# Patient Record
Sex: Male | Born: 1945 | Race: Black or African American | Hispanic: No | Marital: Married | Smoking: Former smoker
Health system: Southern US, Community
[De-identification: ages and names within clinical notes are randomized; demographics above are authoritative.]

## PROBLEM LIST (undated history)

## (undated) DIAGNOSIS — IMO0002 Reserved for concepts with insufficient information to code with codable children: Secondary | ICD-10-CM

## (undated) DIAGNOSIS — F0391 Unspecified dementia with behavioral disturbance: Secondary | ICD-10-CM

## (undated) DIAGNOSIS — I10 Essential (primary) hypertension: Secondary | ICD-10-CM

## (undated) DIAGNOSIS — F03918 Unspecified dementia, unspecified severity, with other behavioral disturbance: Secondary | ICD-10-CM

## (undated) DIAGNOSIS — R1310 Dysphagia, unspecified: Secondary | ICD-10-CM

## (undated) DIAGNOSIS — G609 Hereditary and idiopathic neuropathy, unspecified: Secondary | ICD-10-CM

## (undated) DIAGNOSIS — R270 Ataxia, unspecified: Secondary | ICD-10-CM

## (undated) DIAGNOSIS — R569 Unspecified convulsions: Secondary | ICD-10-CM

## (undated) DIAGNOSIS — Z9989 Dependence on other enabling machines and devices: Secondary | ICD-10-CM

## (undated) DIAGNOSIS — I25810 Atherosclerosis of coronary artery bypass graft(s) without angina pectoris: Secondary | ICD-10-CM

## (undated) DIAGNOSIS — G4733 Obstructive sleep apnea (adult) (pediatric): Secondary | ICD-10-CM

## (undated) DIAGNOSIS — R531 Weakness: Secondary | ICD-10-CM

## (undated) DIAGNOSIS — I639 Cerebral infarction, unspecified: Secondary | ICD-10-CM

## (undated) DIAGNOSIS — I219 Acute myocardial infarction, unspecified: Secondary | ICD-10-CM

## (undated) DIAGNOSIS — R413 Other amnesia: Secondary | ICD-10-CM

## (undated) DIAGNOSIS — E538 Deficiency of other specified B group vitamins: Secondary | ICD-10-CM

## (undated) DIAGNOSIS — G40309 Generalized idiopathic epilepsy and epileptic syndromes, not intractable, without status epilepticus: Secondary | ICD-10-CM

## (undated) DIAGNOSIS — I4891 Unspecified atrial fibrillation: Secondary | ICD-10-CM

## (undated) HISTORY — DX: Other amnesia: R41.3

## (undated) HISTORY — DX: Ataxia, unspecified: R27.0

## (undated) HISTORY — DX: Atherosclerosis of coronary artery bypass graft(s) without angina pectoris: I25.810

## (undated) HISTORY — DX: Reserved for concepts with insufficient information to code with codable children: IMO0002

## (undated) HISTORY — DX: Hereditary and idiopathic neuropathy, unspecified: G60.9

## (undated) HISTORY — DX: Deficiency of other specified B group vitamins: E53.8

## (undated) HISTORY — PX: CORONARY ARTERY BYPASS GRAFT: SHX141

## (undated) HISTORY — DX: Essential (primary) hypertension: I10

## (undated) HISTORY — DX: Generalized idiopathic epilepsy and epileptic syndromes, not intractable, without status epilepticus: G40.309

## (undated) HISTORY — PX: OTHER SURGICAL HISTORY: SHX169

---

## 1998-02-14 ENCOUNTER — Encounter: Admission: RE | Admit: 1998-02-14 | Discharge: 1998-05-15 | Payer: Self-pay | Admitting: Psychiatry

## 1998-05-08 ENCOUNTER — Emergency Department (HOSPITAL_COMMUNITY): Admission: EM | Admit: 1998-05-08 | Discharge: 1998-05-08 | Payer: Self-pay | Admitting: Emergency Medicine

## 1998-09-02 ENCOUNTER — Emergency Department (HOSPITAL_COMMUNITY): Admission: EM | Admit: 1998-09-02 | Discharge: 1998-09-02 | Payer: Self-pay | Admitting: Emergency Medicine

## 2000-05-17 ENCOUNTER — Emergency Department (HOSPITAL_COMMUNITY): Admission: EM | Admit: 2000-05-17 | Discharge: 2000-05-17 | Payer: Self-pay | Admitting: Emergency Medicine

## 2000-06-08 ENCOUNTER — Encounter: Payer: Self-pay | Admitting: Neurosurgery

## 2000-06-10 ENCOUNTER — Inpatient Hospital Stay (HOSPITAL_COMMUNITY): Admission: RE | Admit: 2000-06-10 | Discharge: 2000-06-11 | Payer: Self-pay | Admitting: Neurosurgery

## 2000-06-10 ENCOUNTER — Encounter: Payer: Self-pay | Admitting: Neurosurgery

## 2000-09-15 ENCOUNTER — Encounter: Payer: Self-pay | Admitting: Cardiology

## 2000-09-15 ENCOUNTER — Encounter: Admission: RE | Admit: 2000-09-15 | Discharge: 2000-09-15 | Payer: Self-pay | Admitting: Cardiology

## 2000-09-19 ENCOUNTER — Ambulatory Visit (HOSPITAL_COMMUNITY): Admission: RE | Admit: 2000-09-19 | Discharge: 2000-09-19 | Payer: Self-pay | Admitting: Cardiology

## 2000-09-30 ENCOUNTER — Ambulatory Visit (HOSPITAL_COMMUNITY): Admission: RE | Admit: 2000-09-30 | Discharge: 2000-09-30 | Payer: Self-pay | Admitting: Surgery

## 2000-10-03 ENCOUNTER — Encounter: Payer: Self-pay | Admitting: Surgery

## 2000-10-03 ENCOUNTER — Ambulatory Visit (HOSPITAL_COMMUNITY): Admission: RE | Admit: 2000-10-03 | Discharge: 2000-10-03 | Payer: Self-pay | Admitting: Surgery

## 2000-10-05 ENCOUNTER — Encounter: Payer: Self-pay | Admitting: Surgery

## 2000-10-05 ENCOUNTER — Inpatient Hospital Stay (HOSPITAL_COMMUNITY): Admission: RE | Admit: 2000-10-05 | Discharge: 2000-10-11 | Payer: Self-pay | Admitting: Surgery

## 2000-10-06 ENCOUNTER — Encounter: Payer: Self-pay | Admitting: Surgery

## 2000-10-07 ENCOUNTER — Encounter: Payer: Self-pay | Admitting: Surgery

## 2000-10-31 ENCOUNTER — Encounter: Admission: RE | Admit: 2000-10-31 | Discharge: 2000-10-31 | Payer: Self-pay | Admitting: Cardiology

## 2000-10-31 ENCOUNTER — Encounter: Payer: Self-pay | Admitting: Cardiology

## 2001-01-16 ENCOUNTER — Encounter (INDEPENDENT_AMBULATORY_CARE_PROVIDER_SITE_OTHER): Payer: Self-pay

## 2001-01-16 ENCOUNTER — Ambulatory Visit (HOSPITAL_COMMUNITY): Admission: RE | Admit: 2001-01-16 | Discharge: 2001-01-16 | Payer: Self-pay | Admitting: Gastroenterology

## 2003-05-19 ENCOUNTER — Encounter: Payer: Self-pay | Admitting: Emergency Medicine

## 2003-05-19 ENCOUNTER — Emergency Department (HOSPITAL_COMMUNITY): Admission: EM | Admit: 2003-05-19 | Discharge: 2003-05-19 | Payer: Self-pay | Admitting: Emergency Medicine

## 2003-05-29 ENCOUNTER — Encounter: Admission: RE | Admit: 2003-05-29 | Discharge: 2003-05-29 | Payer: Self-pay | Admitting: Neurology

## 2003-05-29 ENCOUNTER — Encounter: Payer: Self-pay | Admitting: Neurology

## 2003-07-09 ENCOUNTER — Encounter: Admission: RE | Admit: 2003-07-09 | Discharge: 2003-08-14 | Payer: Self-pay | Admitting: Neurosurgery

## 2003-08-20 ENCOUNTER — Encounter: Admission: RE | Admit: 2003-08-20 | Discharge: 2003-11-18 | Payer: Self-pay | Admitting: Neurosurgery

## 2004-05-30 ENCOUNTER — Ambulatory Visit (HOSPITAL_COMMUNITY): Admission: RE | Admit: 2004-05-30 | Discharge: 2004-05-30 | Payer: Self-pay | Admitting: Neurosurgery

## 2005-06-05 ENCOUNTER — Ambulatory Visit (HOSPITAL_COMMUNITY): Admission: RE | Admit: 2005-06-05 | Discharge: 2005-06-05 | Payer: Self-pay | Admitting: Neurosurgery

## 2005-08-19 ENCOUNTER — Emergency Department (HOSPITAL_COMMUNITY): Admission: EM | Admit: 2005-08-19 | Discharge: 2005-08-19 | Payer: Self-pay | Admitting: *Deleted

## 2006-01-03 ENCOUNTER — Ambulatory Visit (HOSPITAL_COMMUNITY): Admission: RE | Admit: 2006-01-03 | Discharge: 2006-01-03 | Payer: Self-pay | Admitting: Neurosurgery

## 2006-06-16 ENCOUNTER — Ambulatory Visit: Payer: Self-pay | Admitting: Family Medicine

## 2007-02-02 ENCOUNTER — Ambulatory Visit: Payer: Self-pay | Admitting: Family Medicine

## 2007-08-24 ENCOUNTER — Ambulatory Visit: Payer: Self-pay | Admitting: Family Medicine

## 2008-04-11 ENCOUNTER — Ambulatory Visit: Payer: Self-pay | Admitting: Family Medicine

## 2008-05-13 ENCOUNTER — Ambulatory Visit: Payer: Self-pay | Admitting: Family Medicine

## 2008-06-24 ENCOUNTER — Ambulatory Visit: Payer: Self-pay | Admitting: Family Medicine

## 2008-11-18 ENCOUNTER — Ambulatory Visit: Payer: Self-pay | Admitting: Family Medicine

## 2008-11-19 ENCOUNTER — Encounter: Admission: RE | Admit: 2008-11-19 | Discharge: 2008-11-19 | Payer: Self-pay | Admitting: Neurology

## 2009-06-17 ENCOUNTER — Ambulatory Visit: Payer: Self-pay | Admitting: Family Medicine

## 2009-07-25 ENCOUNTER — Ambulatory Visit: Payer: Self-pay | Admitting: Family Medicine

## 2010-08-01 ENCOUNTER — Inpatient Hospital Stay (HOSPITAL_COMMUNITY): Admission: EM | Admit: 2010-08-01 | Discharge: 2010-08-07 | Payer: Self-pay | Source: Home / Self Care

## 2010-09-05 ENCOUNTER — Encounter: Payer: Self-pay | Admitting: Neurosurgery

## 2010-10-26 LAB — PHENYTOIN LEVEL, TOTAL
Phenytoin Lvl: 17.2 ug/mL (ref 10.0–20.0)
Phenytoin Lvl: 5.6 ug/mL — ABNORMAL LOW (ref 10.0–20.0)

## 2010-10-26 LAB — BASIC METABOLIC PANEL
BUN: 11 mg/dL (ref 6–23)
BUN: 18 mg/dL (ref 6–23)
BUN: 6 mg/dL (ref 6–23)
BUN: 7 mg/dL (ref 6–23)
BUN: 8 mg/dL (ref 6–23)
CO2: 19 mEq/L (ref 19–32)
CO2: 22 mEq/L (ref 19–32)
CO2: 24 mEq/L (ref 19–32)
CO2: 24 mEq/L (ref 19–32)
CO2: 26 mEq/L (ref 19–32)
Calcium: 8.4 mg/dL (ref 8.4–10.5)
Calcium: 8.6 mg/dL (ref 8.4–10.5)
Calcium: 8.8 mg/dL (ref 8.4–10.5)
Calcium: 9.2 mg/dL (ref 8.4–10.5)
Calcium: 9.6 mg/dL (ref 8.4–10.5)
Chloride: 105 mEq/L (ref 96–112)
Chloride: 105 mEq/L (ref 96–112)
Chloride: 106 mEq/L (ref 96–112)
Chloride: 108 mEq/L (ref 96–112)
Chloride: 108 mEq/L (ref 96–112)
Creatinine, Ser: 0.82 mg/dL (ref 0.4–1.5)
Creatinine, Ser: 0.85 mg/dL (ref 0.4–1.5)
Creatinine, Ser: 0.86 mg/dL (ref 0.4–1.5)
Creatinine, Ser: 0.89 mg/dL (ref 0.4–1.5)
Creatinine, Ser: 1 mg/dL (ref 0.4–1.5)
GFR calc Af Amer: >60 mL/min (ref >60)
GFR calc Af Amer: >60 mL/min (ref >60)
GFR calc Af Amer: >60 mL/min (ref >60)
GFR calc Af Amer: >60 mL/min (ref >60)
GFR calc Af Amer: >60 mL/min (ref >60)
GFR calc non Af Amer: >60 mL/min (ref >60)
GFR calc non Af Amer: >60 mL/min (ref >60)
GFR calc non Af Amer: >60 mL/min (ref >60)
GFR calc non Af Amer: >60 mL/min (ref >60)
GFR calc non Af Amer: >60 mL/min (ref >60)
Glucose, Bld: 104 mg/dL — ABNORMAL HIGH (ref 70–99)
Glucose, Bld: 110 mg/dL — ABNORMAL HIGH (ref 70–99)
Glucose, Bld: 135 mg/dL — ABNORMAL HIGH (ref 70–99)
Glucose, Bld: 164 mg/dL — ABNORMAL HIGH (ref 70–99)
Glucose, Bld: 92 mg/dL (ref 70–99)
Potassium: 3.3 mEq/L — ABNORMAL LOW (ref 3.5–5.1)
Potassium: 3.5 mEq/L (ref 3.5–5.1)
Potassium: 3.7 mEq/L (ref 3.5–5.1)
Potassium: 3.7 mEq/L (ref 3.5–5.1)
Potassium: 3.9 mEq/L (ref 3.5–5.1)
Sodium: 135 mEq/L (ref 135–145)
Sodium: 135 mEq/L (ref 135–145)
Sodium: 137 mEq/L (ref 135–145)
Sodium: 138 mEq/L (ref 135–145)
Sodium: 140 mEq/L (ref 135–145)

## 2010-10-26 LAB — LIPID PANEL
Cholesterol: 169 mg/dL (ref 0–200)
HDL: 44 mg/dL (ref >39)
LDL Cholesterol: 106 mg/dL — ABNORMAL HIGH (ref 0–99)
Total CHOL/HDL Ratio: 3.8 RATIO
Triglycerides: 93 mg/dL (ref <150)
VLDL: 19 mg/dL (ref 0–40)

## 2010-10-26 LAB — PHENOBARBITAL LEVEL: Phenobarbital: 23 ug/mL (ref 15.0–40.0)

## 2010-10-26 LAB — CBC
HCT: 40.8 % (ref 39.0–52.0)
HCT: 40.9 % (ref 39.0–52.0)
Hemoglobin: 13.7 g/dL (ref 13.0–17.0)
Hemoglobin: 14 g/dL (ref 13.0–17.0)
MCH: 30.5 pg (ref 26.0–34.0)
MCH: 30.9 pg (ref 26.0–34.0)
MCHC: 33.5 g/dL (ref 30.0–36.0)
MCHC: 34.3 g/dL (ref 30.0–36.0)
MCV: 90.1 fL (ref 78.0–100.0)
MCV: 91.1 fL (ref 78.0–100.0)
Platelets: 173 10*3/uL (ref 150–400)
Platelets: 185 10*3/uL (ref 150–400)
RBC: 4.49 MIL/uL (ref 4.22–5.81)
RBC: 4.53 MIL/uL (ref 4.22–5.81)
RDW: 13.5 % (ref 11.5–15.5)
RDW: 13.5 % (ref 11.5–15.5)
WBC: 8.7 10*3/uL (ref 4.0–10.5)
WBC: 9.7 10*3/uL (ref 4.0–10.5)

## 2010-10-26 LAB — CK TOTAL AND CKMB (NOT AT ARMC)
CK, MB: 1.4 ng/mL (ref 0.3–4.0)
Relative Index: INVALID (ref 0.0–2.5)
Total CK: 76 U/L (ref 7–232)

## 2010-10-26 LAB — TSH: TSH: 1.327 u[IU]/mL (ref 0.350–4.500)

## 2010-10-26 LAB — TROPONIN I: Troponin I: 0.01 ng/mL (ref 0.00–0.06)

## 2011-01-01 NOTE — Op Note (Signed)
[place]. [facility]  Patient:    [name], [name]                      MRN: [id number] Proc. Date: 10/05/00 Adm. Date:  [id number] Attending:  Cleatrice Burke CC:         [name]. Little, M.D.  Cath lab   Operative Report  PREOPERATIVE DIAGNOSIS:  Three-vessel coronary artery disease with positive Cardiolite stress test.  POSTOPERATIVE DIAGNOSIS:  Three-vessel coronary artery disease with positive Cardiolite stress test.  OPERATION PERFORMED:  Median sternotomy, extracorporeal circulation, coronary artery bypass graft surgery x 5 using a left internal mammary artery graft to the left anterior descending coronary artery, with a saphenous vein graft to the diagonal branch of the left anterior descending, a sequential saphenous vein graft to the posterior descending coronary artery and the posterolateral branch of the right coronary artery, and a saphenous vein graft to the obtuse marginal branch of the left circumflex coronary artery.  SURGEON:  Alleen Borne, M.D.  ASSISTANT:  Lissa Hoard, P.A.  ANESTHESIA:  General endotracheal.  INDICATIONS FOR PROCEDURE:  The patient is a 65 year old gentleman with a history of severe hypertension, hypercholesterolemia, a positive family history of heart disease, and heavy active smoking who had had a previous history of stroke in 1995 and two small strokes in 1999 that were felt to be of hypertensive origin.  He was referred for cardiologic evaluation because of an abnormal electrocardiogram and increasing dyspnea on exertion.  A Cardiolite stress test showed mild lateral ischemia.  His ejection fraction was normal. He underwent cardiac catheterization on September 19, 2000 by [name]. Little, M.D. which showed three-vessel coronary artery disease.  The LAD had 70% proximal stenosis at the take-off of the first diagonal branch.  The left circumflex was a small nondominant vessel with two obtuse  marginal vessels that were small and diffusely diseased.  The right coronary artery had an anomolous take off from the aorta.  This was a dominant vessel that had 80% proximal stenosis over a long area and then a 70% midvessel stenosis.  The posterior descending branch was occluded and filled by collaterals from teh left.  The posterolateral branch was diffusely diseased.  Left ventricular ejection fraction was 60%.  There was no mitral regurgitation.  There was mild dilatation of the aortic root and trace aortic insufficiency.  A preoperative echocardiogram was obtained and this showed some left ventricular hypertrophy but no significant aortic valvular stenosis or insufficiency.  There was no mitral regurgitation.  Preoperative pulmonary function testing was also obtained which showed moderate COPD.  After review of all these studies and examination of the patient it was felt that coronary artery bypass graft surgery was the best treatment.  I discussed the operative procedure with the patient and his wife including alternatives to surgery, benefits, and risks including bleeding, possible blood transfusion, infection, stroke, myocardial infarction, and death.  They understood and agreed to proceed with surgery.  DESCRIPTION OF PROCEDURE:  The patient was taken to the operating room and placed on the table in supine position.  After induction of general endotracheal anesthesia, a Foley catheter was placed in the bladder using sterile technique.  Then the chest, abdomen and both lower extremities were prepped and draped in the usual sterile manner.  The chest was entered through a median sternotomy incision and the pericardium opened in the midline. Examination of the heart showed good ventricular contractility.  The ascending  aorta had no palpable plaques in it.  Then the left internal mammary artery was harvested from the chest wall as a pedicle graft.  This was a medium-caliber vessel  with excellent blood flow through it.  At the same time a segment of greater saphenous vein was harvested from the right leg and this vein was of medium size and good quality.  Then the patient was heparinized and when an adequate activated clotting time was achieved, the distal ascending aorta was cannulated using a 6.5 mm aortic cannula for arterial inflow.  Venous outflow was achieved using a two-stage venous cannula through the right atrial appendage.  An antegrade cardioplegia and vent cannula was inserted into the aortic root.  The patient was placed on cardiopulmonary bypass and the distal coronary arteries were identified.  He had diffuse three-vessel coronary artery disease with plaque extending out into the distal vessels.  Then the aorta was cross-clamped and 500 cc of cold blood antegrade cardioplegia was administered in the aortic root with quick arrest of the heart.  Systemic hypothermia to 20 degrees centigrade and topical hypothermia with iced saline was used.  A temperature probe was placed in the septum and an insulating pad in the pericardium.  The first distal anastomosis was performed to the posterior descending branch of the right coronary artery.  The internal diameter was about about 1.5 mm. The artery was diffusely diseased especially in its proximal and midportions and had to be grafted quite distally.  The conduit used was a segment of greater saphenous vein.  Anastomosis was performed in end-to-side manner using   continuous 7-0 Prolene suture.  The flow was measured through the graft and was excellent.  The second distal anastomosis was performed to the posterolateral branch. The internal diameter was about 1.6 mm.  The conduit used was a the same segment of greater saphenous vein.  The anastomosis was performed in a sequential end-to-side manner using continuous 7-0 Prolene suture.  This vessel was also grafted quite distally due to proximal and midvessel  plaque.  The flow was  measured through the graft and was excellent.  Then another dose of cardioplegia was given down the vein graft and into the aortic root.  The third distal anastomosis was performed to the obtuse marginal branch.  The internal diameter in this area was about 1.5 mm.  The conduit used was a second segment of the greater saphenous vein.  The anastomosis was performed in an end-to-side manner using continuous 7-0 Prolene suture.  The flow was measured through the graft and was excellent.  The fourth distal anastomosis was performed to the diagonal branch.  The internal diameter was 1.6 mm.  The conduit used was the third segment of greater saphenous vein.  The anastomosis was performed in an end-to-side manner using continuous 7-0 Prolene suture.  The flow was measured through the graft and was excellent.  Then another dose of cardioplegia was given down the vein grafts and in the aortic root.  The fifth distal anastomosis was performed to the distal portion of the left anterior descending coronary artery.  The internal diameter was about 1.75 mm. The conduit used was the left internal mammary artery graft and this was brought through an opening in the left pericardium anterior to the phrenic nerve.  It was anastomosed to the LAD in end-to-side manner using continuous 8-0 Prolene suture.  The pedicle was tacked to the epicardium with 6-0 Prolene sutures.  The patient was rewarmed to 37 degrees  and the clamp removed from the mammary pedicle.  There was rapid warming of the ventricular septum and return of spontaneous ventricular fibrillation.  The crossclamp was removed with a time of 58 minutes and the patient defibrillated into sinus rhythm.  A partial occlusion clamp was placed on the aortic root and the three proximal vein graft anastomoses were performed in end-to-side manner using continuous 6-0 Prolene suture.  The clamp was removed and the vein grafts  deaired and the clamps removed from them.  The proximal and distal anastomoses appeared hemostatic and line of the grafts satisfactory.  Graft markers were placed around the proximal anastomoses.  Two temporary right ventricular and right atrial pacing wires were placed and brought out through the skin.  When the patient had rewarmed to 37 degrees centigrade, he was weaned from cardiopulmonary bypass on no inotropic agents.  Total bypass time was 102 minutes.  Cardiac function appeared excellent with a cardiac output of 5L per minute.  Protamine was given and the venous and aortic cannulas were removed without difficulty.  Hemostasis was achieved.  Three chest tubes were placed with a tube in the posterior pericardium and one in the left pleural space and one in the anterior mediastinum.  The pericardium was reapproximated over the heart.  The sternum was closed with #6 stainless steel wires.  The fascia was closed with continuous #1 Vicryl suture.  The subcutaneous tissues were closed using continuous 2-0 Vicryl and the skin with 3-0 Vicryl subcuticular closure.  The lower extremity vein harvest site was closed in layers in a similar manner.  The sponge, needle and instrument counts were correct according to the scrub nurse.  A dry sterile dressing was applied over the incisions and around the chest tubes which were hooked to Pleur-Evac suction.  The patient remained hemodynamically stable and was transported to the SICU in guarded but stable condition. DD:  10/05/00 TD:  10/05/00 Job: [id number]

## 2011-01-01 NOTE — H&P (Signed)
NAMECELVIN, TANEY NO.:  [id number]   MEDICAL RECORD NO.:  [id number]          PATIENT TYPE:  INP   LOCATION:  0104                         FACILITY:  [facility]   PHYSICIAN:  Danae Chen, M.D.DATE OF BIRTH:  07/30/1946   DATE OF ADMISSION:  08/19/2005  DATE OF DISCHARGE:                                HISTORY & PHYSICAL   PRIMARY CARE PHYSICIAN:  Dr. Sharlot Gowda   PRIMARY CARDIOLOGIST:  Dr. Caprice Kluver   CHIEF COMPLAINT:  Fever and shortness of breath.   HISTORY OF PRESENT ILLNESS:  The patient is a 65 year old gentleman with  multiple medical problems who is brought in by his wife for complaints of  fever and shortness of breath and increasing dyspnea on exertion over the  past 3-4 days. The patient currently says that he does not feel badly but  does report that he has had some mildly-productive cough. He denies any  chest pain, no nausea, no vomiting, no recent weight gain or weight loss, no  headache, no chills.   PAST MEDICAL HISTORY:  Significant for coronary artery disease status post  CABG, COPD, congestive heart failure, peripheral vascular disease status  post CVA. Prior surgeries include back surgery and CABG as noted. Also has  dementia and history of seizure disorder.   SOCIAL HISTORY:  He is married. Denies any recent tobacco or alcohol use.   FAMILY HISTORY:  Denies any significant family medical history.   REVIEW OF SYSTEMS:  Per the HPI.   ALLERGIES:  No known drug allergies.   MEDICATIONS:  1.  Dilantin 100 mg p.o. q.i.d.  2.  Phenobarbital 65 mg p.o. b.i.d.  3.  Folic acid 1 mg p.o. daily.  4.  Lisinopril 40 mg p.o. daily.  5.  Plavix 75 mg p.o. daily.  6.  Oxybutynin 5 mg p.o. b.i.d.  7.  Donepezil 10 mg one p.o. daily.  8.  Hydrochlorothiazide 25 mg p.o. daily.  9.  Nifedipine 60 mg p.o. daily.  10. Calcium with vitamin D 600 mg p.o. daily.  11. Namenda 10 mg p.o. b.i.d.  12. Zocor 40 mg p.o. daily.   PHYSICAL  EXAMINATION:  VITAL SIGNS:  On admission, temperature is 102.2,  blood pressure 144/92, pulse 121, respirations 22, O2 saturation 91% on room  air.  GENERAL:  He is in no acute distress at present, speaking in complete  sentences, easily arousable and alert and awake.  HEENT:  Oropharynx is mildly erythematous. Neck is supple but there is no  significant lymphadenopathy. Head is atraumatic, normocephalic. Pupils are  equal and reactive.  HEART:  Rate is regular with normal S1, S2.  LUNGS:  Show some scattered rales in the upper lobes but good air movement.  He does have decreased breath sounds in the left lower lobe.  ABDOMEN:  Slightly distended but soft, nontender, with active bowel sounds.  He has no peripheral edema, 2+ dorsalis pedis pulses. He follows commands.  He has a nonfocal neurological exams. He moves all four extremities.   PERTINENT LABORATORY DATA:  White count of 15,400; hemoglobin of 13.2;  platelets of 213. Potassium of 3.7, sodium 136, BUN 13, creatinine 1.0. Two-  view diagnostic chest shows left lower lobe air space disease compatible  with pneumonia. The patient has received a dose of Rocephin and Zithromax  here in the ED. Blood cultures have also been drawn.   IMPRESSION:  A 65 year old gentleman with multiple medical problems who now  presents with fever, chills, nausea, and leukocytosis, and chest x-ray  compatible with left lower extremity pneumonia.   PLAN:  We will admit the patient for treatment of his pneumonia with IV  antibiotics. Admit to a telemetry bed. Continue his home medications.  Hydration as needed. Follow up with his blood cultures and repeat chest x-  ray if no clinical improvement in the next 24-48 hours.      Danae Chen, M.D.  Electronically Signed     RLK/MEDQ  D:  08/19/2005  T:  08/19/2005  Job:  [id number]

## 2011-01-01 NOTE — Discharge Summary (Signed)
[place]. [facility]  Patient:    [name], [name]                      MRN: [id number] Adm. Date:  [id number] Disc. Date: [id number] Attending:  Cleatrice Burke Dictator:   Lissa Merlin, P.A. CC:         [name]. Little, M.D.  Ronnald Nian, M.D.   Discharge Summary  DATE OF BIRTH:  [birth year]  ADMISSION DIAGNOSES: 1. Three vessel coronary artery disease. 2. Positive Cardiolite stress test.  PAST MEDICAL HISTORY: 1. Abnormal Cardiolite study recently. 2. Three vessel coronary artery disease, ejection fraction of 60% per cardiac    catheterization on September 19, 2000. 3. Hypertension. 4. Cerebrovascular accidents in 1995 and 1999 with minimal residual deficits. 5. Chronic obstructive pulmonary disease. 6. Back surgery. 7. Remote seizure disorder. 8. Hypercholesterolemia. 9. Smoking.  PROCEDURE: CABG x 5 on October 05, 2000, with the following grafts; LIMA to LAD, saphenuos vein graft to OM, saphenous vein graft to diagonal, sequential saphenuos vein graft from PD to PL.  DISCHARGE DIAGNOSES: 1. Three vessel coronary artery disease. 2. Brief postoperative atrial fibrillation, resolved. 3. Postoperative confusion, resolved.  BRIEF HISTORY: [name] is a 65 year old male who was initially evaluated by Dr. Laneta Simmers at the office on September 27, 2000, after being referred for severe three vessel coronary artery disease.  He is also notable for history of strokes in 1995 and 1999 with left hemiparesis which resolved with physical therapy.  After reviewing the catheterization data and examining [name], Dr. Laneta Simmers agreed CABG was best treatment alternative.  Risks, benefits, details, and alternatives to surgery were discussed and it was agreed to proceed.  HOSPITAL COURSE:  [name] came into the hospital for the elective procedure on October 05, 2000.  There were no complications.  He was transferred to SICU in stable condition.   Postoperative [name] had brief episode of atrial fibrillation which resolved with digoxin.  He also exhibited some confusion which resolved.  He required special attention with cardiac rehabilitation phase I and his walking because of unsteadiness.  This also resolved.  He had no other major postoperative complications.  By October 11, 2000, postoperative day #6, [name] was doing very well.  He was afebrile and vital signs were stable.  He was in normal sinus rhythm.  He was back to his preoperative weight.  His confusion was gone.  He was at his baseline with regard to his walking and mentation.  He was able to ambulate down the hall with no assistance with a steady gait.  Wounds were healing well.  He was deemed suitable and stable for discharge home and was discharged.  MEDICATIONS:  1. Enteric-coated aspirin 325 mg one p.o. q.d.  2. Phenobarbital 60 mg one p.o. b.i.d.  3. Dilantin 100 mg one p.o. q.i.d.  4. Lopressor 50 mg tablet 1/2 tablet p.o. q.12h.  5. Digoxin 0.125 mg one p.o. q.d.  6. Wellbutrin 150 mg one p.o. b.i.d.  7. Albuterol MDI two puffs q.i.d.  8. Ultram 50 mg one to two p.o. q.4-6h. p.r.n.  9. Lasix 40 mg one p.o. q.d. x 5 days. 10. KCL 20 mEq one p.o. q.d. x 5 days.  ALLERGIES:  No known drug allergies.  CONDITION ON DISCHARGE:  Stable and improved.  DISCHARGE INSTRUCTIONS:  [name] was told to do no driving, no heavy lifting, or strenuous activity.  He was told to  walk daily and use his incentive spirometer daily.  He was told he could shower and to use mild soap and water only on his wounds and to call the office if he noticed anything unusual with his wounds, such as increasing redness, swelling, drainage, or fever.  He was told to get a chest x-ray at Dr. Darrol Poke office two weeks after discharge and to bring it with him when he saw Dr. Laneta Simmers.  FOLLOW-UP: 1. Dr. Darrol Poke office two weeks after discharge. 2. Dr. Laneta Simmers three weeks after  discharge. DD:  10/24/00 TD:  10/25/00 Job: [id number]

## 2011-01-01 NOTE — Cardiovascular Report (Signed)
[place]. [facility]  Patient:    GREER, KOEPPEN                      MRN: [id number] Proc. Date: 09/19/00 Adm. Date:  [id number] Attending:  Loreli Dollar CC:         Ronnald Nian, M.D.             Cardiac Catheterization Lab                        Cardiac Catheterization  INDICATIONS FOR TEST:  Mr. Kulish is a 65 year old male who has severe hypertension and has had a hypertensive CVA in the distant past.  He has had increasing episodes of chest pressure and dyspnea on exertion.  Nuclear study showed apical and anterolateral ischemia.  PROCEDURES: 1. Left heart catheterization. 2. Selective left and right coronary arteriography. 3. Ventriculography in the RAO projection. 4. Aortic root, aortogram.  CARDIOLOGIST:  [name]. Little, M.D.  COMPLICATIONS:  None.  DESCRIPTION OF PROCEDURE:  The patient was prepped and draped in the usual sterile fashion exposing the right groin, applying local anesthetic with 1% Xylocaine.  A Seldinger technique was employed and 6-French introducer sheath placed in the right femoral artery.  Selective left coronary arteriography was performed.  Engagement of the right coronary artery was difficult, but the right coronary artery came off posteriorly and, after multiple attempts and multiple catheters being used, Dr. Katrinka Blazing was finally able to cannulate the right with a multipurpose catheter.  An aortogram at the level of renal arteries was performed to rule out renal artery stenosis because of his severe hypertension.  An aortic root was performed because of dilatation of the aortic root, and ventriculography in the RAO projection was performed.  RESULTS:  I.   HEMODYNAMIC MONITORING:  Central aortic pressure was 133/87, left      ventricular pressure was 133/19 with no aortic valve gradient at the      time of pullback.  II.  VENTRICULOGRAPHY: Ventriculography in the RAO projection revealed normal      left  ventricular hypertrophy.  The ejection fraction was 60%.   End      diastolic pressure was 15.  No mitral regurgitation was noted.  III. 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  It showed the right coronary artery coming off slightly      posteriorly.  IV.  AORTOGRAM:  Aortogram at the level of the renal arteries showed no      evidence of renal artery stenosis, mild irregularities of the distal      aorta just above the bifurcation.  III. CORONARY ARTERIOGRAPHY:  There was calcification on fluoroscopy in      the proximal LAD.      1. Left main normal.      2. LAD: The proximal segment of the LAD was dilated and ectatic.         Distal to this was a bifurcation of the diagonal.  There was a long         70% area of narrowing that involved the ostium of the first diagonal         in the LAD itself.  The distal vessel had mild irregularities.      3. Circumflex: The Circumflex gave rise to two small OM vessels.  OM-2  was diffusely diseased.      4. Right coronary artery.  The right coronary artery came off         posteriorly.  It was a dominant vessel, had a long 80% proximal area         of narrowing, a mid 70% area of narrowing, and the PDA was diffusely         diseased.  CONCLUSIONS:  At this point, I will ask CVTS to evaluate the patient for consideration of revascularization. DD:  09/19/00 TD:  09/19/00 Job: [id number]

## 2011-01-01 NOTE — Procedures (Signed)
[facility]  Patient:    [name], [name]                      MRN: [id number] Proc. Date: 01/16/01 Adm. Date:  [id number] Attending:  Orland Mustard CC:         Ronnald Nian, M.D.   Procedure Report  PROCEDURE:  Colonoscopy and coagulation of polyps.  MEDICATIONS:  Fentanyl 50 mcg, Versed 5 mg IV.  SCOPE:  Pediatric video colonoscope.  INDICATIONS FOR PROCEDURE:  The patient had a sigmoidoscopy with a small polyp found. This is done to remove them.  DESCRIPTION OF PROCEDURE:  The procedure had been explained to the patient and consent obtained. With the patient in the left lateral decubitus position, the Olympus pediatric video colonoscope was inserted and advanced under direct visualization. The prep was excellent. We were able to reach the cecum without difficulty. The scope was withdrawn and cecum, ascending colon, hepatic flexure, transverse colon, splenic flexure, descending and sigmoid colon were seen well. In the sigmoid colon, a 1/3 cm sessile polyp was cauterized. In the distal sigmoid colon, a 1/2 cm sessile polyp was cauterized. These were both placed in a single jar. The scope was withdrawn, the patient tolerated the procedure well.  ASSESSMENT:  Small polyps in the sigmoid colon cauterized.  PLAN:  Check path, routine polypectomy instructions. DD:  01/16/01 TD:  01/17/01 Job: [id number]
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
## 2011-01-01 NOTE — Op Note (Signed)
[place]. [facility]  Patient:    [name], [name]                      MRN: [id number] Proc. Date: 06/10/00 Adm. Date:  [id number] Disc. Date: [id number] Attending:  Sandi Raveling                           Operative Report  PREOPERATIVE DIAGNOSIS:  Far lateral herniated disk L3-4 left with radiculopathy, spondylosis, degenerative disk disease.  POSTOPERATIVE DIAGNOSIS:  Far lateral herniated disk L3-4 left with radiculopathy, spondylosis, degenerative disk disease.  PROCEDURE:  Far lateral microdiskectomy L3-4 left with microdissection.  SURGEON:  Danae Orleans. Venetia Maxon, M.D.  ASSISTANT:  Hewitt Shorts, M.D.  ANESTHESIA:  General endotracheal anesthesia.  ESTIMATED BLOOD LOSS:  75 cc  COMPLICATIONS:  None.  DISPOSITION:  To recovery.  INDICATIONS:  [name] is a 65 year old man with a left L3 radiculopathy with an MRI demonstrating a far lateral disk herniation at the L3-4 level on the left.  It was elected to take him to surgery for microdiskectomy.  PROCEDURE:  [name] was brought to the operating room.  Following satisfactory and uncomplicated induction of general endotracheal anesthesia and placed on intravenous lines he was placed in a prone position on the Wilson frame.  His low back was then prepped and draped in the usual sterile fashion.  A spinal needle was placed for preoperative localization.  An incision was made in the midline from approximately L2-3 to L4 overlying the L3-4 spinous processes and carried to the lumbar dorsal fascia in the midline. A fascial flap was created on the left using a 10 blade and Metzenbaum scissors and this was reflected medially.  Subperiosteal dissection was performed exposing the L3-4 interspace and the L2-3 and L3-4 facet joints. The transverse process of L3 was identified.  A self-retaining retractor was placed.  Intraoperative x-ray confirmed correct orientation with the probe at the  pedicle of L3.  Using the Midas Rex drill the pars interarticularis was thinned extraforaminally as well as the superior portion of the L3 facet joints.  This was then completed with Kerrison rongeur and the remainder of the case was performed under microscopic visualization.  Upon removing the outer rim of the pars interarticularis the L3 nerve root was identified as it coursed extraforaminally.  This was then followed extraforaminally. Superficial veins were cauterized with bipolar electrocautery.  Overlying muscle was removed with Kerrison rongeur.  At the level of the L3-4 interspace there was a focal disk herniation directly abutting the L3 nerve root in the position of the dorsal root ganglion.  The nerve root was carefully retracted laterally and the disk space was incised and multiple fragments of free disk material were removed.  This led to significant decompression of the L3 nerve root.  The wound was then copiously irrigated with bacitracin and saline.  The spondylitic edge of the inferior aspect of L3 was removed with a osteophyte removing tool.  The nerve root was felt to be well decompressed.  There was excellent hemostasis which was accomplished using bipolar electrocautery and Gelfoam soaked in thrombin.  The nerve root was felt to be well decompressed and a coronary dilator probe was easily passed beneath it and along its course extraforaminally.  The nerve root was then bathed in 2 cc of fentanyl and 80 mg of Depo-Medrol.  The self-retaining retractor was removed.  The microscope  was taken out of the field.  The fascial flap was reapproximated with 0 Vicryl sutures. Subcutaneous tissues were reapproximated with 2-0 Vicryl interrupted and 3-0 Vicryl interrupted inverted sutures were used to reapproximate the skin edges.  The wound was dressed with benzoin and Steri-Strips, Telfa gauze, and tape. The patient was extubated in the operating room and taken to the recovery  room in stable, satisfactory condition having tolerated the procedure well.  Counts were correct at the end of the case. DD:  06/10/00 TD:  06/11/00 Job: [id number]

## 2011-07-02 ENCOUNTER — Encounter (HOSPITAL_COMMUNITY): Payer: Self-pay | Admitting: Certified Registered Nurse Anesthetist

## 2011-07-02 ENCOUNTER — Other Ambulatory Visit: Payer: Self-pay

## 2011-07-02 ENCOUNTER — Emergency Department (HOSPITAL_COMMUNITY): Payer: Medicare Other

## 2011-07-02 ENCOUNTER — Inpatient Hospital Stay (HOSPITAL_COMMUNITY)
Admission: EM | Admit: 2011-07-02 | Discharge: 2011-07-07 | DRG: 064 | Disposition: A | Discharge status: Skilled Nursing Facility | Payer: Medicare Other | Attending: Internal Medicine | Admitting: Internal Medicine

## 2011-07-02 DIAGNOSIS — Z951 Presence of aortocoronary bypass graft: Secondary | ICD-10-CM

## 2011-07-02 DIAGNOSIS — R5381 Other malaise: Secondary | ICD-10-CM | POA: Diagnosis present

## 2011-07-02 DIAGNOSIS — G40909 Epilepsy, unspecified, not intractable, without status epilepticus: Secondary | ICD-10-CM | POA: Diagnosis present

## 2011-07-02 DIAGNOSIS — R569 Unspecified convulsions: Secondary | ICD-10-CM

## 2011-07-02 DIAGNOSIS — I10 Essential (primary) hypertension: Secondary | ICD-10-CM | POA: Diagnosis present

## 2011-07-02 DIAGNOSIS — F0390 Unspecified dementia without behavioral disturbance: Secondary | ICD-10-CM | POA: Diagnosis present

## 2011-07-02 DIAGNOSIS — Z9119 Patient's noncompliance with other medical treatment and regimen: Secondary | ICD-10-CM

## 2011-07-02 DIAGNOSIS — Z91199 Patient's noncompliance with other medical treatment and regimen due to unspecified reason: Secondary | ICD-10-CM

## 2011-07-02 DIAGNOSIS — I4891 Unspecified atrial fibrillation: Secondary | ICD-10-CM | POA: Diagnosis present

## 2011-07-02 DIAGNOSIS — Z8673 Personal history of transient ischemic attack (TIA), and cerebral infarction without residual deficits: Secondary | ICD-10-CM

## 2011-07-02 DIAGNOSIS — Z87891 Personal history of nicotine dependence: Secondary | ICD-10-CM

## 2011-07-02 DIAGNOSIS — Z7902 Long term (current) use of antithrombotics/antiplatelets: Secondary | ICD-10-CM

## 2011-07-02 DIAGNOSIS — I6350 Cerebral infarction due to unspecified occlusion or stenosis of unspecified cerebral artery: Secondary | ICD-10-CM

## 2011-07-02 DIAGNOSIS — J9600 Acute respiratory failure, unspecified whether with hypoxia or hypercapnia: Secondary | ICD-10-CM

## 2011-07-02 DIAGNOSIS — I639 Cerebral infarction, unspecified: Secondary | ICD-10-CM | POA: Diagnosis present

## 2011-07-02 DIAGNOSIS — E785 Hyperlipidemia, unspecified: Secondary | ICD-10-CM | POA: Diagnosis present

## 2011-07-02 DIAGNOSIS — R000 Tachycardia, unspecified: Secondary | ICD-10-CM | POA: Diagnosis present

## 2011-07-02 DIAGNOSIS — Z6825 Body mass index (BMI) 25.0-25.9, adult: Secondary | ICD-10-CM

## 2011-07-02 DIAGNOSIS — Z7982 Long term (current) use of aspirin: Secondary | ICD-10-CM

## 2011-07-02 DIAGNOSIS — I2510 Atherosclerotic heart disease of native coronary artery without angina pectoris: Secondary | ICD-10-CM | POA: Diagnosis present

## 2011-07-02 HISTORY — DX: Cerebral infarction, unspecified: I63.9

## 2011-07-02 HISTORY — DX: Unspecified convulsions: R56.9

## 2011-07-02 HISTORY — DX: Essential (primary) hypertension: I10

## 2011-07-02 LAB — PHENYTOIN LEVEL, TOTAL: Phenytoin Lvl: 3.8 ug/mL — ABNORMAL LOW (ref 10.0–20.0)

## 2011-07-02 LAB — CBC
HCT: 39.1 % (ref 39.0–52.0)
Hemoglobin: 13.3 g/dL (ref 13.0–17.0)
Hemoglobin: 13.3 g/dL (ref 13.0–17.0)
MCH: 30.6 pg (ref 26.0–34.0)
MCHC: 33.9 g/dL (ref 30.0–36.0)
MCV: 90.1 fL (ref 78.0–100.0)
RBC: 4.35 MIL/uL (ref 4.22–5.81)
WBC: 15.8 10*3/uL — ABNORMAL HIGH (ref 4.0–10.5)

## 2011-07-02 LAB — BASIC METABOLIC PANEL
BUN: 12 mg/dL (ref 6–23)
CO2: 25 mEq/L (ref 19–32)
Calcium: 9.3 mg/dL (ref 8.4–10.5)
Creatinine, Ser: 1.01 mg/dL (ref 0.50–1.35)
Glucose, Bld: 90 mg/dL (ref 70–99)

## 2011-07-02 LAB — CREATININE, SERUM
GFR calc Af Amer: >90 mL/min (ref >90)
GFR calc non Af Amer: 89 mL/min — ABNORMAL LOW (ref >90)

## 2011-07-02 LAB — URINALYSIS, ROUTINE W REFLEX MICROSCOPIC
Bilirubin Urine: NEGATIVE
Glucose, UA: 100 mg/dL — AB
Ketones, ur: NEGATIVE mg/dL
Protein, ur: >300 mg/dL — AB

## 2011-07-02 LAB — POCT I-STAT 3, ART BLOOD GAS (G3+)
TCO2: 25 mmol/L (ref 0–100)
pH, Arterial: 7.354 (ref 7.350–7.450)

## 2011-07-02 LAB — DIFFERENTIAL
Eosinophils Absolute: 0.1 10*3/uL (ref 0.0–0.7)
Eosinophils Relative: 2 % (ref 0–5)
Lymphs Abs: 1.5 10*3/uL (ref 0.7–4.0)
Monocytes Relative: 6 % (ref 3–12)

## 2011-07-02 LAB — PHENOBARBITAL LEVEL
Phenobarbital: 17.5 ug/mL (ref 15.0–40.0)
Phenobarbital: 19.3 ug/mL (ref 15.0–40.0)

## 2011-07-02 LAB — URINE MICROSCOPIC-ADD ON

## 2011-07-02 MED ORDER — CHLORHEXIDINE GLUCONATE 0.12 % MT SOLN
15.0000 mL | Freq: Two times a day (BID) | OROMUCOSAL | Status: DC
  Administered 2011-07-03 – 2011-07-07 (×8): 15 mL via OROMUCOSAL
  Filled 2011-07-02 (×10): qty 15

## 2011-07-02 MED ORDER — ALBUTEROL SULFATE HFA 108 (90 BASE) MCG/ACT IN AERS
4.0000 | INHALATION_SPRAY | RESPIRATORY_TRACT | Status: DC | PRN
  Filled 2011-07-02: qty 6.7

## 2011-07-02 MED ORDER — ETOMIDATE 2 MG/ML IV SOLN
15.0000 mg | Freq: Once | INTRAVENOUS | Status: AC
  Administered 2011-07-02: 15 mg via INTRAVENOUS

## 2011-07-02 MED ORDER — SUCCINYLCHOLINE CHLORIDE 20 MG/ML IJ SOLN
INTRAMUSCULAR | Status: AC
  Filled 2011-07-02: qty 5

## 2011-07-02 MED ORDER — LORAZEPAM 2 MG/ML IJ SOLN
4.0000 mg | Freq: Once | INTRAMUSCULAR | Status: AC
  Administered 2011-07-02: 4 mg via INTRAVENOUS

## 2011-07-02 MED ORDER — HEPARIN SODIUM (PORCINE) 5000 UNIT/ML IJ SOLN
5000.0000 [IU] | Freq: Three times a day (TID) | INTRAMUSCULAR | Status: DC
  Administered 2011-07-02 – 2011-07-07 (×13): 5000 [IU] via SUBCUTANEOUS
  Filled 2011-07-02 (×17): qty 1

## 2011-07-02 MED ORDER — LIDOCAINE HCL (CARDIAC) 20 MG/ML IV SOLN
INTRAVENOUS | Status: AC
  Filled 2011-07-02: qty 5

## 2011-07-02 MED ORDER — SODIUM CHLORIDE 0.9 % IV SOLN
2.0000 mg/h | INTRAVENOUS | Status: DC
  Administered 2011-07-03: 1 mg/h via INTRAVENOUS
  Filled 2011-07-02: qty 10

## 2011-07-02 MED ORDER — MIDAZOLAM HCL 2 MG/2ML IJ SOLN
INTRAMUSCULAR | Status: AC
  Filled 2011-07-02: qty 4

## 2011-07-02 MED ORDER — SODIUM CHLORIDE 0.9 % IV SOLN
50.0000 ug/h | INTRAVENOUS | Status: DC
  Filled 2011-07-02: qty 50

## 2011-07-02 MED ORDER — SUCCINYLCHOLINE CHLORIDE 20 MG/ML IJ SOLN
125.0000 mg | Freq: Once | INTRAMUSCULAR | Status: AC
  Administered 2011-07-02: 125 mg via INTRAVENOUS

## 2011-07-02 MED ORDER — MIDAZOLAM BOLUS VIA INFUSION
1.0000 mg | INTRAVENOUS | Status: DC | PRN
  Filled 2011-07-02: qty 2

## 2011-07-02 MED ORDER — MIDAZOLAM HCL 2 MG/2ML IJ SOLN
4.0000 mg | Freq: Once | INTRAMUSCULAR | Status: AC
  Administered 2011-07-02: 4 mg via INTRAVENOUS

## 2011-07-02 MED ORDER — SODIUM CHLORIDE 0.9 % IV SOLN
1000.0000 mg | INTRAVENOUS | Status: AC
  Administered 2011-07-02: 1000 mg via INTRAVENOUS
  Filled 2011-07-02: qty 20

## 2011-07-02 MED ORDER — MIDAZOLAM HCL 2 MG/2ML IJ SOLN
4.0000 mg | Freq: Once | INTRAMUSCULAR | Status: AC
  Administered 2011-07-02: 4 mg via INTRAVENOUS
  Administered 2011-07-02: 20:00:00 via INTRAVENOUS

## 2011-07-02 MED ORDER — SODIUM CHLORIDE 0.9 % IV SOLN
25.0000 ug/h | INTRAVENOUS | Status: AC
  Administered 2011-07-02: 25 ug/h via INTRAVENOUS
  Filled 2011-07-02: qty 50

## 2011-07-02 MED ORDER — ETOMIDATE 2 MG/ML IV SOLN
INTRAVENOUS | Status: AC
  Filled 2011-07-02: qty 20

## 2011-07-02 MED ORDER — ROCURONIUM BROMIDE 50 MG/5ML IV SOLN
INTRAVENOUS | Status: AC
  Filled 2011-07-02: qty 2

## 2011-07-02 MED ORDER — BIOTENE DRY MOUTH MT LIQD
15.0000 mL | Freq: Four times a day (QID) | OROMUCOSAL | Status: DC
  Administered 2011-07-03 – 2011-07-04 (×6): 15 mL via OROMUCOSAL

## 2011-07-02 MED ORDER — FENTANYL BOLUS VIA INFUSION
50.0000 ug | Freq: Four times a day (QID) | INTRAVENOUS | Status: DC | PRN
  Filled 2011-07-02: qty 100

## 2011-07-02 MED ORDER — DILTIAZEM HCL 100 MG IV SOLR
INTRAVENOUS | Status: AC
  Filled 2011-07-02: qty 100

## 2011-07-02 MED ORDER — DILTIAZEM HCL 100 MG IV SOLR
5.0000 mg/h | Freq: Once | INTRAVENOUS | Status: AC
  Administered 2011-07-02: 5 mg/h via INTRAVENOUS
  Filled 2011-07-02: qty 100

## 2011-07-02 MED ORDER — SODIUM CHLORIDE 0.9 % IV SOLN
2.0000 mg/h | INTRAVENOUS | Status: AC
  Administered 2011-07-02: 2 mg/h via INTRAVENOUS
  Filled 2011-07-02: qty 10

## 2011-07-02 MED ORDER — LORAZEPAM 2 MG/ML IJ SOLN
INTRAMUSCULAR | Status: AC
  Administered 2011-07-02: 4 mg via INTRAVENOUS
  Filled 2011-07-02: qty 2

## 2011-07-02 MED ORDER — SODIUM CHLORIDE 0.9 % IV SOLN: 250.0000 mL | INTRAVENOUS | Status: DC | PRN

## 2011-07-02 MED ORDER — PANTOPRAZOLE SODIUM 40 MG IV SOLR
40.0000 mg | Freq: Every day | INTRAVENOUS | Status: DC
  Administered 2011-07-02: 40 mg via INTRAVENOUS
  Filled 2011-07-02 (×2): qty 40

## 2011-07-02 NOTE — ED Notes (Addendum)
Pt moved to room 4. Care assumed. Dr. Adriana Simas at bedside.

## 2011-07-02 NOTE — ED Notes (Signed)
No change in pt condition. Dr. Adriana Simas remains at bedside. Awaiting anesthesia

## 2011-07-02 NOTE — ED Notes (Signed)
REPORT GIVEN TO J. MONROE RN AT 3100 FLOOR , RT NOTIFIED FOR TRANSPORT, DR. Synetta Fail ADMITTING PT. AT THIS TIME , IV SITES UNREMARKABLE , FOLEY CATHETER AND ETT TUBE INTACT.

## 2011-07-02 NOTE — H&P (Signed)
[name] is an 65 y.o. male.   Chief Complaint: Seizure, possible stroke HPI: Patient is a 65 y/o AAM with PMHx of seizures, strokes (about 6 as per wife), HTN, CAD s/p CABG, tobacco abuse dementia, who was admitted to [facility] due to altered mental status and developed a seizure while in the ER As per wife, pt went to bed last night ok and today he was not talking and very drowsy. She noticed today more L sided weakness than baseline due to previous strokes. He has not been compliant with his seizure meds (dilatin and Phenobarbital). In the ER, pt developed a seizure and was intubated for airway protection. He was also given cardizem for a fib with RVR.  ROS: no fevers, chills, night sweats, cough, sputum production, headaches, vision changes. All other systems were negative.  Past Medical History  Diagnosis Date  . Stroke   . Seizures   . Hypertension     Past Surgical History  Procedure Date  . Coronary artery bypass graft     History reviewed. No pertinent family history. Social History:  reports that he has quit smoking. His smoking use included Cigarettes. He has a 80 pack-year smoking history. He quit smokeless tobacco use about 5 months ago. He reports that he does not drink alcohol or use illicit drugs.  Allergies: No Known Allergies  Medications Prior to Admission  Medication Dose Route Frequency Provider Last Rate Last Dose  . 0.9 %  sodium chloride infusion  250 mL Intravenous PRN Orbie Hurst      . albuterol (PROVENTIL HFA;VENTOLIN HFA) 108 (90 BASE) MCG/ACT inhaler 4 puff  4 puff Inhalation Q2H PRN Orbie Hurst      . diltiazem (CARDIZEM) 100 mg in dextrose 5 % 100 mL infusion  5 mg/hr Intravenous Once Donnetta Hutching, Erik 5 mL/hr at 07/02/11 1941 5 mg/hr at 07/02/11 1941  . diltiazem (CARDIZEM) 100 MG injection           . etomidate (AMIDATE) 2 MG/ML injection           . etomidate (AMIDATE) 2 MG/ML injection           . etomidate (AMIDATE) injection 15 mg  15 mg Intravenous  Once Donnetta Hutching, Erik   15 mg at 07/02/11 1917  . fentaNYL (SUBLIMAZE) 10 mcg/mL in sodium chloride 0.9 % 250 mL infusion  25 mcg/hr Intravenous To Major Donnetta Hutching, Erik 2.5 mL/hr at 07/02/11 2023 25 mcg/hr at 07/02/11 2023  . fentaNYL (SUBLIMAZE) 10 mcg/mL in sodium chloride 0.9 % 250 mL infusion  50-400 mcg/hr Intravenous Titrated Orbie Hurst       And  . fentaNYL (SUBLIMAZE) bolus via infusion 50-100 mcg  50-100 mcg Intravenous Q6H PRN Orbie Hurst      . heparin injection 5,000 Units  5,000 Units Subcutaneous Q8H Orbie Hurst      . lidocaine (cardiac) 100 mg/77ml (XYLOCAINE) 20 MG/ML injection 2%           . lidocaine (cardiac) 100 mg/36ml (XYLOCAINE) 20 MG/ML injection 2%           . LORazepam (ATIVAN) injection 4 mg  4 mg Intravenous Once Donnetta Hutching, Erik   4 mg at 07/02/11 1901  . midazolam (VERSED) 1 mg/mL in sodium chloride 0.9 % 50 mL infusion  2-10 mg/hr Intravenous Titrated Orbie Hurst       And  . midazolam (VERSED) 1 mg/mL bolus via infusion 1-2 mg  1-2 mg Intravenous Q2H PRN Lars Mage  Ellaree Gear      . midazolam (VERSED) 1 mg/mL in sodium chloride 0.9 % 50 mL infusion  2 mg/hr Intravenous To Major Donnetta Hutching, Erik 2 mL/hr at 07/02/11 2024 2 mg/hr at 07/02/11 2024  . midazolam (VERSED) injection 4 mg  4 mg Intravenous Once Donnetta Hutching, Erik      . midazolam (VERSED) injection 4 mg  4 mg Intravenous Once Shannia Jacuinde   4 mg at 07/02/11 2037  . pantoprazole (PROTONIX) injection 40 mg  40 mg Intravenous QHS Orbie Hurst      . phenytoin (DILANTIN) 1,000 mg in sodium chloride 0.9 % 250 mL IVPB  1,000 mg Intravenous Once Orbie Hurst      . rocuronium (ZEMURON) 50 MG/5ML injection           . rocuronium (ZEMURON) 50 MG/5ML injection           . succinylcholine (ANECTINE) 20 MG/ML injection           . succinylcholine (ANECTINE) 20 MG/ML injection           . succinylcholine (ANECTINE) injection 125 mg  125 mg Intravenous Once Donnetta Hutching, Erik   125 mg at 07/02/11 1917   No current outpatient prescriptions on file as of  07/02/2011.    Results for orders placed during the hospital encounter of 07/02/11 (from the past 48 hour(s))  CBC     Status: Normal   Collection Time   07/02/11  4:29 PM      Component Value Range Comment   WBC 8.5  4.0 - 10.5 (K/uL)    RBC 4.35  4.22 - 5.81 (MIL/uL)    Hemoglobin 13.3  13.0 - 17.0 (g/dL)    HCT 16.1  09.6 - 04.5 (%)    MCV 90.1  78.0 - 100.0 (fL)    MCH 30.6  26.0 - 34.0 (pg)    MCHC 33.9  30.0 - 36.0 (g/dL)    RDW 40.9  81.1 - 91.4 (%)    Platelets 184  150 - 400 (K/uL)   DIFFERENTIAL     Status: Normal   Collection Time   07/02/11  4:29 PM      Component Value Range Comment   Neutrophils Relative 76  43 - 77 (%)    Neutro Abs 6.4  1.7 - 7.7 (K/uL)    Lymphocytes Relative 17  12 - 46 (%)    Lymphs Abs 1.5  0.7 - 4.0 (K/uL)    Monocytes Relative 6  3 - 12 (%)    Monocytes Absolute 0.5  0.1 - 1.0 (K/uL)    Eosinophils Relative 2  0 - 5 (%)    Eosinophils Absolute 0.1  0.0 - 0.7 (K/uL)    Basophils Relative 0  0 - 1 (%)    Basophils Absolute 0.0  0.0 - 0.1 (K/uL)   BASIC METABOLIC PANEL     Status: Abnormal   Collection Time   07/02/11  5:43 PM      Component Value Range Comment   Sodium 139  135 - 145 (mEq/L)    Potassium 4.0  3.5 - 5.1 (mEq/L) SLIGHT HEMOLYSIS   Chloride 104  96 - 112 (mEq/L)    CO2 25  19 - 32 (mEq/L)    Glucose, Bld 90  70 - 99 (mg/dL)    BUN 12  6 - 23 (mg/dL)    Creatinine, Ser 7.82  0.50 - 1.35 (mg/dL)    Calcium 9.3  8.4 -  10.5 (mg/dL)    GFR calc non Af Amer 77 (*) >90 (mL/min)    GFR calc Af Amer 89 (*) >90 (mL/min)   URINALYSIS, ROUTINE W REFLEX MICROSCOPIC     Status: Abnormal   Collection Time   07/02/11  8:01 PM      Component Value Range Comment   Color, Urine YELLOW  YELLOW     Appearance HAZY (*) CLEAR     Specific Gravity, Urine 1.024  1.005 - 1.030     pH 6.0  5.0 - 8.0     Glucose, UA 100 (*) NEGATIVE (mg/dL)    Hgb urine dipstick MODERATE (*) NEGATIVE     Bilirubin Urine NEGATIVE  NEGATIVE     Ketones, ur  NEGATIVE  NEGATIVE (mg/dL)    Protein, ur >161 (*) NEGATIVE (mg/dL)    Urobilinogen, UA 0.2  0.0 - 1.0 (mg/dL)    Nitrite NEGATIVE  NEGATIVE     Leukocytes, UA NEGATIVE  NEGATIVE    URINE MICROSCOPIC-ADD ON     Status: Abnormal   Collection Time   07/02/11  8:01 PM      Component Value Range Comment   Squamous Epithelial / LPF RARE  RARE     WBC, UA 0-2  <3 (WBC/hpf)    RBC / HPF 0-2  <3 (RBC/hpf)    Bacteria, UA RARE  RARE     Casts HYALINE CASTS (*) NEGATIVE     Urine-Other AMORPHOUS URATES/PHOSPHATES     PHENYTOIN LEVEL, TOTAL     Status: Abnormal   Collection Time   07/02/11  8:06 PM      Component Value Range Comment   Phenytoin Lvl 3.7 (*) 10.0 - 20.0 (ug/mL)   PHENOBARBITAL LEVEL     Status: Normal   Collection Time   07/02/11  8:06 PM      Component Value Range Comment   Phenobarbital 17.5  15.0 - 40.0 (ug/mL)   POCT I-STAT 3, BLOOD GAS (G3+)     Status: Abnormal   Collection Time   07/02/11  8:43 PM      Component Value Range Comment   pH, Arterial 7.354  7.350 - 7.450     pCO2 arterial 42.3  35.0 - 45.0 (mmHg)    pO2, Arterial 264.0 (*) 80.0 - 100.0 (mmHg)    Bicarbonate 23.6  20.0 - 24.0 (mEq/L)    TCO2 25  0 - 100 (mmol/L)    O2 Saturation 100.0      Acid-base deficit 2.0  0.0 - 2.0 (mmol/L)    Collection site RADIAL, ALLEN'S TEST ACCEPTABLE      Drawn by RT      Sample type ARTERIAL      Ct Head Wo Contrast  07/02/2011  *RADIOLOGY REPORT*  Clinical Data: Left-sided weakness, aphasia  CT HEAD WITHOUT CONTRAST  Technique:  Contiguous axial images were obtained from the base of the skull through the vertex without contrast.  Comparison: MRI brain dated 07/22/2010  Findings: Hypodensity in the right parietal region (series 2/image 22), possibly reflecting an age indeterminate infarct, new from prior MRI.  No evidence of parenchymal hemorrhage or extra-axial fluid collection.  No mass lesion, mass effect, or midline shift.  Extensive subcortical white matter and  periventricular small vessel ischemic changes.  Intracranial atherosclerosis.  Global cortical atrophy with secondary ventriculomegaly.  The visualized paranasal sinuses are essentially clear. The mastoid air cells are unopacified.  No evidence of calvarial fracture.  IMPRESSION: Hypodensity in the right  parietal region, possibly reflecting an age indeterminate infarct.  Extensive small vessel ischemic changes with atrophy and intracranial atherosclerosis.  Original Report Authenticated By: Charline Bills, M.D.   Dg Chest Port 1 View  07/02/2011  *RADIOLOGY REPORT*  Clinical Data: Post intubation  PORTABLE CHEST - 1 VIEW  Comparison: 08/19/2005  Findings: Endotracheal tube terminates 5 cm above the carina.  The lungs are essentially clear. No pleural effusion or pneumothorax.  Mild cardiomegaly. Postsurgical changes related to prior CABG.  IMPRESSION: Endotracheal tube terminates 5 cm above the carina.  Original Report Authenticated By: Charline Bills, M.D.    Review of Systems  Gastrointestinal: Positive for abdominal pain.    Blood pressure 125/89, pulse 113, temperature 97.3 F (36.3 C), temperature source Oral, resp. rate 18, SpO2 100.00%. Physical Exam   Assessment/Plan Patient is a 65 y/o AAM with PMHx of seizures, strokes (about 6 as per wife), HTN, CAD s/p CABG, tobacco abuse dementia, who was admitted to [facility] due to altered mental status and developed a seizure while in the ER and was intubated for airway protection  1. Seizure: Likely due to medication noncompliance. Infection is still on the differential, but pt does not have elevated WBC, UA is unremarkable, CXR with no infiltrates, no sick contact of clinical signs of infection or fevers. Metabolic is also on the differential but BMP is WNL. - Dilantin level 3.7 - CT head showed atherosclerotic changes and possible new stroke, but no hemorrhagic stroke. - Will cont sedation with Versed and fentanyl - Will load him with Dilantin  1000 mg IV now - Neurology on consult. - Will get echo  2. Acute resp failure - Intubated for airway protection due to seizure - On PRVC 15/500/5/50% - CXR with no infiltrates - Cont vent support for now - Sedation with versed and fentanyl - Albuterol PRN  3. A fib with RVR - HR 120's, started on Cardizem - Will get echo - No anticoag for now due to possible stroke. Will d/w neurology - Will get cardiac enzymes  4. CAD s/p CABG - On plavix at home - Possible issues with medication noncompliance - Will get cardiac enzymes - Echo  5. HTN - BP stable now.  - Will hold BP med due to possible new stroke.  6. DVT/GI prophylaxis - Heparin SQ - Protonix IV  7. FEN - NS KVO - NPO for now - Will replace electrolytes as needed.  Layton Tappan 07/02/2011, 9:31 PM

## 2011-07-02 NOTE — ED Notes (Signed)
PERSONAL BELONGINGS GIVEN TO FAMILY BEFORE TRANSPORT.

## 2011-07-02 NOTE — ED Notes (Signed)
Pt placed on 100% non rebreather per Dr. Adriana Simas order. Awaiting RT

## 2011-07-02 NOTE — ED Notes (Signed)
Meds admin per Dr. Adriana Simas order. Preparing to intubate

## 2011-07-02 NOTE — ED Provider Notes (Addendum)
History     CSN: [id number] Arrival date & time: 07/02/2011  2:32 PM   First MD Initiated Contact with Patient 07/02/11 1508      Chief Complaint  Patient presents with  . Weakness  . Altered Mental Status    (Consider location/radiation/quality/duration/timing/severity/associated sxs/prior treatment) HPI... level V caveat for urgent need for intervention and altered mental status.  Per his wife patient went to bed approximately 8:00 last night. He awoke at noon it was very confused and unable to walk. It took her an hour and a half dressing. He has not been speaking.  She states he is weak on the left side. He has a history of stroke times several episodes, first one in 1992. He is not following commands. He normally is ambulatory and alert.  Past Medical History  Diagnosis Date  . Stroke   . Seizures   . Hypertension     Past Surgical History  Procedure Date  . Coronary artery bypass graft     History reviewed. No pertinent family history.  History  Substance Use Topics  . Smoking status: Former Games developer  . Smokeless tobacco: Not on file  . Alcohol Use: No      Review of Systems  Unable to perform ROS: Mental status change    Allergies  Review of patient's allergies indicates no known allergies.  Home Medications   Current Outpatient Rx  Name Route Sig Dispense Refill  . DONEPEZIL HCL 10 MG PO TABS Oral Take 10 mg by mouth at bedtime as needed.      Marland Kitchen MEMANTINE HCL 10 MG PO TABS Oral Take 10 mg by mouth 2 (two) times daily.      Marland Kitchen PHENOBARBITAL 32.4 MG PO TABS Oral Take 32.4 mg by mouth 2 (two) times daily.      Marland Kitchen PHENYTOIN SODIUM EXTENDED 100 MG PO CAPS Oral Take by mouth 3 (three) times daily.        BP 109/79  Pulse 68  Temp(Src) 97.3 F (36.3 C) (Oral)  Resp 18  SpO2 99%  Physical Exam  Nursing note and vitals reviewed. Constitutional: He appears well-developed and well-nourished.  HENT:  Head: Normocephalic and atraumatic.  Eyes: Conjunctivae  and EOM are normal. Pupils are equal, round, and reactive to light.  Neck: Normal range of motion. Neck supple.  Cardiovascular: Normal rate and regular rhythm.   Pulmonary/Chest: Effort normal and breath sounds normal.  Abdominal: Soft. Bowel sounds are normal.  Musculoskeletal: Normal range of motion.  Neurological:       Patient does not respond to direct questions. He does not move extremities to command. Difficult to do full neurological assessment.  Skin: Skin is warm and dry.  Psychiatric:       Unable    ED Course  Procedures (including critical care time)  Date: 07/02/2011  Rate: 81  Rhythm: normal sinus rhythm  QRS Axis: normal  Intervals: normal  ST/T Wave abnormalities: normal  Conduction Disutrbances:right bundle branch block  Narrative Interpretation:   Old EKG Reviewed: changes noted PAC  Labs Reviewed  CBC  DIFFERENTIAL  BASIC METABOLIC PANEL  URINALYSIS, ROUTINE W REFLEX MICROSCOPIC   No results found.   No diagnosis found.  CRITICAL CARE Performed by: Donnetta Hutching   Total critical care time: 60  Critical care time was exclusive of separately billable procedures and treating other patients.  Critical care was necessary to treat or prevent imminent or life-threatening deterioration.  Critical care was time spent personally by me  on the following activities: development of treatment plan with patient and/or surrogate as well as nursing, discussions with consultants, evaluation of patient's response to treatment, examination of patient, obtaining history from patient or surrogate, ordering and performing treatments and interventions, ordering and review of laboratory studies, ordering and review of radiographic studies, pulse oximetry and re-evaluation of patient's condition.  MDM  I suspect patient has had a stroke. Will obtain CT of head labs and EKG. Will admit  Called to room at approximately 1900. Patient was having active tonic-clonic seizure.  Intravenous Ativan 4 mg IV given. Airway secured. Patient moved to room 4.  Tongue and oral pharyngeal airway noted to be edematous from seizure and biting tongue.  Patient attempted to be intubated the RSI technique with etomidate and succinylcholine.  Intubation failed. Anesthesia was consulted. Successful intubation by anesthesia. IV Cardizem started for atrial fibrillation. Discussed with family. Also discussed with critical care medicine who will admit patient. Patient remained stable during his ED course with good oxygenation at all time      Donnetta Hutching, MD 07/02/11 1651  Donnetta Hutching, MD 07/02/11 2146

## 2011-07-02 NOTE — ED Notes (Signed)
DR, ROJAS CCM SPEAKING WITH FAMILY AT THIS TIME , PT. SEDATED WITH ETT INTACT , IV SITES/FOLEY CATHETER INTACT , CARDIZEM DRIP INFUSING AT 5 MG/HR,  FENTANYL DRIP INFUSING AT 25 MCG/HR AND VERSED DRIP AT 5 MG/HR.

## 2011-07-02 NOTE — ED Notes (Signed)
44fr. Foley inserted via sterile technique per Reita Cliche, RN. Tolerated well. Dark yellow urine returned.

## 2011-07-02 NOTE — Consult Note (Signed)
Neurology Consult Note  Referring Physician: Orbie Moreno  Chief Complaint: Altered Mental Status  HPI: [name] is a 65 y.o.  male with a history of prior CABG, HTN, multiple strokes and seizure disorder who presents with confusion.  The patient was at home this morning and fell when getting out of bed.  This was not particularly unusual for him as falls are fairly common place.  His wife got him set up in the bed and then he was going to get dressed, which is something he can normally do.  She came back later to find him still in the bedroom holding his pants in his hands and sitting still without trying to put them on.  He was not responsive to her.  She led him to the kitchen and he did eat some food slowly but was not as responsive as they would like so they brought him to the ER.  While here in the ER he had a generalized seizure of which the family and current nurse did not know how long it lasted.  Because of the seizure he was placed on a midazolam drip and intubated.  With regards to his seizures, he will have a few seizures a year but had not had a GTC in 4 years.  He is rarely compliant with his medications.    Current Outpatient Rx  Name Route Sig Dispense Refill  . DONEPEZIL HCL 10 MG PO TABS Oral Take 10 mg by mouth at bedtime as needed.      Marland Kitchen MEMANTINE HCL 10 MG PO TABS Oral Take 10 mg by mouth 2 (two) times daily.      Marland Kitchen PHENOBARBITAL 32.4 MG PO TABS Oral Take 32.4 mg by mouth 2 (two) times daily.      Marland Kitchen PHENYTOIN SODIUM EXTENDED 100 MG PO CAPS Oral Take by mouth 3 (three) times daily.         Past Medical History  Diagnosis Date  . Hypertension   . Stroke     Multiple  . Seizures     partial and generalized, poor compliance     Past Surgical History  Procedure Date  . Coronary artery bypass graft     No Known Allergies  History reviewed. No pertinent family history.  History  Substance Use Topics  . Smoking status: Former Smoker -- 2.0 packs/day for  40 years    Types: Cigarettes  . Smokeless tobacco: Former Neurosurgeon    Quit date: 01/03/2011  . Alcohol Use: No      Review of Systems A complete review of systems was performed and was negative.  Physical Exam: BP 117/83  Pulse 103  Temp(Src) 97.3 F (36.3 C) (Oral)  Resp 16  SpO2 100%  GENERAL:     intubated, sedated, but will open eyes to voice  CARDIOVASCULAR:   - Irregular rate and rhythm, no thrills or palpable murmurs, S1, S2, no murmur, no rubs or gallops.   MENTAL STATUS EXAM:    - Orientation: Intubated, cannot speak - Memory: Intubated, cannot speak - Attention, concentration: Sedated, but did follow some simple commands (wiggle toes, squeeze hand)  - Language: Intubated, cannot speak  - Fund of knowledge: Intubated, cannot speak   CRANIAL NERVES:    - CN 2 (Optic): Blink to threat bilaterally, could not view fundi.  - CN 3,4,6 (EOM): Pupils equal and reactive to light and near.  Dolls eyes intact. - CN 5 (Trigeminal): Facial sensation is normal, no weakness of  masticatory muscles.  - CN 7 (Facial): No facial weakness or asymmetry.  - CN 8 (Auditory): Auditory acuity grossly normal.  - CN 9,10 (Glossophar): could not view due to endotracheal tube.  - CN 11 (spinal access): turned head in both directions -CN 12 (Hypoglossal):could not view due to endotracheal tube.   MOTOR: Would not follow commands for formal testing.  Moved all extremities with R side more than L.  Strength at least 4/5 on the right side, and 3/5 on the left side.     Muscle Tone: Tone and muscle bulk are normal in the upper and lower extremities.   REFLEXES:   - Biceps:                 (R): 2+  (L):2+  - Brachioradialis:    (R): 2+  (L): 2+  - Patellar:                (R): 1+   (L):1+  - Achilles:                (R): 0   (L):0  - Babinski:   (R): absent  (L): present  COORDINATION:  Could not perform due to mental status.   SENSATION:  Withdrew to pain in all extremities.   GAIT: Could  not perform due to intubation/sedation  Diagnostic Studies: CT Head- diffuse white matter hypodensity with ex vacuo dilatation of ventricles; possibly new area of hypodensity in the right superior posterior parietal region.  CBC, BMP reviewed. Dilantin level 3.7 Phenobarb Level 19.3  Impression: 65 y/o male presenting with acute encephalopathy and seizure.  History is concerning for prolonged partial seizure while at home and resulting generalized seizure here in the ER. Typical etiologies for breakthrough seizures include medication noncompliance, infections, metabolic abnormalities, or new cerebral insults such as strokes.  The most likely cause of his seizure today was due to medication noncompliance given his dilantin level was only 3.7.  There is an area on his CT scan which could be a new stroke which may have also incited the event and this requires evaluation.  Infections are unlikely given his normal white count, normal temperature, non-infectious UA, and normal chest X-ray.  Recommendations: 1.  Load with Dilantin or Fosphenytoin 1000 mg IV and check a 2 hour post load level.  Goal total dilantin level is 15-20.  He will need an albumin checked for correction of his dilantin level. 2.  Continue Dilantin maintenance dose of 300 mg daily and Phenobarbital 64.8mg  daily. 3.  Consider MRI of the brain with and without contrast to look for new stroke or intracranial lesions as potential etiology of this new seizure.  It has been a pleasure to participate in the care of this patient.  Best Regards, Lajuana Carry MD

## 2011-07-02 NOTE — ED Notes (Signed)
No change in pt condition. Resp per ambu bag. O2 sat at 100%. Anesthesia at bedside.

## 2011-07-02 NOTE — ED Notes (Signed)
Second attempt intubaton unsuccessful. Anesthesia paged per Dr. Adriana Simas order. RT at bedside to ambu pt.

## 2011-07-02 NOTE — ED Notes (Signed)
Pt wife states last seen normal last night before bed time at 8pm.  Upon pt getting up today around 1200 pt was unable to walk and incoherent which is not normal.  Pt has a history of multiple strokes per wife report.

## 2011-07-02 NOTE — ED Notes (Signed)
DR. Georgiana Shore (NEUROLOGIST) AT BEDSIDE EVALUATING PT/ FAMILY AT BEDSIDE SPEAKING WITH NEUROLOGIST.

## 2011-07-02 NOTE — ED Notes (Signed)
Dr. Shirlyn Goltz intubated patient. Size 8. 22 at teeth. Placement confirmed per auscultation and positive co2 change.

## 2011-07-02 NOTE — ED Notes (Signed)
Neurology (Dr. Georgiana Shore) was paged to Dr. Synetta Fail (PCCM).

## 2011-07-02 NOTE — ED Notes (Signed)
DR. Synetta Fail ORDERED BOLUS VERSED 5 MG AND INCREASED FENTANYL DRIP TO 50 MCG/HR.

## 2011-07-02 NOTE — ED Notes (Signed)
First attempt intuabtion unsuccessful. RT at bedside to ambu. o2 sat 100%

## 2011-07-02 NOTE — ED Notes (Signed)
PT. RECEIVED VERSED 4 MG IV FOR SEDATION PER DR. COOK . DID NOT RECEIVE 2 MG VERSED AS PFREVIOUSLY CHARTED.

## 2011-07-02 NOTE — ED Notes (Signed)
Patient last seen normal 11-15 at [facility] upon going to bed.  Patient woke up at 1200PM today and is confused, weak, not following commands. (per wife) Patient seems confused.  Patient is normally alert and orientedx4 per wife.

## 2011-07-02 NOTE — Consults (Addendum)
Called to intubate pt for airway protection . Pt identified and chart reviewed. Pre O2 by mask and airway evaluated. Amidate 12mg  and Succinylcholine 100mg  injected. DL performed by Ernie Avena CRNA using a Mac 4 blade. 8.0 ETT placed. Breath sounds heard bilaterally. Positive ETCO2. ETT taped by RT. CXR pending. Time of procedure 1940-1950.

## 2011-07-02 NOTE — ED Notes (Signed)
Patient transported to CT 

## 2011-07-02 NOTE — ED Notes (Signed)
Anestheais at bedside.

## 2011-07-02 NOTE — ED Notes (Signed)
Wife at bedside, states she spoke to pt and then he began having a seizure.   Wife states seizure began at 1855 approx.  Pt is currently clinched biting at [place]

## 2011-07-02 NOTE — ED Notes (Signed)
Report given to Bobby RN

## 2011-07-02 NOTE — ED Notes (Signed)
RT continues to ambu pt. Dr. Adriana Simas at bedside. O2 sat 100%. Awaiting anesthesia.

## 2011-07-03 ENCOUNTER — Inpatient Hospital Stay (HOSPITAL_COMMUNITY): Payer: Medicare Other

## 2011-07-03 ENCOUNTER — Encounter (HOSPITAL_COMMUNITY): Payer: Self-pay | Admitting: *Deleted

## 2011-07-03 DIAGNOSIS — J9600 Acute respiratory failure, unspecified whether with hypoxia or hypercapnia: Secondary | ICD-10-CM

## 2011-07-03 DIAGNOSIS — F0390 Unspecified dementia without behavioral disturbance: Secondary | ICD-10-CM | POA: Diagnosis present

## 2011-07-03 DIAGNOSIS — G40309 Generalized idiopathic epilepsy and epileptic syndromes, not intractable, without status epilepticus: Secondary | ICD-10-CM

## 2011-07-03 DIAGNOSIS — R0902 Hypoxemia: Secondary | ICD-10-CM

## 2011-07-03 LAB — PHENYTOIN LEVEL, TOTAL: Phenytoin Lvl: 23 ug/mL — ABNORMAL HIGH (ref 10.0–20.0)

## 2011-07-03 LAB — CBC
Hemoglobin: 13.3 g/dL (ref 13.0–17.0)
MCHC: 33.4 g/dL (ref 30.0–36.0)
Platelets: 184 10*3/uL (ref 150–400)
RBC: 4.39 MIL/uL (ref 4.22–5.81)

## 2011-07-03 LAB — BLOOD GAS, ARTERIAL
Acid-Base Excess: 0.2 mmol/L (ref 0.0–2.0)
Bicarbonate: 24.1 mEq/L — ABNORMAL HIGH (ref 20.0–24.0)
FIO2: 0.4 %
O2 Saturation: 98.1 %
PEEP: 5 cmH2O
Patient temperature: 98.6

## 2011-07-03 LAB — MRSA PCR SCREENING: MRSA by PCR: NEGATIVE

## 2011-07-03 LAB — BASIC METABOLIC PANEL
CO2: 25 mEq/L (ref 19–32)
GFR calc non Af Amer: >90 mL/min (ref >90)
Glucose, Bld: 123 mg/dL — ABNORMAL HIGH (ref 70–99)
Potassium: 3.6 mEq/L (ref 3.5–5.1)
Sodium: 140 mEq/L (ref 135–145)

## 2011-07-03 LAB — PHOSPHORUS: Phosphorus: 2.8 mg/dL (ref 2.3–4.6)

## 2011-07-03 MED ORDER — DILTIAZEM 12 MG/ML ORAL SUSPENSION: 30.0000 mg | Freq: Four times a day (QID) | ORAL | Status: DC

## 2011-07-03 MED ORDER — FENTANYL CITRATE 0.05 MG/ML IJ SOLN: 50.0000 ug | INTRAMUSCULAR | Status: DC | PRN

## 2011-07-03 MED ORDER — POTASSIUM PHOSPHATE DIBASIC 3 MMOLE/ML IV SOLN
30.0000 mmol | Freq: Once | INTRAVENOUS | Status: AC
  Administered 2011-07-03: 30 mmol via INTRAVENOUS
  Filled 2011-07-03: qty 10

## 2011-07-03 MED ORDER — DILTIAZEM HCL 30 MG PO TABS
30.0000 mg | ORAL_TABLET | Freq: Four times a day (QID) | ORAL | Status: DC
  Administered 2011-07-03 – 2011-07-07 (×15): 30 mg via ORAL
  Filled 2011-07-03 (×19): qty 1

## 2011-07-03 MED ORDER — PHENOBARBITAL 32.4 MG PO TABS
32.5000 mg | ORAL_TABLET | Freq: Two times a day (BID) | ORAL | Status: DC
  Administered 2011-07-03 (×2): 32.5 mg via ORAL
  Administered 2011-07-04: 32.4 mg via ORAL
  Administered 2011-07-04: 32.5 mg via ORAL
  Filled 2011-07-03 (×5): qty 1

## 2011-07-03 MED ORDER — PANTOPRAZOLE SODIUM 40 MG PO TBEC
40.0000 mg | DELAYED_RELEASE_TABLET | Freq: Every day | ORAL | Status: DC
  Administered 2011-07-03 – 2011-07-07 (×5): 40 mg via ORAL
  Filled 2011-07-03 (×5): qty 1

## 2011-07-03 MED ORDER — MAGNESIUM SULFATE 40 MG/ML IJ SOLN
2.0000 g | Freq: Once | INTRAMUSCULAR | Status: AC
  Administered 2011-07-03: 2 g via INTRAVENOUS
  Filled 2011-07-03: qty 50

## 2011-07-03 MED ORDER — PHENYTOIN SODIUM EXTENDED 100 MG PO CAPS: 300.0000 mg | ORAL_CAPSULE | Freq: Three times a day (TID) | ORAL | Status: DC

## 2011-07-03 MED ORDER — JEVITY 1.2 CAL PO LIQD: 1000.0000 mL | ORAL | Status: DC

## 2011-07-03 MED ORDER — PHENYTOIN SODIUM EXTENDED 100 MG PO CAPS
100.0000 mg | ORAL_CAPSULE | Freq: Three times a day (TID) | ORAL | Status: DC
  Administered 2011-07-04 – 2011-07-05 (×3): 100 mg via ORAL
  Filled 2011-07-03 (×6): qty 1

## 2011-07-03 MED ORDER — PHENOBARBITAL 30 MG PO TABS: 30.0000 mg | ORAL_TABLET | Freq: Two times a day (BID) | ORAL | Status: DC

## 2011-07-03 MED ORDER — MIDAZOLAM HCL 2 MG/2ML IJ SOLN: 2.0000 mg | INTRAMUSCULAR | Status: DC | PRN

## 2011-07-03 MED ORDER — MAGNESIUM SULFATE BOLUS VIA INFUSION
2.0000 g | Freq: Once | INTRAVENOUS | Status: DC
  Filled 2011-07-03: qty 500

## 2011-07-03 MED ORDER — PRO-STAT SUGAR FREE PO LIQD
30.0000 mL | Freq: Two times a day (BID) | ORAL | Status: DC
  Filled 2011-07-03 (×2): qty 30

## 2011-07-03 NOTE — Progress Notes (Signed)
INITIAL ADULT NUTRITION ASSESSMENT Date: 07/03/2011   Time: 10:12 AM  Reason for Assessment: MD Consult for TF  ASSESSMENT: Male 65 y.o.  Dx: Acute respiratory failure  Hx:  Past Medical History  Diagnosis Date  . Hypertension   . Stroke     Multiple  . Seizures     partial and generalized, poor compliance   Related Meds:     . antiseptic oral rinse  15 mL Mouth Rinse QID  . chlorhexidine  15 mL Mouth Rinse BID  . diltiazem (CARDIZEM) infusion  5 mg/hr Intravenous Once  . diltiazem      . etomidate      . etomidate  15 mg Intravenous Once  . fentaNYL infusion INTRAVENOUS  25 mcg/hr Intravenous To Major  . heparin  5,000 Units Subcutaneous Q8H  . lidocaine (cardiac) 100 mg/52ml      . LORazepam  4 mg Intravenous Once  . magnesium  2 g Intravenous Once  . midazolam (VERSED) infusion  2 mg/hr Intravenous To Major  . midazolam  4 mg Intravenous Once  . midazolam  4 mg Intravenous Once  . pantoprazole (PROTONIX) IV  40 mg Intravenous QHS  . phenytoin (DILANTIN) IV  1,000 mg Intravenous To Major  . potassium phosphate IVPB (mmol)  30 mmol Intravenous Once  . rocuronium      . succinylcholine      . succinylcholine  125 mg Intravenous Once  . DISCONTD: diltiazem  30 mg Per Tube Q6H    Ht: 5\' 11"  (180.3 cm)  Wt: 183 lb 10.3 oz (83.3 kg)  Ideal Wt: 78.2 % Ideal Wt: 107%  Usual Wt: unknown   Body mass index is 25.61 kg/(m^2).  Food/Nutrition Related Hx: unknown  BMET    Component Value Date/Time   NA 140 07/03/2011 0510   K 3.6 07/03/2011 0510   CL 104 07/03/2011 0510   CO2 25 07/03/2011 0510   GLUCOSE 123* 07/03/2011 0510   BUN 11 07/03/2011 0510   CREATININE 0.85 07/03/2011 0510   CALCIUM 9.3 07/03/2011 0510   GFRNONAA >90 07/03/2011 0510   GFRAA >90 07/03/2011 0510      I/O last 3 completed shifts: In: 398 [I.V.:128; IV Piggyback:270] Out: 400 [Urine:400] Total I/O In: 60.4 [I.V.:60.4] Out: 35 [Urine:35]  Diet Order:  NPO   IVF: NS at 20  ml/h  Estimated Nutritional Needs:   Kcal: 1800 Protein: 100-117 grams Fluid: 2-2.5 liters  NUTRITION DIAGNOSIS: -Inadequate oral intake (NI-2.1).  Status: Ongoing  RELATED TO: inability to eat  AS EVIDENCE BY: NPO status  MONITORING/EVALUATION(Goals): Tube feeding to meet 90-100% of nutrition needs.  Monitor TF tolerance, labs, weights  EDUCATION NEEDS: -No education needs identified at this time  INTERVENTION: Initiate TF via enteral feeding tube (OG or NG) with Jevity 1.2 at 25 ml/h, increase by 10 ml every 4 hours to goal of 55 ml/h with Prostat 30 ml BID to provide 1728 kcals, 103 grams protein, 1069 ml free water daily.  Dietitian [phone]  DOCUMENTATION CODES Per approved criteria  -Not Applicable    [name] 07/03/2011, 10:12 AM

## 2011-07-03 NOTE — Progress Notes (Signed)
HPI:  Patient is a 65 y/o AAM with PMHx of seizures, strokes (about 6 as per wife), HTN, CAD s/p CABG, tobacco abuse dementia, who was admitted to [facility] due to altered mental status and developed a seizure while in the ER  As per wife, pt went to bed last night ok and today he was not talking and very drowsy. She noticed today more L sided weakness than baseline due to previous strokes. He has not been compliant with his seizure meds (dilatin and Phenobarbital). In the ER, pt developed a seizure and was intubated for airway protection. He was also given cardizem for a fib with RVR.  Antibiotics:   None  Cultures/Sepsis Markers:   None  Access/Protocols:  ET Tube 11/16>>>  Best Practice: DVT: SCD's and SQ hep. GI: Protonix  Subjective: No events overnight.  Physical Exam: Filed Vitals:   07/03/11 0700  BP: 107/75  Pulse: 92  Temp: 99 F (37.2 C)  Resp: 19    Intake/Output Summary (Last 24 hours) at 07/03/11 0810 Last data filed at 07/03/11 0700  Gross per 24 hour  Intake    100 ml  Output    400 ml  Net   -300 ml   Vent Mode:  [-] PRVC FiO2 (%):  [40 %-100 %] 40 % Set Rate:  [12 bmp-15 bmp] 12 bmp Vt Set:  [500 mL] 500 mL PEEP:  [5 cmH20] 5 cmH20 Plateau Pressure:  [16 cmH20] 16 cmH20  Neuro: Sedated but arousable. Cardiac: RRR, Nl S1/S2, -M/R/G. Pulmonary: CTA bilaterally. GI: Soft, NT, ND and +BS. Extremities: -edema and -tenderness.  Labs: CBC    Component Value Date/Time   WBC 11.1* 07/03/2011 0510   RBC 4.39 07/03/2011 0510   HGB 13.3 07/03/2011 0510   HCT 39.8 07/03/2011 0510   PLT 184 07/03/2011 0510   MCV 90.7 07/03/2011 0510   MCH 30.3 07/03/2011 0510   MCHC 33.4 07/03/2011 0510   RDW 14.1 07/03/2011 0510   LYMPHSABS 1.5 07/02/2011 1629   MONOABS 0.5 07/02/2011 1629   EOSABS 0.1 07/02/2011 1629   BASOSABS 0.0 07/02/2011 1629    BMET    Component Value Date/Time   NA 140 07/03/2011 0510   K 3.6 07/03/2011 0510   CL 104 07/03/2011 0510   CO2 25 07/03/2011 0510   GLUCOSE 123* 07/03/2011 0510   BUN 11 07/03/2011 0510   CREATININE 0.85 07/03/2011 0510   CALCIUM 9.3 07/03/2011 0510   GFRNONAA >90 07/03/2011 0510   GFRAA >90 07/03/2011 0510    Lab 07/03/11 0510  MG 1.9   Lab Results  Component Value Date   CALCIUM 9.3 07/03/2011   PHOS 2.8 07/03/2011    Chest Xray:   Assessment & Plan: Patient Active Hospital Problem List: Acute respiratory failure (07/02/11)   Assessment: due to post ictal state, minimal vent/oxygen needs.   Plan:  Decrease RR to 12.  PS trials today.  Hold weaning until more awake.  Titrate O2 down.  SBT once more awake, today or tomorrow.  Tachycardia (07/03/2011)   Assessment: Usually when agitated, resolved with sedation.  Hx of HTN and ? Of beta blockers, wife unaware of medications.   Plan: If becomes more tachy will consider low dose beta blockers to control HR.  Dementia (07/03/2011)   Assessment: Vascular in nature and unable to effectively manage ADL, on aricept and namenda.   Plan: Will hold dementia meds until MRI is complete.  Seizure (07/02/2011)   Assessment: After a total of  6 CVA's and now not compliant with meds.  Usually on dilantin and phenobarb.   Plan:  Restart antiepileptic meds.  MRI per neuro.  Appreciate neuro input.  Stroke (07/02/2011)   Assessment: Residual left sided weakness and demential.   Plan:  Per neuro.  MRI orders will be deferred to neuro if needed.  FEN: Start TF as I am not sure if patient will extubate today.  Wife updated bedside.  The patient is critically ill with multiple organ systems failure and requires high complexity decision making for assessment and support, frequent evaluation and titration of therapies, application of advanced monitoring technologies and extensive interpretation of multiple databases. Critical Care Time devoted to patient care services described in this note is 35 minutes.  Koren Bound, MD

## 2011-07-03 NOTE — Plan of Care (Signed)
Problem: Phase II Progression Outcomes Goal: Time pt extubated/weaned off vent Outcome: Completed/Met Date Met:  07/03/11 ~0950

## 2011-07-03 NOTE — Progress Notes (Signed)
Subjective: Patient without further seizures since admission.  Has been extubated this morning.  Asking for a piece of chocolate.  Admits to being noncompliant with medications.  Dilantin level >20 after load.  Objective: Vital signs in last 24 hours: Temp:  [97.3 F (36.3 C)-99 F (37.2 C)] 99 F (37.2 C) (11/17 0900) Pulse Rate:  [67-136] 90  (11/17 0900) Resp:  [14-24] 24  (11/17 0900) BP: (95-173)/(71-152) 111/73 mmHg (11/17 0900) SpO2:  [96 %-100 %] 99 % (11/17 0900) FiO2 (%):  [40 %-100 %] 40 % (11/17 0900) Weight:  [83.3 kg (183 lb 10.3 oz)] 183 lb 10.3 oz (83.3 kg) (11/17 0000)  Intake/Output from previous day: 11/16 0701 - 11/17 0700 In: 398 [I.V.:128; IV Piggyback:270] Out: 400 [Urine:400] Intake/Output this shift: Total I/O In: 60.4 [I.V.:60.4] Out: 35 [Urine:35] Nutritional status:    Neurologic Exam: Mental Status: Alert. Some mild confusion but can follow simple commands and needs more  Reinforcement for 3-step commands. Speech fluent without evidence of aphasia.  Cranial Nerves: II-Discs flat bilaterally. Visual fields grossly intact. III/IV/VI-Extraocular movements intact.  Pupils reactive bilaterally. V/VII-Decrease in right NLF VIII-grossly intact IX/X-normal gag XI-bilateral shoulder shrug XII-midline tongue extension  Motor: Lifts all extremities strongly against gravity although  There is some delay. Sensory: Pinprick and light touch intact throughout, bilaterally Deep Tendon Reflexes: 2+ and symmetric throughout in the upper extremities, 1+ at the knees and absent at the ankles. Plantars: Upgoing on the left and mute on the right Cerebellar: Not tested  Lab Results:  Basename 07/03/11 0510 07/02/11 2113 07/02/11 1743  WBC 11.1* 15.8* --  HGB 13.3 13.3 --  HCT 39.8 39.1 --  PLT 184 181 --  NA 140 -- 139  K 3.6 -- 4.0  CL 104 -- 104  CO2 25 -- 25  GLUCOSE 123* -- 90  BUN 11 -- 12  CREATININE 0.85 0.87 --  CALCIUM 9.3 -- 9.3  LABA1C -- --  --    Studies/Results: Ct Head Wo Contrast  07/02/2011  *RADIOLOGY REPORT*  Clinical Data: Left-sided weakness, aphasia  CT HEAD WITHOUT CONTRAST  Technique:  Contiguous axial images were obtained from the base of the skull through the vertex without contrast.  Comparison: MRI brain dated 07/22/2010  Findings: Hypodensity in the right parietal region (series 2/image 22), possibly reflecting an age indeterminate infarct, new from prior MRI.  No evidence of parenchymal hemorrhage or extra-axial fluid collection.  No mass lesion, mass effect, or midline shift.  Extensive subcortical white matter and periventricular small vessel ischemic changes.  Intracranial atherosclerosis.  Global cortical atrophy with secondary ventriculomegaly.  The visualized paranasal sinuses are essentially clear. The mastoid air cells are unopacified.  No evidence of calvarial fracture.  IMPRESSION: Hypodensity in the right parietal region, possibly reflecting an age indeterminate infarct.  Extensive small vessel ischemic changes with atrophy and intracranial atherosclerosis.  Original Report Authenticated By: Charline Bills, M.D.   Dg Chest Port 1 View  07/02/2011  *RADIOLOGY REPORT*  Clinical Data: Post intubation  PORTABLE CHEST - 1 VIEW  Comparison: 08/19/2005  Findings: Endotracheal tube terminates 5 cm above the carina.  The lungs are essentially clear. No pleural effusion or pneumothorax.  Mild cardiomegaly. Postsurgical changes related to prior CABG.  IMPRESSION: Endotracheal tube terminates 5 cm above the carina.  Original Report Authenticated By: Charline Bills, M.D.    Medications:  Scheduled:   . antiseptic oral rinse  15 mL Mouth Rinse QID  . chlorhexidine  15 mL Mouth Rinse BID  .  diltiazem (CARDIZEM) infusion  5 mg/hr Intravenous Once  . diltiazem      . etomidate      . etomidate  15 mg Intravenous Once  . feeding supplement  30 mL Per Tube BID  . fentaNYL infusion INTRAVENOUS  25 mcg/hr Intravenous  To Major  . heparin  5,000 Units Subcutaneous Q8H  . lidocaine (cardiac) 100 mg/10ml      . LORazepam  4 mg Intravenous Once  . magnesium  2 g Intravenous Once  . midazolam (VERSED) infusion  2 mg/hr Intravenous To Major  . midazolam  4 mg Intravenous Once  . midazolam  4 mg Intravenous Once  . pantoprazole (PROTONIX) IV  40 mg Intravenous QHS  . PHENObarbital  30 mg Oral BID  . phenytoin (DILANTIN) IV  1,000 mg Intravenous To Major  . phenytoin  300 mg Oral TID  . potassium phosphate IVPB (mmol)  30 mmol Intravenous Once  . rocuronium      . succinylcholine      . succinylcholine  125 mg Intravenous Once  . DISCONTD: diltiazem  30 mg Per Tube Q6H    Assessment/Plan:  Patient Active Hospital Problem List: Seizure (07/02/2011)   Assessment: Patient without further seizures since admission.  Post load level supratherapeutic   Plan: 1. Recheck level today and in AM             2. Restart Phenobarbital at home dose of 30mg  bid today             3. Restart Dilantin po tomorrow Stroke (07/02/2011)   Assessment: History of old stroke, ? of new stroke   Plan: 1. MRI of the brain without contrast. Would not initiate a new stroke work up unless an acute stroke is noted on imaging.            2. PT to evaluate patient for ambulation   LOS: 1 day   Thana Farr, MD [facility] [phone] 07/03/2011  10:34 AM

## 2011-07-03 NOTE — Plan of Care (Signed)
Problem: Phase II Progression Outcomes Goal: Date pt extubated/weaned off vent Outcome: Completed/Met Date Met:  07/03/11 Extubated 11-17

## 2011-07-03 NOTE — Progress Notes (Signed)
Speech Language/Pathology Clinical/Bedside Swallow Evaluation Patient Details  Name: [name] MRN: [id number] DOB: 03-12-1946 Today's Date: 07/03/2011  Past Medical History:  Past Medical History  Diagnosis Date  . Hypertension   . Stroke     Multiple  . Seizures     partial and generalized, poor compliance   Past Surgical History:  Past Surgical History  Procedure Date  . Coronary artery bypass graft     Assessment/Recommendations/Treatment Plan    SLP Assessment Clinical Impression Statement: Indicates moderate oropharyngeal dysphagia with outward s/s of aspiration s/p swallow of thin liquids and soft and regular consistency solids. Recommend full supervision with all meals due to noted confusion. Objective evaluation to be determined . Risk for Aspiration: Moderate Other Related Risk Factors: Cognitive impairment;Lethargy;Previous CVA  Recommendations Solid Consistency: Dysphagia 1 (Puree) Liquid Consistency: Honey Liquid Administration via: Cup;No straw Medication Administration: Crushed with puree Supervision: Staff feed patient Compensations: Slow rate;Small sips/bites;Multiple dry swallows after each bite/sip;Follow solids with liquid;Clear throat intermittently;Effortful swallow Postural Changes and/or Swallow Maneuvers: Seated upright 90 degrees;Upright 30-60 min after meal Oral Care Recommendations: Oral care before and after PO Other Recommendations: Order thickener from pharmacy;Prohibited food (jello, ice cream, thin soups);Clarify dietary restrictions;Remove water pitcher  Prognosis Prognosis for Safe Diet Advancement: Good Barriers to Reach Goals: Cognitive deficits  Individuals Consulted Consulted and Agree with Results and Recommendations: Patient;Family member/caregiver;RN  Swallow Study Goals  SLP Swallowing Goals Patient will consume recommended diet without observed clinical signs of aspiration with: Moderate assistance Patient will utilize  recommended strategies during swallow to increase swallowing safety with: Moderate assistance  Swallow Study Prior Functional Status     General  Date of Onset: 07/02/11 Type of Study: Bedside swallow evaluation Diet Prior to this Study: Regular;Thin liquids Respiratory Status: Supplemental O2 delivered via (comment) (nasal cannula) History of Intubation: Yes Length of Intubations (days): 1 days Date extubated: 07/03/11 Behavior/Cognition: Cooperative;Confused;Lethargic Oral Cavity - Dentition: Adequate natural dentition Patient Positioning: Upright in bed Baseline Vocal Quality: Hoarse;Low vocal intensity Volitional Cough: Strong Volitional Swallow: Able to elicit Ice chips: Tested (comment)  Oral Motor/Sensory Function  Labial ROM: Within Functional Limits Labial Symmetry: Abnormal symmetry right Labial Strength: Within Functional Limits Labial Sensation: Reduced Lingual ROM: Within Functional Limits Lingual Symmetry: Within Functional Limits Lingual Strength: Within Functional Limits Lingual Sensation: Within Functional Limits Facial ROM: Within Functional Limits Facial Symmetry: Right droop Facial Strength: Within Functional Limits Facial Sensation: Reduced Velum: Within Functional Limits Mandible: Within Functional Limits  Consistency Results  Ice Chips Ice chips: Impaired Pharyngeal Phase Impairments: Cough - Immediate;Delayed Swallow  Thin Liquid Thin Liquid: Impaired Oral Phase Functional Implications: Oral holding Pharyngeal  Phase Impairments: Delayed Swallow;Cough - Immediate;Wet Vocal Quality  Nectar Thick Liquid Nectar Thick Liquid: Impaired Pharyngeal Phase Impairments: Delayed Swallow;Cough - Delayed  Honey Thick Liquid Honey Thick Liquid: Impaired Pharyngeal Phase Impairments: Delayed Swallow  Puree Puree: Impaired Pharyngeal Phase Impairments: Delayed Swallow  Solid Solid: Impaired Pharyngeal Phase Impairments: Delayed Swallow;Cough -  Delayed  [name] M.S., CCC-SLP [phone]  [facility] 07/03/2011,12:47 PM

## 2011-07-04 ENCOUNTER — Inpatient Hospital Stay (HOSPITAL_COMMUNITY): Payer: Medicare Other

## 2011-07-04 DIAGNOSIS — G40309 Generalized idiopathic epilepsy and epileptic syndromes, not intractable, without status epilepticus: Secondary | ICD-10-CM

## 2011-07-04 DIAGNOSIS — G459 Transient cerebral ischemic attack, unspecified: Secondary | ICD-10-CM

## 2011-07-04 DIAGNOSIS — F0390 Unspecified dementia without behavioral disturbance: Secondary | ICD-10-CM

## 2011-07-04 DIAGNOSIS — R0902 Hypoxemia: Secondary | ICD-10-CM

## 2011-07-04 DIAGNOSIS — J9600 Acute respiratory failure, unspecified whether with hypoxia or hypercapnia: Secondary | ICD-10-CM

## 2011-07-04 LAB — BLOOD GAS, ARTERIAL
Bicarbonate: 25.4 mEq/L — ABNORMAL HIGH (ref 20.0–24.0)
TCO2: 26.5 mmol/L (ref 0–100)
pCO2 arterial: 37.2 mmHg (ref 35.0–45.0)
pH, Arterial: 7.451 — ABNORMAL HIGH (ref 7.350–7.450)
pO2, Arterial: 69.9 mmHg — ABNORMAL LOW (ref 80.0–100.0)

## 2011-07-04 LAB — CBC
HCT: 37.8 % — ABNORMAL LOW (ref 39.0–52.0)
Hemoglobin: 12.5 g/dL — ABNORMAL LOW (ref 13.0–17.0)
MCV: 91.1 fL (ref 78.0–100.0)
RBC: 4.15 MIL/uL — ABNORMAL LOW (ref 4.22–5.81)
RDW: 14.3 % (ref 11.5–15.5)
WBC: 10.5 10*3/uL (ref 4.0–10.5)

## 2011-07-04 LAB — BASIC METABOLIC PANEL
BUN: 8 mg/dL (ref 6–23)
CO2: 27 mEq/L (ref 19–32)
Chloride: 105 mEq/L (ref 96–112)
Creatinine, Ser: 0.93 mg/dL (ref 0.50–1.35)
Glucose, Bld: 92 mg/dL (ref 70–99)

## 2011-07-04 LAB — PHENYTOIN LEVEL, TOTAL: Phenytoin Lvl: 11.9 ug/mL (ref 10.0–20.0)

## 2011-07-04 MED ORDER — SIMVASTATIN 40 MG PO TABS
40.0000 mg | ORAL_TABLET | Freq: Every day | ORAL | Status: DC
  Filled 2011-07-04 (×2): qty 1

## 2011-07-04 MED ORDER — LEVETIRACETAM 500 MG PO TABS
500.0000 mg | ORAL_TABLET | Freq: Every day | ORAL | Status: DC
  Administered 2011-07-04 – 2011-07-05 (×2): 500 mg via ORAL
  Filled 2011-07-04 (×2): qty 1

## 2011-07-04 MED ORDER — LISINOPRIL 40 MG PO TABS
40.0000 mg | ORAL_TABLET | Freq: Every day | ORAL | Status: DC
  Administered 2011-07-04 – 2011-07-07 (×4): 40 mg via ORAL
  Filled 2011-07-04 (×4): qty 1

## 2011-07-04 MED ORDER — FOOD THICKENER (THICKENUP CLEAR)
1.0000 | ORAL | Status: DC | PRN
  Filled 2011-07-04 (×2): qty 1.4

## 2011-07-04 MED ORDER — FOLIC ACID 1 MG PO TABS
1.0000 mg | ORAL_TABLET | Freq: Every day | ORAL | Status: DC
Start: 2011-07-04 — End: 2011-07-07
  Administered 2011-07-04 – 2011-07-07 (×4): 1 mg via ORAL
  Filled 2011-07-04 (×4): qty 1

## 2011-07-04 MED ORDER — NIFEDIPINE ER 60 MG PO TB24
60.0000 mg | ORAL_TABLET | Freq: Every day | ORAL | Status: DC
  Administered 2011-07-04 – 2011-07-06 (×3): 60 mg via ORAL
  Filled 2011-07-04 (×4): qty 1

## 2011-07-04 MED ORDER — HYDROCHLOROTHIAZIDE 25 MG PO TABS
25.0000 mg | ORAL_TABLET | Freq: Every day | ORAL | Status: DC
  Administered 2011-07-04 – 2011-07-07 (×4): 25 mg via ORAL
  Filled 2011-07-04 (×4): qty 1

## 2011-07-04 MED ORDER — ASPIRIN EC 325 MG PO TBEC
325.0000 mg | DELAYED_RELEASE_TABLET | Freq: Every day | ORAL | Status: DC
  Administered 2011-07-04 – 2011-07-05 (×2): 325 mg via ORAL
  Filled 2011-07-04 (×3): qty 1

## 2011-07-04 MED ORDER — PHENYTOIN SODIUM EXTENDED 100 MG PO CAPS
100.0000 mg | ORAL_CAPSULE | Freq: Four times a day (QID) | ORAL | Status: DC
  Administered 2011-07-04 – 2011-07-07 (×11): 100 mg via ORAL
  Filled 2011-07-04 (×15): qty 1

## 2011-07-04 MED ORDER — POTASSIUM CHLORIDE 20 MEQ/15ML (10%) PO LIQD
40.0000 meq | Freq: Every day | ORAL | Status: DC
  Filled 2011-07-04: qty 30

## 2011-07-04 MED ORDER — POTASSIUM CHLORIDE CRYS ER 20 MEQ PO TBCR
40.0000 meq | EXTENDED_RELEASE_TABLET | Freq: Every day | ORAL | Status: DC
  Administered 2011-07-04 – 2011-07-07 (×4): 40 meq via ORAL
  Filled 2011-07-04: qty 2
  Filled 2011-07-04: qty 1
  Filled 2011-07-04 (×2): qty 2

## 2011-07-04 MED ORDER — CLOPIDOGREL BISULFATE 75 MG PO TABS
75.0000 mg | ORAL_TABLET | Freq: Every day | ORAL | Status: DC
  Administered 2011-07-04 – 2011-07-05 (×2): 75 mg via ORAL
  Filled 2011-07-04 (×3): qty 1

## 2011-07-04 NOTE — Progress Notes (Signed)
Subjective: Feels fine, wants to go home.  Family in attendance, no reported seizure overnight.  Objective: BP 118/73  Pulse 76  Temp(Src) 99 F (37.2 C) (Oral)  Resp 18  Ht 5\' 11"  (1.803 m)  Wt 84.7 kg (186 lb 11.7 oz)  BMI 26.04 kg/m2  SpO2 99%  Intake/Output from previous day: 11/17 0701 - 11/18 0700 In: 634.7 [I.V.:74.7; IV Piggyback:560] Out: 685 [Urine:685] Intake/Output this shift:    Medications:  Scheduled:   . antiseptic oral rinse  15 mL Mouth Rinse QID  . chlorhexidine  15 mL Mouth Rinse BID  . diltiazem  30 mg Oral Q6H  . heparin  5,000 Units Subcutaneous Q8H  . magnesium sulfate IVPB  2 g Intravenous Once  . pantoprazole  40 mg Oral Q1200  . PHENObarbital  32.5 mg Oral BID  . phenytoin  100 mg Oral TID  . potassium chloride  40 mEq Oral Daily  . potassium phosphate IVPB (mmol)  30 mmol Intravenous Once  . DISCONTD: feeding supplement  30 mL Per Tube BID  . DISCONTD: magnesium  2 g Intravenous Once  . DISCONTD: pantoprazole (PROTONIX) IV  40 mg Intravenous QHS  . DISCONTD: PHENObarbital  30 mg Oral BID  . DISCONTD: phenytoin  300 mg Oral TID   Neurologic Exam: Mental Status: Alert, oriented to self.   Speech fluent without evidence of aphasia. Some difficulty transitioning to new commands after performing one.  II- Visual fields grossly intact. III/IV/VI-Extraocular movements intact.  Pupils reactive bilaterally. V/VII-Smile symmetric XI-bilateral shoulder shrug XII-midline tongue extension Motor: 5/5 UE bilaterally, -3/5 bilateral LE's Sensory:  light touch and discrimination intact throughout, bilaterally Cerebellar: Normal finger-to-nose.  Lab Results:  [facility] 07/04/11 0555 07/03/11 0510  WBC 10.5 11.1*  HGB 12.5* 13.3  HCT 37.8* 39.8  PLT 183 184  NA 142 140  K 3.7 3.6  CL 105 104  CO2 27 25  GLUCOSE 92 123*  BUN 8 11  CREATININE 0.93 0.85  CALCIUM 9.0 9.3  LABA1C -- --   Lipid Panel No results found for this basename:  CHOL,TRIG,HDL,CHOLHDL,VLDL,LDLCALC in the last 72 hours  Studies/Results: Ct Head Wo Contrast  07/02/2011  *RADIOLOGY REPORT*  Clinical Data: Left-sided weakness, aphasia  CT HEAD WITHOUT CONTRAST  Technique:  Contiguous axial images were obtained from the base of the skull through the vertex without contrast.  Comparison: MRI brain dated 07/22/2010  Findings: Hypodensity in the right parietal region (series 2/image 22), possibly reflecting an age indeterminate infarct, new from prior MRI.  No evidence of parenchymal hemorrhage or extra-axial fluid collection.  No mass lesion, mass effect, or midline shift.  Extensive subcortical white matter and periventricular small vessel ischemic changes.  Intracranial atherosclerosis.  Global cortical atrophy with secondary ventriculomegaly.  The visualized paranasal sinuses are essentially clear. The mastoid air cells are unopacified.  No evidence of calvarial fracture.  IMPRESSION: Hypodensity in the right parietal region, possibly reflecting an age indeterminate infarct.  Extensive small vessel ischemic changes with atrophy and intracranial atherosclerosis.  Original Report Authenticated By: Charline Bills, M.D.   Mr Brain Wo Contrast  07/03/2011  *RADIOLOGY REPORT*  Clinical Data: Left-sided weakness  MRI HEAD WITHOUT CONTRAST  Technique:  Multiplanar, multiecho pulse sequences of the brain and surrounding structures were obtained according to standard protocol without intravenous contrast.  Comparison: CT 07/02/2011  Findings: Acute infarct in the right frontal cortex over the convexity.  Advanced atrophy for age.  Chronic ischemic changes throughout the cerebral white matter bilaterally.  Mild chronic ischemia in the pons.  Perivascular cysts are noted in the basal ganglia bilaterally.  Chronic infarct left external capsule posteriorly.  Negative for hemorrhage or mass lesion.  IMPRESSION: Acute infarct right frontal cortex over the convexity measuring  approximately 15 mm.  Advanced atrophy.  Chronic microvascular ischemia.  Original Report Authenticated By: Camelia Phenes, M.D.   Dg Chest Port 1 View  07/02/2011  *RADIOLOGY REPORT*  Clinical Data: Post intubation  PORTABLE CHEST - 1 VIEW  Comparison: 08/19/2005  Findings: Endotracheal tube terminates 5 cm above the carina.  The lungs are essentially clear. No pleural effusion or pneumothorax.  Mild cardiomegaly. Postsurgical changes related to prior CABG.  IMPRESSION: Endotracheal tube terminates 5 cm above the carina.  Original Report Authenticated By: Charline Bills, M.D.      Assessment/Plan: Seizure (07/02/2011) Assessment: Patient without further seizures since admission. Phenytoin 11.9, Plan:  1.  Phenobarbital 30mg  bid today  2.  Dilantin to be started today at 100 mg TID Stroke (07/02/2011) Assessment: MRI Acute infarct right frontal cortex Plan: 1. Will initiate new stroke work up:   1. HgbA1c, fasting lipid panel  2. MRI, MRA  of the brain without contrast  3. PT consult, OT consult, Speech consult  4. Echocardiogram  5. Carotid dopplers  6. Prophylactic therapy-Antiplatelet med: Aspirin 325 mg QD  . Risk  Factor modification 2. Stroke service to follow beginning tomorrow.    LOS: 2 days   Marya Fossa PA-C [facility] [phone] 07/04/2011  8:43 AM

## 2011-07-04 NOTE — Progress Notes (Signed)
*  PRELIMINARY RESULTS*   Carotid Dopplers completed.  There is no significant ICA stenosis.  Vertebral artery flow is antegrade.    Sherren Kerns Renee 07/04/2011, 12:26 PM

## 2011-07-04 NOTE — Progress Notes (Signed)
Physical Therapy Evaluation Patient Details Name: [name] MRN: [id number] DOB: October 06, 1945 Today's Date: 07/04/2011  Problem List:  Patient Active Problem List  Diagnoses  . Seizure  . Stroke  . Tachycardia  . Dementia  . Acute respiratory failure    Past Medical History:  Past Medical History  Diagnosis Date  . Hypertension   . Stroke     Multiple  . Seizures     partial and generalized, poor compliance   Past Surgical History:  Past Surgical History  Procedure Date  . Coronary artery bypass graft     PT Assessment/Plan/Recommendation PT Assessment Clinical Impression Statement: Patient is a 65 yo male admitted with seizures.  MRI showed acute infarct right frontal cortex.  Patient with left hemiparesis and decreased mobility/safety.  Recommend Inpatient Rehab consult PT Recommendation/Assessment: Patient will need skilled PT in the acute care venue PT Problem List: Decreased strength;Decreased activity tolerance;Decreased balance;Decreased mobility;Decreased coordination;Decreased knowledge of use of DME;Impaired sensation PT Therapy Diagnosis : Difficulty walking;Abnormality of gait;Hemiplegia non-dominant side PT Plan PT Frequency: Min 4X/week PT Treatment/Interventions: DME instruction;Gait training;Functional mobility training;Balance training;Neuromuscular re-education;Patient/family education PT Recommendation Recommendations for Other Services: Rehab consult Follow Up Recommendations: Inpatient Rehab Equipment Recommended: Defer to next venue PT Goals  Acute Rehab PT Goals PT Goal Formulation: With patient Time For Goal Achievement: 2 weeks Pt will Roll Supine to Right Side: Independently PT Goal: Rolling Supine to Right Side - Progress: Not met Pt will go Supine/Side to Sit: Independently PT Goal: Supine/Side to Sit - Progress: Not met Pt will go Sit to Supine/Side: Independently PT Goal: Sit to Supine/Side - Progress: Not met Pt will Transfer Sit  to Stand/Stand to Sit: with supervision;with upper extremity assist PT Transfer Goal: Sit to Stand/Stand to Sit - Progress: Not met Pt will Ambulate: >150 feet;with supervision;with least restrictive assistive device PT Goal: Ambulate - Progress: Not met  PT Evaluation Precautions/Restrictions  Precautions Precautions: Fall Precaution Comments: Decreased balance with gait - fall risk Required Braces or Orthoses: No Restrictions Weight Bearing Restrictions: No Prior Functioning  Home Living Lives With: Spouse Type of Home: House Home Layout: One level Home Access: Stairs to enter Entrance Stairs-Rails: None Entrance Stairs-Number of Steps: 4 Bathroom Shower/Tub: Health visitor: Standard Bathroom Accessibility: Yes How Accessible: Accessible via walker Home Adaptive Equipment: Bedside commode/3-in-1;Walker - rolling;Wheelchair - powered;Wheelchair - Architectural technologist with back Prior Function Level of Independence: Independent with basic ADLs;Independent with gait Driving: Yes Cognition Cognition Arousal/Alertness: Awake/alert Overall Cognitive Status:  (At times, slow to process information) Orientation Level: Oriented X4 Sensation/Coordination Sensation Light Touch: Appears Intact Additional Comments: Noted decreased peripheral vision on left, impacting function (running into objects on left). Coordination Gross Motor Movements are Fluid and Coordinated: No Coordination and Movement Description: Decreased coordination left LE Extremity Assessment RUE Assessment RUE Assessment: Within Functional Limits LUE Assessment LUE Assessment: Exceptions to WFL LUE AROM (degrees) Overall AROM Left Upper Extremity: Within functional limits for tasks assessed LUE Strength LUE Overall Strength: Deficits (Decreased strength 4+/5) RLE Assessment RLE Assessment: Within Functional Limits LLE Assessment LLE Assessment: Exceptions to WFL LLE AROM (degrees) Overall  AROM Left Lower Extremity: Within functional limits for tasks assessed LLE Strength LLE Overall Strength: Deficits (Decreased strength 4+/5 overall) Mobility (including Balance) Bed Mobility Bed Mobility: No Transfers Transfers: Yes Sit to Stand: 4: Min assist;With upper extremity assist;From chair/3-in-1;With armrests Sit to Stand Details (indicate cue type and reason): Verbal and tactile cues to move trunk over feet to stand.  Physical assist  to clear hips from chair Stand to Sit: 4: Min assist;With upper extremity assist;With armrests;To chair/3-in-1 Stand to Sit Details: Cues for technique.  Assist to slowly lower self to chair Ambulation/Gait Ambulation/Gait: Yes Ambulation/Gait Assistance: 1: +2 Total assist;Patient percentage (comment) (Patient = 75%) Ambulation/Gait Assistance Details (indicate cue type and reason): Posterior lean.  Cues to move trunk forward over feet.  Assist needed to maneuver RW (ran into bed and door on left). LLE difficulty advancing with swing phase of gait. Ambulation Distance (Feet): 25 Feet Assistive device: Rolling walker Gait Pattern: Step-through pattern;Decreased step length - left;Decreased stance time - left;Trunk flexed Gait velocity: Slow gait speed Stairs: No  Balance Balance Assessed: Yes Static Standing Balance Static Standing - Balance Support: Bilateral upper extremity supported Static Standing - Level of Assistance: 3: Mod assist Static Standing - Comment/# of Minutes: Significant lean posteriorly.     End of Session PT - End of Session Equipment Utilized During Treatment: Gait belt Activity Tolerance: Patient limited by fatigue Patient left: in chair;with call bell in reach Nurse Communication: Mobility status for ambulation;Mobility status for transfers General Behavior During Session: [facility] for tasks performed Cognition: [facility] for tasks performed  Darl Pikes H. Earlene Plater PT, [facility] Acute Rehab Services Pager [phone] Vena [place] 07/04/2011, 11:47 AM

## 2011-07-04 NOTE — Progress Notes (Signed)
HPI:  Patient is a 65 y/o AAM with PMHx of seizures, strokes (about 6 as per wife), HTN, CAD s/p CABG, tobacco abuse dementia, who was admitted to [facility] due to altered mental status and developed a seizure while in the ER  As per wife, pt went to bed last night ok and today he was not talking and very drowsy. She noticed today more L sided weakness than baseline due to previous strokes. He has not been compliant with his seizure meds (dilatin and Phenobarbital). In the ER, pt developed a seizure and was intubated for airway protection. He was also given cardizem for a fib with RVR.  Antibiotics:   None  Cultures/Sepsis Markers:   None  Access/Protocols:  ET Tube 11/16>>>11/17  Best Practice: DVT: SCD's and SQ hep. GI: Protonix  Subjective: Extubated yesterday and doing well.  Physical Exam: Filed Vitals:   07/04/11 0500  BP: 118/73  Pulse: 76  Temp:   Resp: 18    Intake/Output Summary (Last 24 hours) at 07/04/11 0806 Last data filed at 07/04/11 0300  Gross per 24 hour  Intake 594.33 ml  Output    650 ml  Net -55.67 ml   Vent Mode:  [-]  FiO2 (%):  [40 %] 40 % Pressure Support:  [5 cmH20] 5 cmH20  Neuro: Alert and oriented.  Moving all ext with left sided weakness. Cardiac: RRR, Nl S1/S2, -M/R/G. Pulmonary: CTA bilaterally. GI: Soft, NT, ND and +BS. Extremities: -edema and -tenderness.  Labs: CBC    Component Value Date/Time   WBC 10.5 07/04/2011 0555   RBC 4.15* 07/04/2011 0555   HGB 12.5* 07/04/2011 0555   HCT 37.8* 07/04/2011 0555   PLT 183 07/04/2011 0555   MCV 91.1 07/04/2011 0555   MCH 30.1 07/04/2011 0555   MCHC 33.1 07/04/2011 0555   RDW 14.3 07/04/2011 0555   LYMPHSABS 1.5 07/02/2011 1629   MONOABS 0.5 07/02/2011 1629   EOSABS 0.1 07/02/2011 1629   BASOSABS 0.0 07/02/2011 1629    BMET    Component Value Date/Time   NA 142 07/04/2011 0555   K 3.7 07/04/2011 0555   CL 105 07/04/2011 0555   CO2 27 07/04/2011 0555   GLUCOSE 92 07/04/2011 0555   BUN 8 07/04/2011 0555   CREATININE 0.93 07/04/2011 0555   CALCIUM 9.0 07/04/2011 0555   GFRNONAA 87* 07/04/2011 0555   GFRAA >90 07/04/2011 0555    Lab 07/04/11 0555  MG 2.2   Lab Results  Component Value Date   CALCIUM 9.0 07/04/2011   PHOS 3.0 07/04/2011    Chest Xray:   Assessment & Plan:  Acute respiratory failure (07/02/11)   Assessment: due to post ictal state, minimal vent/oxygen needs.  Extubated and much improved now.   Plan:  IS and flutter valve.  Titrate O2 down for sat of 92-95%.  PRN albuterol.  Tachycardia (07/03/2011)   Assessment: Mostly with activity, on home cardiazem.   Plan: Home cardiazem restarted with seeming stabelization of HR and BP.  Dementia (07/03/2011)   Assessment: Vascular in nature and unable to effectively manage ADL, on aricept and namenda.   Plan: Will hold dementia meds until MRI is complete or when ok with neuro.  Seizure (07/02/2011)   Assessment: After a total of 6 CVA's and now not compliant with meds.  Usually on dilantin and phenobarb.   Plan:  Continue antiepileptic meds.  MRI per neuro, will defer to neuro on time frame on when ok to get.  Appreciate neuro  input.  Stroke (07/02/2011)   Assessment: Residual left sided weakness and dementia.   Plan:  Per neuro.  MRI orders will be deferred to neuro if needed.  FEN: Passed swallow evaluation but with thickened fluid, will need to be repeated in 24-48 hours after residual effects of intubation has resolved.  Transfer to Tele with PCCM signing off, please call back if needed.   Patient updated bedside.  Koren Bound, MD

## 2011-07-04 NOTE — Progress Notes (Signed)
*  PRELIMINARY RESULTS* Echocardiogram 2D Echocardiogram has been performed.  Glean Salen [facility] 07/04/2011, 12:22 PM

## 2011-07-05 DIAGNOSIS — R569 Unspecified convulsions: Secondary | ICD-10-CM

## 2011-07-05 DIAGNOSIS — R5381 Other malaise: Secondary | ICD-10-CM

## 2011-07-05 LAB — PHOSPHORUS: Phosphorus: 2.6 mg/dL (ref 2.3–4.6)

## 2011-07-05 LAB — CBC
Hemoglobin: 12.5 g/dL — ABNORMAL LOW (ref 13.0–17.0)
MCH: 30.7 pg (ref 26.0–34.0)
MCHC: 34.1 g/dL (ref 30.0–36.0)
Platelets: 182 10*3/uL (ref 150–400)
RBC: 4.07 MIL/uL — ABNORMAL LOW (ref 4.22–5.81)
RDW: 14.3 % (ref 11.5–15.5)
WBC: 9.4 10*3/uL (ref 4.0–10.5)

## 2011-07-05 LAB — BASIC METABOLIC PANEL
Calcium: 9.1 mg/dL (ref 8.4–10.5)
GFR calc non Af Amer: 88 mL/min — ABNORMAL LOW (ref >90)
Sodium: 139 mEq/L (ref 135–145)

## 2011-07-05 LAB — LIPID PANEL
LDL Cholesterol: 84 mg/dL (ref 0–99)
Triglycerides: 89 mg/dL (ref <150)
VLDL: 18 mg/dL (ref 0–40)

## 2011-07-05 LAB — PHENOBARBITAL LEVEL: Phenobarbital: 17.4 ug/mL (ref 15.0–40.0)

## 2011-07-05 LAB — MAGNESIUM: Magnesium: 1.9 mg/dL (ref 1.5–2.5)

## 2011-07-05 MED ORDER — LEVETIRACETAM 500 MG PO TABS
500.0000 mg | ORAL_TABLET | Freq: Two times a day (BID) | ORAL | Status: DC
  Administered 2011-07-05 – 2011-07-07 (×4): 500 mg via ORAL
  Filled 2011-07-05 (×5): qty 1

## 2011-07-05 MED ORDER — PHENOBARBITAL 32.4 MG PO TABS
32.4000 mg | ORAL_TABLET | Freq: Two times a day (BID) | ORAL | Status: DC
  Administered 2011-07-05 – 2011-07-07 (×5): 32.4 mg via ORAL
  Filled 2011-07-05 (×4): qty 1

## 2011-07-05 MED ORDER — ROSUVASTATIN CALCIUM 10 MG PO TABS
10.0000 mg | ORAL_TABLET | Freq: Every day | ORAL | Status: DC
  Administered 2011-07-05 – 2011-07-06 (×2): 10 mg via ORAL
  Filled 2011-07-05 (×3): qty 1

## 2011-07-05 MED ORDER — ENSURE CLINICAL ST REVIGOR PO LIQD
237.0000 mL | Freq: Every day | ORAL | Status: DC
  Administered 2011-07-05 – 2011-07-07 (×3): 237 mL via ORAL

## 2011-07-05 NOTE — Progress Notes (Signed)
Subjective: Patient seen.Denies any chest pain or sob.No seizure activity  Objective: Weight change:   Intake/Output Summary (Last 24 hours) at 07/05/11 0905 Last data filed at 07/04/11 1100  Gross per 24 hour  Intake    120 ml  Output      0 ml  Net    120 ml   BP 121/79  Pulse 76  Temp(Src) 97.7 F (36.5 C) (Oral)  Resp 18  Ht 5\' 11"  (1.803 m)  Wt 84.7 kg (186 lb 11.7 oz)  BMI 26.04 kg/m2  SpO2 96% Physical Exam: General appearance: alert, cooperative and no distress Head: Normocephalic, without obvious abnormality, atraumatic Neck: no adenopathy, no carotid bruit, no JVD, supple, symmetrical, trachea midline and thyroid not enlarged, symmetric, no tenderness/mass/nodules Lungs: clear to auscultation bilaterally Heart: regular rate and rhythm, S1, S2 normal, no murmur, click, rub or gallop Abdomen: soft, non-tender; bowel sounds normal; no masses,  no organomegaly Extremities: extremities normal, atraumatic, no cyanosis or edema Skin: Skin color, texture, turgor normal. No rashes or lesions Neuro-non focal  Lab Results: @labtest @  Micro Results: Recent Results (from the past 240 hour(s))  MRSA PCR SCREENING     Status: Normal   Collection Time   07/02/11 10:23 PM      Component Value Range Status Comment   MRSA by PCR NEGATIVE  NEGATIVE  Final     Studies/Results: Ct Head Wo Contrast  07/02/2011  *RADIOLOGY REPORT*  Clinical Data: Left-sided weakness, aphasia  CT HEAD WITHOUT CONTRAST  Technique:  Contiguous axial images were obtained from the base of the skull through the vertex without contrast.  Comparison: MRI brain dated 07/22/2010  Findings: Hypodensity in the right parietal region (series 2/image 22), possibly reflecting an age indeterminate infarct, new from prior MRI.  No evidence of parenchymal hemorrhage or extra-axial fluid collection.  No mass lesion, mass effect, or midline shift.  Extensive subcortical white matter and periventricular small vessel  ischemic changes.  Intracranial atherosclerosis.  Global cortical atrophy with secondary ventriculomegaly.  The visualized paranasal sinuses are essentially clear. The mastoid air cells are unopacified.  No evidence of calvarial fracture.  IMPRESSION: Hypodensity in the right parietal region, possibly reflecting an age indeterminate infarct.  Extensive small vessel ischemic changes with atrophy and intracranial atherosclerosis.  Original Report Authenticated By: Charline Bills, M.D.   Mr Brain Wo Contrast  07/03/2011  *RADIOLOGY REPORT*  Clinical Data: Left-sided weakness  MRI HEAD WITHOUT CONTRAST  Technique:  Multiplanar, multiecho pulse sequences of the brain and surrounding structures were obtained according to standard protocol without intravenous contrast.  Comparison: CT 07/02/2011  Findings: Acute infarct in the right frontal cortex over the convexity.  Advanced atrophy for age.  Chronic ischemic changes throughout the cerebral white matter bilaterally.  Mild chronic ischemia in the pons.  Perivascular cysts are noted in the basal ganglia bilaterally.  Chronic infarct left external capsule posteriorly.  Negative for hemorrhage or mass lesion.  IMPRESSION: Acute infarct right frontal cortex over the convexity measuring approximately 15 mm.  Advanced atrophy.  Chronic microvascular ischemia.  Original Report Authenticated By: Camelia Phenes, M.D.   Dg Chest Port 1 View  07/04/2011  *RADIOLOGY REPORT*  Clinical Data: Intubated  PORTABLE CHEST - 1 VIEW  Comparison: 07/02/2011  Findings: The patient has been extubated.  Previous CABG.  Minimal subsegmental atelectasis persists at the left lung base. Right lung clear.  Heart size normal.  No effusion.  IMPRESSION:  1.  Extubation with minimal subsegmental atelectasis at the  left lung base.  Original Report Authenticated By: Osa Craver, M.D.   Dg Chest Port 1 View  07/02/2011  *RADIOLOGY REPORT*  Clinical Data: Post intubation  PORTABLE  CHEST - 1 VIEW  Comparison: 08/19/2005  Findings: Endotracheal tube terminates 5 cm above the carina.  The lungs are essentially clear. No pleural effusion or pneumothorax.  Mild cardiomegaly. Postsurgical changes related to prior CABG.  IMPRESSION: Endotracheal tube terminates 5 cm above the carina.  Original Report Authenticated By: Charline Bills, M.D.   Medications: Scheduled Meds:   . aspirin EC  325 mg Oral Daily  . chlorhexidine  15 mL Mouth Rinse BID  . clopidogrel  75 mg Oral Daily  . diltiazem  30 mg Oral Q6H  . folic acid  1 mg Oral Daily  . heparin  5,000 Units Subcutaneous Q8H  . hydrochlorothiazide  25 mg Oral Daily  . levETIRAcetam  500 mg Oral Daily  . lisinopril  40 mg Oral Daily  . NIFEdipine  60 mg Oral Daily  . pantoprazole  40 mg Oral Q1200  . PHENObarbital  32.5 mg Oral BID  . phenytoin  100 mg Oral TID  . phenytoin  100 mg Oral QID  . potassium chloride  40 mEq Oral Daily  . simvastatin  40 mg Oral q1800  . DISCONTD: antiseptic oral rinse  15 mL Mouth Rinse QID  . 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

## 2011-07-05 NOTE — Progress Notes (Signed)
Occupational Therapy Evaluation Patient Details Name: [name] MRN: [id number] DOB: 1946/06/27 Today's Date: 07/05/2011  Problem List:  Patient Active Problem List  Diagnoses  . Seizure  . Stroke  . Tachycardia  . Dementia  . Acute respiratory failure    Past Medical History:  Past Medical History  Diagnosis Date  . Hypertension   . Stroke     Multiple  . Seizures     partial and generalized, poor compliance   Past Surgical History:  Past Surgical History  Procedure Date  . Coronary artery bypass graft     OT Assessment/Plan/Recommendation OT Assessment OT Recommendation/Assessment: Patient will need skilled OT in the acute care venue OT Problem List: Decreased strength OT Therapy Diagnosis : Generalized weakness OT Plan OT Frequency: Min 2X/week OT Treatment/Interventions: Self-care/ADL training;Therapeutic exercise;DME and/or AE instruction;Balance training;Patient/family education OT Recommendation Recommendations for Other Services: Rehab consult Follow Up Recommendations: Inpatient Rehab;Home health OT;Other (comment) (pt wants to DC home) Equipment Recommended: Defer to next venue Individuals Consulted Consulted and Agree with Results and Recommendations: Patient OT Goals Acute Rehab OT Goals OT Goal Formulation: With patient ADL Goals Pt Will Perform Grooming: Standing at sink ADL Goal: Grooming - Progress: Progressing toward goals Pt Will Perform Upper Body Bathing: with set-up ADL Goal: Upper Body Bathing - Progress: Progressing toward goals Pt Will Perform Lower Body Bathing: with set-up;Sit to stand from chair ADL Goal: Lower Body Bathing - Progress: Progressing toward goals Pt Will Perform Upper Body Dressing: with set-up;Sitting, chair;Unsupported Pt Will Perform Lower Body Dressing: with set-up;Sit to stand from chair ADL Goal: Lower Body Dressing - Progress: Progressing toward goals Pt Will Transfer to Toilet: with set-up;Comfort height  toilet ADL Goal: Toilet Transfer - Progress: Progressing toward goals Pt Will Perform Tub/Shower Transfer: Shower transfer  OT Evaluation Precautions/Restrictions  Precautions Precautions: Fall Precaution Comments: Decreased balance with gait - fall risk Required Braces or Orthoses: No Restrictions Weight Bearing Restrictions: No Prior Functioning     ADL ADL Eating/Feeding: Set up Where Assessed - Eating/Feeding: Chair Grooming: Performed;Minimal assistance Where Assessed - Grooming: Standing at sink Upper Body Bathing: Simulated Where Assessed - Upper Body Bathing: Unsupported;Sitting, bed Lower Body Bathing: Simulated;Moderate assistance Upper Body Dressing: Minimal assistance;Simulated Where Assessed - Upper Body Dressing: Sitting, chair;Unsupported Lower Body Dressing: Simulated;Minimal assistance Where Assessed - Lower Body Dressing: Sitting, chair;Unsupported Toilet Transfer: Simulated Toilet Transfer Method: Ambulating;Other (comment) (walker) Toilet Transfer Equipment: Regular height toilet;Comfort height toilet Toileting - Clothing Manipulation: Simulated;Minimal assistance Where Assessed - Toileting Clothing Manipulation: Standing Toileting - Hygiene: Minimal assistance Where Assessed - Toileting Hygiene: Standing Tub/Shower Transfer Method: Not assessed Vision/Perception  Vision - History Baseline Vision: No visual deficits Patient Visual Report: Peripheral vision impairment Vision - Assessment Eye Alignment: Within Functional Limits Additional Comments: decreased periphial testing on left Perception Perception: Within Functional Limits Cognition Cognition Arousal/Alertness: Awake/alert Orientation Level: Oriented X4 Sensation/Coordination Sensation Light Touch: Appears Intact Stereognosis: Appears Intact Hot/Cold: Appears Intact Proprioception: Appears Intact Coordination Fine Motor Movements are Fluid and Coordinated: Yes Finger Nose Finger Test:  performed Extremity Assessment RUE Assessment RUE Assessment: Within Functional Limits LUE Assessment LUE Assessment: Within Functional Limits LUE AROM (degrees) Overall AROM Left Upper Extremity: Within functional limits for tasks assessed LUE Strength LUE Overall Strength: Within Functional Limits for tasks assessed Mobility    Exercises   End of Session OT - End of Session Equipment Utilized During Treatment: Other (comment) (walker) Patient left: in chair;with call bell in reach General Behavior During Session: [facility] for tasks  performed   Kirt Boys 07/05/2011, 3:54 PM

## 2011-07-05 NOTE — Progress Notes (Signed)
CSW completed full assessment and FL2 and placed in shadow chart. CSW faxed out patient to [facility] for SNF. CSW will continue to follow.

## 2011-07-05 NOTE — Progress Notes (Signed)
Stroke Team Progress Note  SUBJECTIVE  [name] is a 65 y.o. male who presented with a generalized tonic-clonic seizure. He has a long-standing history of seizure disorder and takes phenobarbital, Dilantin, Keppra. MRI scan of the brain showed a small right frontal subcortical infarct. He has not had any further seizures since admission. He feels his back to his neurological baseline and would like to go home.he denies any focal neurological symptoms at the present time. OBJECTIVE Most recent Vital Signs: Temp: 97.9 F (36.6 C) (11/19 1005) Temp src: Oral (11/19 1005) BP: 100/66 mmHg (11/19 1005) Pulse Rate: 64  (11/19 1005) Respiratory Rate: 16 O2 Saturdation: 97%  Intake/Output from previous day: 11/18 0701 - 11/19 0700 In: 240 [P.O.:240] Out: 250 [Urine:250]  IV Fluid Intake:    Diet:  Dysphagia 3 thin liquids  Activity:  Up with assistance  DVT Prophylaxis:  subcutaneous heparin  Studies: CBC    Component Value Date/Time   WBC 9.4 07/05/2011 0610   RBC 4.07* 07/05/2011 0610   HGB 12.5* 07/05/2011 0610   HCT 36.7* 07/05/2011 0610   PLT 182 07/05/2011 0610   MCV 90.2 07/05/2011 0610   MCH 30.7 07/05/2011 0610   MCHC 34.1 07/05/2011 0610   RDW 14.3 07/05/2011 0610   LYMPHSABS 1.5 07/02/2011 1629   MONOABS 0.5 07/02/2011 1629   EOSABS 0.1 07/02/2011 1629   BASOSABS 0.0 07/02/2011 1629   CMP    Component Value Date/Time   NA 139 07/05/2011 0610   K 3.7 07/05/2011 0610   CL 102 07/05/2011 0610   CO2 27 07/05/2011 0610   GLUCOSE 96 07/05/2011 0610   BUN 7 07/05/2011 0610   CREATININE 0.90 07/05/2011 0610   CALCIUM 9.1 07/05/2011 0610   GFRNONAA 88* 07/05/2011 0610   GFRAA >90 07/05/2011 0610   COAGS No results found for this basename: INR, PROTIME   Lipid Panel    Component Value Date/Time   CHOL 163 07/05/2011 0610   TRIG 89 07/05/2011 0610   HDL 61 07/05/2011 0610   CHOLHDL 2.7 07/05/2011 0610   VLDL 18 07/05/2011 0610   LDLCALC 84 07/05/2011  0610   HgbA1C  No results found for this basename: HGBA1C   Urine Drug Screen  No results found for this basename: labopia, cocainscrnur, labbenz, amphetmu, thcu, labbarb    Alcohol Level No results found for this basename: eth     CXR:   Extubation with minimal subsegmental atelectasis at the left lung base.  CT of the brain: Hypodensity in the right parietal region, possibly reflecting an age indeterminate infarct. Extensive small vessel ischemic changes with atrophy and intracranial atherosclerosis.  MRI of the brain: Acute infarct right frontal cortex over the convexity measuring approximately 15 mm.  Advanced atrophy  MRA of the brain: not ordered  2D Echocardiogram: EF 55-60% with no source of embolus.  Carotid Doppler:  Right: intimal thickening CCA. Mild mixed plaque origin ICA with acoustic shadowing. Left: Mild soft plaque origin ICA. Bilateral: no significant ICA stenosis. Vertebral artery flow is antegrade.  EKG: normal sinus rhythm, RBBB is gone that was present previously  Physical Exam:   Pleasant middle-aged African American gentleman not in distress. Neck is supple without bruit. Cardiac exam no murmur or gallop. Lungs are clear to auscultation.  Neurological exam awake alert oriented to time place and person. Follow simple commands well. Diminished attention, short-term memory and recall. No aphasia, dysarthria or apraxia. Eye movements are full range without nystagmus. Face is symmetric without weakness.  Motor system exam reveals no upper or lower extremity drift. No focal weakness. Mild diminished fine finger movements on the left hand. Orbits the right and her left upper extremity. Mild truncal ataxia. Gait deferred. ASSESSMENT [name] is a 65 y.o. male with a acute infarct right frontal cortex secondary to small vessel disease.  Seizure disorder with a breakthrough seizure secondary to new stroke. he is on multiple anticonvulsants.  Stroke risk  factors:  hypertension and previous stroke  Hospital day # 3  TREATMENT/PLAN  check TCD. Agree with rehab consults. Increase Keppra to 500 mg twice daily. Give dilantin 4x/day. Ok to continue phenobarb. No need to check further levels of any of these drugs.Physical and occupational therapy consults.Kindly call for questions.  Joaquin Music, ANP-BC, GNP-BC [name] Pager: [phone] 07/05/2011 10:53 AM  Dr. Delia Heady, Stroke Center Medical Director, has personally reviewed chart, pertinent data, examined the patient and developed the plan of care.

## 2011-07-05 NOTE — Progress Notes (Addendum)
Nutrition Follow-Up Note and Consult  Patient last seen by RD on 11/17. Pt extubated 11/17. BSE performed 11/17, recommending Dysphagia 1 diet with honey thickened liquids. Reassessed by SLP today, recommending Dysphagia 3 diet with thin liquids. Per RN, RD consulted regarding wife's concern with patients poor PO intake prior to advancement to Dys 3 diet with thin liquids today. Currently eating much better, around 90% of meals. Ate a [place] sandwich this morning, currently eating another sandwich during this RD's visit. Pt reports appetite is great. Wt at 84.7 kg. Stable. Pt reports follows a low sodium diet PTA.  Re-estimated needs: 1800-2000 kcal, 95 - 110 grams, 2 - 2.5 L/day  BMET    Component Value Date/Time   NA 139 07/05/2011 0610   K 3.7 07/05/2011 0610   CL 102 07/05/2011 0610   CO2 27 07/05/2011 0610   GLUCOSE 96 07/05/2011 0610   BUN 7 07/05/2011 0610   CREATININE 0.90 07/05/2011 0610   CALCIUM 9.1 07/05/2011 0610   GFRNONAA 88* 07/05/2011 0610   GFRAA >90 07/05/2011 0610   Lipid Panel     Component Value Date/Time   CHOL 163 07/05/2011 0610   TRIG 89 07/05/2011 0610   HDL 61 07/05/2011 0610   CHOLHDL 2.7 07/05/2011 0610   VLDL 18 07/05/2011 0610   LDLCALC 84 07/05/2011 0610   Nutrition dx: Inadequate oral intake - resolved Goal: Pt to consume >70% of meals and supplements. Met.  Plan: 1. Continue to encourage PO intake of meals and fluids as able. Pt has a great appetite at this time. RD to add Ensure Clinical Strength daily to better meet protein and kcal needs.  #[phone]

## 2011-07-05 NOTE — Consult Note (Signed)
Physical Medicine and Rehabilitation Consult Reason for Consult:stroke Referring Phsyician: triad [name] is an 65 y.o. male.   HPI: 65 year old African American male with history of seizure as well should vascular accident in the past. Admitted November 16 with seizure as well as altered mental status. He was intubated for airway protection. Noted history of noncompliance with seizure medications including Dilantin and phenobarbital. MRI of the brain acute infarction right frontal cortex over the convexity measuring approximately 15 mm. Placed on Plavix therapy as per neurology services.  Carotid Dopplers negative for internal carotid artery stenosis. Echocardiogram was pending. Patient was maintained on Breath, Dilantin and phenobarbital for seizure history. Critical care medicine followup this patient was slowly weaned from ventilator. Maintained on a mechanical soft diet with thin liquids as per speech therapy. Patient was requiring minimal assistance to ambulate 150 feet with noted poor attention. Occupational therapy assessment was pending. Patient was seen by physical medicine and rehabilitation at the request of physical therapy for consideration of inpatient rehabilitation services.  Review of Systems  Constitutional: Positive for malaise/fatigue.  Eyes: Negative for blurred vision.  Respiratory: Negative for cough and shortness of breath.   Cardiovascular: Negative for chest pain.  Gastrointestinal: Positive for constipation. Negative for nausea.  Genitourinary: Negative for frequency.  Musculoskeletal: Positive for myalgias.  Neurological: Positive for seizures. Negative for dizziness and headaches.  Psychiatric/Behavioral: Negative for depression.   Past Medical History  Diagnosis Date  . Hypertension   . Stroke     Multiple  . Seizures     partial and generalized, poor compliance   Past Surgical History  Procedure Date  . Coronary artery bypass graft    History  reviewed. No pertinent family history. Social History:  reports that he has quit smoking. His smoking use included Cigarettes. He has a 160 pack-year smoking history. He quit smokeless tobacco use about 7 years ago. He reports that he does not drink alcohol or use illicit drugs. Allergies: No Known Allergies Medications Prior to Admission  Medication Dose Route Frequency Provider Last Rate Last Dose  . 0.9 %  sodium chloride infusion  250 mL Intravenous PRN Juan Rojas 20 mL/hr at 07/03/11 1800 250 mL at 07/03/11 1800  . albuterol (PROVENTIL HFA;VENTOLIN HFA) 108 (90 BASE) MCG/ACT inhaler 4 puff  4 puff Inhalation Q2H PRN Orbie Hurst      . aspirin EC tablet 325 mg  325 mg Oral Daily Tara C Jernejcic, PA   325 mg at 07/05/11 1051  . chlorhexidine (PERIDEX) 0.12 % solution 15 mL  15 mL Mouth Rinse BID Juan Rojas   15 mL at 07/04/11 2020  . clopidogrel (PLAVIX) tablet 75 mg  75 mg Oral Daily Wesam Yacoub   75 mg at 07/05/11 1051  . diltiazem (CARDIZEM) 100 mg in dextrose 5 % 100 mL infusion  5 mg/hr Intravenous Once Donnetta Hutching, MD 5 mL/hr at 07/02/11 1941 5 mg/hr at 07/02/11 1941  . diltiazem (CARDIZEM) 100 MG injection           . diltiazem (CARDIZEM) tablet 30 mg  30 mg Oral Q6H Wesam Yacoub   30 mg at 07/05/11 0030  . etomidate (AMIDATE) 2 MG/ML injection           . etomidate (AMIDATE) injection 15 mg  15 mg Intravenous Once Donnetta Hutching, MD   15 mg at 07/02/11 1917  . fentaNYL (SUBLIMAZE) 10 mcg/mL in sodium chloride 0.9 % 250 mL infusion  25 mcg/hr Intravenous To Major  Donnetta Hutching, MD 2.5 mL/hr at 07/02/11 2023 25 mcg/hr at 07/02/11 2023  . folic acid (FOLVITE) tablet 1 mg  1 mg Oral Daily Wesam Yacoub   1 mg at 07/05/11 1050  . food thickener (RESOURCE THICKENUP CLEAR) powder 1 scoop  1 scoop Oral PRN Pola Corn, MD      . heparin injection 5,000 Units  5,000 Units Subcutaneous Q8H Juan Rojas   5,000 Units at 07/04/11 2157  . hydrochlorothiazide (HYDRODIURIL) tablet 25 mg  25 mg Oral Daily  Wesam Yacoub   25 mg at 07/05/11 1050  . levETIRAcetam (KEPPRA) tablet 500 mg  500 mg Oral Daily Wesam Yacoub   500 mg at 07/05/11 1049  . lidocaine (cardiac) 100 mg/14ml (XYLOCAINE) 20 MG/ML injection 2%           . lisinopril (PRINIVIL,ZESTRIL) tablet 40 mg  40 mg Oral Daily Wesam Yacoub   40 mg at 07/05/11 1051  . LORazepam (ATIVAN) injection 4 mg  4 mg Intravenous Once Donnetta Hutching, MD   4 mg at 07/02/11 1901  . magnesium sulfate IVPB 2 g 50 mL  2 g Intravenous Once Chinita Greenland, PHARMD   2 g at 07/03/11 1720  . midazolam (VERSED) 1 mg/mL in sodium chloride 0.9 % 50 mL infusion  2 mg/hr Intravenous To Major Donnetta Hutching, MD 2 mL/hr at 07/02/11 2024 2 mg/hr at 07/02/11 2024  . midazolam (VERSED) injection 4 mg  4 mg Intravenous Once Donnetta Hutching, MD      . midazolam (VERSED) injection 4 mg  4 mg Intravenous Once Donnetta Hutching, MD   4 mg at 07/02/11 2037  . NIFEdipine (PROCARDIA-XL/ADALAT CC) 24 hr tablet 60 mg  60 mg Oral Daily Wesam Yacoub   60 mg at 07/05/11 1049  . pantoprazole (PROTONIX) EC tablet 40 mg  40 mg Oral [address] [place]   40 mg at 07/04/11 1238  . PHENobarbital (LUMINAL) tablet 32.5 mg  32.5 mg Oral BID Chinita Greenland, PHARMD   32.5 mg at 07/04/11 2159  . phenytoin (DILANTIN) 1,000 mg in sodium chloride 0.9 % 250 mL IVPB  1,000 mg Intravenous To Major Juan Rojas   1,000 mg at 07/02/11 2309  . phenytoin (DILANTIN) ER capsule 100 mg  100 mg Oral TID Pola Corn, MD   100 mg at 07/05/11 1052  . phenytoin (DILANTIN) ER capsule 100 mg  100 mg Oral QID Wesam Yacoub   100 mg at 07/04/11 2158  . potassium chloride SA (K-DUR,KLOR-CON) CR tablet 40 mEq  40 mEq Oral Daily [address], [place]   40 mEq at 07/05/11 1048  . potassium phosphate 30 mmol in dextrose 5 % 500 mL infusion  30 mmol Intravenous Once Wesam Yacoub   30 mmol at 07/03/11 0943  . rocuronium (ZEMURON) 50 MG/5ML injection           . simvastatin (ZOCOR) tablet 40 mg  40 mg Oral q1800  Wesam Yacoub      . succinylcholine (ANECTINE) 20 MG/ML injection           . succinylcholine (ANECTINE) injection 125 mg  125 mg Intravenous Once Donnetta Hutching, MD   125 mg at 07/02/11 1917  . DISCONTD: antiseptic oral rinse (BIOTENE) solution 15 mL  15 mL Mouth Rinse QID Juan Rojas   15 mL at 07/04/11 1200  . DISCONTD: diltiazem (CARDIZEM) 10 mg/ml oral suspension 30 mg  30 mg Per Tube Q6H Wesam Yacoub      .  DISCONTD: etomidate (AMIDATE) 2 MG/ML injection           . DISCONTD: feeding supplement (JEVITY 1.2) liquid 1,000 mL  1,000 mL Per Tube Continuous Hettie Holstein, RD      . DISCONTD: feeding supplement (PRO-STAT SUGAR FREE 64) liquid 30 mL  30 mL Per Tube BID Hettie Holstein, RD      . DISCONTD: fentaNYL (SUBLIMAZE) 10 mcg/mL in sodium chloride 0.9 % 250 mL infusion  50-400 mcg/hr Intravenous Titrated Juan Rojas   50 mcg/hr at 07/03/11 0700  . DISCONTD: fentaNYL (SUBLIMAZE) bolus via infusion 50-100 mcg  50-100 mcg Intravenous Q6H PRN Orbie Hurst      . DISCONTD: fentaNYL (SUBLIMAZE) injection 50-100 mcg  50-100 mcg Intravenous Q2H PRN Koren Bound      . DISCONTD: lidocaine (cardiac) 100 mg/64ml (XYLOCAINE) 20 MG/ML injection 2%           . DISCONTD: magnesium bolus via infusion 2 g  2 g Intravenous Once [employer]      . DISCONTD: midazolam (VERSED) 1 mg/mL bolus via infusion 1-2 mg  1-2 mg Intravenous Q2H PRN Orbie Hurst      . DISCONTD: midazolam (VERSED) 1 mg/mL in sodium chloride 0.9 % 50 mL infusion  2-10 mg/hr Intravenous Titrated Juan Rojas   2 mg/hr at 07/03/11 0700  . DISCONTD: midazolam (VERSED) injection 2-4 mg  2-4 mg Intravenous Q2H PRN Koren Bound      . DISCONTD: pantoprazole (PROTONIX) injection 40 mg  40 mg Intravenous QHS Juan Rojas   40 mg at 07/02/11 2335  . DISCONTD: PHENObarbital (LUMINAL) tablet 30 mg  30 mg Oral BID Pola Corn, MD      . DISCONTD: phenytoin (DILANTIN) ER capsule 300 mg  300 mg Oral TID Pola Corn, MD      . DISCONTD:  potassium chloride 20 MEQ/15ML (10%) liquid 40 mEq  40 mEq Oral Daily Wesam Yacoub      . DISCONTD: rocuronium (ZEMURON) 50 MG/5ML injection           . DISCONTD: succinylcholine (ANECTINE) 20 MG/ML injection            Medications Prior to Admission  Medication Sig Dispense Refill  . clopidogrel (PLAVIX) 75 MG tablet Take 75 mg by mouth daily.        Marland Kitchen Fesoterodine Fumarate 8 MG TB24 Take 8 mg by mouth daily.        . folic acid (FOLVITE) 1 MG tablet Take 1 mg by mouth daily.        Marland Kitchen galantamine (RAZADYNE ER) 16 MG 24 hr capsule Take 16 mg by mouth daily with breakfast.        . hydrochlorothiazide (HYDRODIURIL) 25 MG tablet Take 25 mg by mouth daily.        Marland Kitchen levETIRAcetam (KEPPRA) 500 MG tablet Take 500 mg by mouth daily.        Marland Kitchen lisinopril (PRINIVIL,ZESTRIL) 40 MG tablet Take 40 mg by mouth daily.        Marland Kitchen NIFEdipine (PROCARDIA-XL/ADALAT CC) 60 MG 24 hr tablet Take 60 mg by mouth daily.        . phenytoin (DILANTIN) 100 MG ER capsule Take 100 mg by mouth 4 (four) times daily.        . pravastatin (PRAVACHOL) 80 MG tablet Take 40 mg by mouth daily.          Home: Home Living Lives With: Spouse Type of Home: House Home  Layout: One level Home Access: Stairs to enter Entrance Stairs-Rails: None Entrance Stairs-Number of Steps: 4 Bathroom Shower/Tub: Health visitor: Standard Bathroom Accessibility: Yes How Accessible: Accessible via walker Home Adaptive Equipment: Bedside commode/3-in-1;Walker - rolling;Wheelchair - powered;Wheelchair - Architectural technologist with back  Functional History: Prior Function Level of Independence: Independent with basic ADLs;Independent with gait Driving: Yes Functional Status:  Mobility: Bed Mobility Bed Mobility: Yes Supine to Sit: 4: Min assist Supine to Sit Details (indicate cue type and reason): Min -guard assist with increased time to complete task and use of rail to initiate trunk Sitting - Scoot to Edge of Bed: 4: Min  assist Sitting - Scoot to [employer] of Bed Details (indicate cue type and reason): Min-guard assist - verbal cues for scooting and sitting straight at edge of bed secondary to tendency to not scoot left side forward and have trunk rotation  Transfers Transfers: Yes Sit to Stand: 4: Min assist;From bed;From chair/3-in-1;With armrests;With upper extremity assist Sit to Stand Details (indicate cue type and reason): Patient with difficulty inititating and needs facilitation for sufficient anterior weight shift to prevent posterior lean. Cues for hand placement Stand to Sit: 4: Min assist;With armrests;To chair/3-in-1 Stand to Sit Details: Patient needs assistance for control of descent. Education on hand placement for safety Ambulation/Gait Ambulation/Gait: Yes Ambulation/Gait Assistance: 4: Min assist Ambulation/Gait Assistance Details (indicate cue type and reason): Patient needs min assistance secondary to poor attention in hallway and poor scanning of environment for obstacles despite cues to anticipate need to adjust rolling walker. Patient with tendency to kick rolling walker on left side secondary to walking too far to left side unless constant cues to correct - this increases with turning. Assistanc eis required for balance and safety. Ambulation Distance (Feet): 150 Feet Assistive device: Rolling walker Gait Pattern: Step-through pattern;Decreased step length - left;Decreased stance time - left;Trunk flexed Gait velocity: Patients speed of gait decreases with increased distance and increased distractions in hallway Stairs: No    ADL:    Cognition: Cognition Arousal/Alertness: Awake/alert Orientation Level: Oriented X4 Cognition Arousal/Alertness: Awake/alert Overall Cognitive Status:  (At times, slow to process information) Orientation Level: Oriented X4  Blood pressure 100/66, pulse 64, temperature 97.9 F (36.6 C), temperature source Oral, resp. rate 16, height 5\' 11"  (1.803 m),  weight 84.7 kg (186 lb 11.7 oz), SpO2 97.00%. Physical Exam  Constitutional: He appears well-developed.  HENT:  Head: Normocephalic.  Eyes: Pupils are equal, round, and reactive to light.  Neck: Normal range of motion. Neck supple. No thyromegaly present.  Cardiovascular: Regular rhythm.   Pulmonary/Chest: No respiratory distress. He has no wheezes.  Abdominal: Soft. He exhibits no distension.  Musculoskeletal: He exhibits no edema.  Neurological: He is alert.       Needs cues for situation and needs cues to attend to task. Oriented to person but not date.  Psychiatric:       Poor judgement and limited safety awareness.    Results for orders placed during the hospital encounter of 07/02/11 (from the past 24 hour(s))  CBC     Status: Abnormal   Collection Time   07/05/11  6:10 AM      Component Value Range   WBC 9.4  4.0 - 10.5 (K/uL)   RBC 4.07 (*) 4.22 - 5.81 (MIL/uL)   Hemoglobin 12.5 (*) 13.0 - 17.0 (g/dL)   HCT 29.5 (*) 62.1 - 52.0 (%)   MCV 90.2  78.0 - 100.0 (fL)   MCH 30.7  26.0 -  34.0 (pg)   MCHC 34.1  30.0 - 36.0 (g/dL)   RDW 29.5  28.4 - 13.2 (%)   Platelets 182  150 - 400 (K/uL)  BASIC METABOLIC PANEL     Status: Abnormal   Collection Time   07/05/11  6:10 AM      Component Value Range   Sodium 139  135 - 145 (mEq/L)   Potassium 3.7  3.5 - 5.1 (mEq/L)   Chloride 102  96 - 112 (mEq/L)   CO2 27  19 - 32 (mEq/L)   Glucose, Bld 96  70 - 99 (mg/dL)   BUN 7  6 - 23 (mg/dL)   Creatinine, Ser 4.40  0.50 - 1.35 (mg/dL)   Calcium 9.1  8.4 - 10.2 (mg/dL)   GFR calc non Af Amer 88 (*) >90 (mL/min)   GFR calc Af Amer >90  >90 (mL/min)  PHOSPHORUS     Status: Normal   Collection Time   07/05/11  6:10 AM      Component Value Range   Phosphorus 2.6  2.3 - 4.6 (mg/dL)  MAGNESIUM     Status: Normal   Collection Time   07/05/11  6:10 AM      Component Value Range   Magnesium 1.9  1.5 - 2.5 (mg/dL)  LIPID PANEL     Status: Normal   Collection Time   07/05/11  6:10 AM       Component Value Range   Cholesterol 163  0 - 200 (mg/dL)   Triglycerides 89  <725 (mg/dL)   HDL 61  >36 (mg/dL)   Total CHOL/HDL Ratio 2.7     VLDL 18  0 - 40 (mg/dL)   LDL Cholesterol 84  0 - 99 (mg/dL)   Mr Brain Wo Contrast  07/03/2011  *RADIOLOGY REPORT*  Clinical Data: Left-sided weakness  MRI HEAD WITHOUT CONTRAST  Technique:  Multiplanar, multiecho pulse sequences of the brain and surrounding structures were obtained according to standard protocol without intravenous contrast.  Comparison: CT 07/02/2011  Findings: Acute infarct in the right frontal cortex over the convexity.  Advanced atrophy for age.  Chronic ischemic changes throughout the cerebral white matter bilaterally.  Mild chronic ischemia in the pons.  Perivascular cysts are noted in the basal ganglia bilaterally.  Chronic infarct left external capsule posteriorly.  Negative for hemorrhage or mass lesion.  IMPRESSION: Acute infarct right frontal cortex over the convexity measuring approximately 15 mm.  Advanced atrophy.  Chronic microvascular ischemia.  Original Report Authenticated By: Camelia Phenes, M.D.   Dg Chest Port 1 View  07/04/2011  *RADIOLOGY REPORT*  Clinical Data: Intubated  PORTABLE CHEST - 1 VIEW  Comparison: 07/02/2011  Findings: The patient has been extubated.  Previous CABG.  Minimal subsegmental atelectasis persists at the left lung base. Right lung clear.  Heart size normal.  No effusion.  IMPRESSION:  1.  Extubation with minimal subsegmental atelectasis at the left lung base.  Original Report Authenticated By: Osa Craver, M.D.    Assessment/Plan: Diagnosis: Seizure, Deconditioning 1. Does the need for close, 24 hr/day medical supervision in concert with the patient's rehab needs make it unreasonable for this patient to be served in a less intensive setting? No 2. Co-Morbidities requiring supervision/potential complications: hx of cva 3. Due to bladder management and safety, does the patient  require 24 hr/day rehab nursing? No 4. Does the patient require coordinated care of a physician, rehab nurse, PT, OT to address physical and functional deficits in  the context of the above medical diagnosis(es)? No Addressing deficits in the following areas: balance 5. Can the patient actively participate in an intensive therapy program of at least 3 hrs of therapy per day at least 5 days per week? Yes 6. The potential for patient to make measurable gains while on inpatient rehab is fair 7. Anticipated functional outcomes upon discharge from inpatients are (not applicable) 8. Estimated rehab length of stay to reach the above functional goals is: not applicable 9. Does the patient have adequate social supports to accommodate these discharge functional goals? Yes 10. Anticipated D/C setting: Home 11. Anticipated post D/C treatments: HH therapy 12. Overall Rehab/Functional Prognosis: excellent  RECOMMENDATIONS: This patient's condition is appropriate for continued rehabilitative care in the following setting: HH vs SNF Patient has agreed to participate in recommended program. Yes and Potentially Note that insurance prior authorization may be required for reimbursement for recommended care.  Comment: I suspect he's not too far from baseline.  Probably could go home.  If he and wife think they can't manage then you have to look at [facility]    07/05/2011

## 2011-07-05 NOTE — Progress Notes (Signed)
Physical Therapy Treatment Patient Details Name: [name] MRN: [id number] DOB: [birth year] Today's Date: 07/05/2011  PT Assessment/Plan  PT - Assessment/Plan Comments on Treatment Session: Patient with increased functional activity tolerance is at significant risk of fall PT Plan: Discharge plan remains appropriate Follow Up Recommendations: Inpatient Rehab Equipment Recommended: Defer to next venue PT Goals  Acute Rehab PT Goals PT Goal: Supine/Side to Sit - Progress: Progressing toward goal PT Transfer Goal: Sit to Stand/Stand to Sit - Progress: Progressing toward goal PT Goal: Ambulate - Progress: Progressing toward goal  PT Treatment Precautions/Restrictions  Precautions Precautions: Fall Precaution Comments: Decreased balance with gait - fall risk Required Braces or Orthoses: No Restrictions Weight Bearing Restrictions: No Mobility (including Balance) Bed Mobility Bed Mobility: Yes Supine to Sit: 4: Min assist Supine to Sit Details (indicate cue type and reason): Min -guard assist with increased time to complete task and use of rail to initiate trunk Sitting - Scoot to Edge of Bed: 4: Min assist Sitting - Scoot to [employer] of Bed Details (indicate cue type and reason): Min-guard assist - verbal cues for scooting and sitting straight at edge of bed secondary to tendency to not scoot left side forward and have trunk rotation  Transfers Sit to Stand: 4: Min assist;From bed;From chair/3-in-1;With armrests;With upper extremity assist Sit to Stand Details (indicate cue type and reason): Patient with difficulty inititating and needs facilitation for sufficient anterior weight shift to prevent posterior lean. Cues for hand placement Stand to Sit: 4: Min assist;With armrests;To chair/3-in-1 Stand to Sit Details: Patient needs assistance for control of descent. Education on hand placement for safety Ambulation/Gait Ambulation/Gait Assistance: 4: Min assist Ambulation/Gait  Assistance Details (indicate cue type and reason): Patient needs min assistance secondary to poor attention in hallway and poor scanning of environment for obstacles despite cues to anticipate need to adjust rolling walker. Patient with tendency to kick rolling walker on left side secondary to walking too far to left side unless constant cues to correct - this increases with turning. Assistance is required for balance and safety. Ambulation Distance (Feet): 150 Feet Assistive device: Rolling walker Gait velocity: Patients speed of gait decreases with increased distance and increased distractions in hallway  Posture/Postural Control Posture/Postural Control: Postural limitations Postural Limitations: Unable to stand with eyes closed without minimal assistance for corrections. Immediate loss of balance with placing feet in romberg position. End of Session PT - End of Session Equipment Utilized During Treatment: Gait belt Activity Tolerance: Patient tolerated treatment well Patient left: in chair Nurse Communication: Mobility status for ambulation General Behavior During Session: [facility] for tasks performed Cognition: Impaired Cognitive Impairment: Siginficant deficits with problem solving, awareness, and attention in functrional task.  Edwyna Perfect, PT  Pager [phone]  07/05/2011, 10:28 AM

## 2011-07-05 NOTE — Progress Notes (Signed)
Speech Language/Pathology Speech Language Pathology Treatment Patient Details Name: [name] MRN: [id number] DOB: 01-18-1946 Today's Date: 07/05/2011  SLP Assessment/Plan/Recommendation Assessment Clinical Impression Statement:  Clinical reassessment of swallow function completed at bedside.  Much improved function as compared to previous assessment.  No overt s/s of aspiration observed with thin liquids, even with straw, or with mechanical soft foods.  Patient was able to chew graham crackers and bites of [place] sandwich without difficulty.  Patient's lung sounds are clear, per RN documentation, CXR is clear, and patient is afebrile.  Plan:  Advance to Dysphagia 3 diet with thin liquids.  Meds whole in applesauce. Speech Therapy Frequency: 1-2 times for assessment of diet tolerance with advanced diet.  SLP Goals   Patient will tolerate Dys. 3 diet with thin liquids without s/s of aspiration, with min. Assist.     Maryjo Rochester T 07/05/2011, 10:06 AM

## 2011-07-06 LAB — COMPREHENSIVE METABOLIC PANEL
ALT: 8 U/L (ref 0–53)
AST: 11 U/L (ref 0–37)
Albumin: 3.2 g/dL — ABNORMAL LOW (ref 3.5–5.2)
Alkaline Phosphatase: 118 U/L — ABNORMAL HIGH (ref 39–117)
Alkaline Phosphatase: 125 U/L — ABNORMAL HIGH (ref 39–117)
BUN: 7 mg/dL (ref 6–23)
CO2: 24 mEq/L (ref 19–32)
Calcium: 8.8 mg/dL (ref 8.4–10.5)
Calcium: 8.9 mg/dL (ref 8.4–10.5)
Chloride: 101 mEq/L (ref 96–112)
GFR calc Af Amer: >90 mL/min (ref >90)
GFR calc non Af Amer: 90 mL/min — ABNORMAL LOW (ref >90)
Glucose, Bld: 149 mg/dL — ABNORMAL HIGH (ref 70–99)
Potassium: 3.4 mEq/L — ABNORMAL LOW (ref 3.5–5.1)
Potassium: 3.7 mEq/L (ref 3.5–5.1)
Sodium: 135 mEq/L (ref 135–145)
Total Bilirubin: 0.2 mg/dL — ABNORMAL LOW (ref 0.3–1.2)
Total Protein: 6.9 g/dL (ref 6.0–8.3)

## 2011-07-06 LAB — CBC
HCT: 35.3 % — ABNORMAL LOW (ref 39.0–52.0)
MCH: 30.1 pg (ref 26.0–34.0)
MCHC: 33.4 g/dL (ref 30.0–36.0)
RDW: 14.1 % (ref 11.5–15.5)

## 2011-07-06 MED ORDER — ASPIRIN-DIPYRIDAMOLE ER 25-200 MG PO CP12
1.0000 | ORAL_CAPSULE | Freq: Every day | ORAL | Status: DC
  Administered 2011-07-06: 1 via ORAL
  Filled 2011-07-06 (×2): qty 1

## 2011-07-06 MED ORDER — ASPIRIN EC 81 MG PO TBEC
81.0000 mg | DELAYED_RELEASE_TABLET | Freq: Every day | ORAL | Status: DC
  Administered 2011-07-06 – 2011-07-07 (×2): 81 mg via ORAL
  Filled 2011-07-06 (×2): qty 1

## 2011-07-06 MED ORDER — ASPIRIN-DIPYRIDAMOLE ER 25-200 MG PO CP12: 1.0000 | ORAL_CAPSULE | Freq: Two times a day (BID) | ORAL | Status: DC

## 2011-07-06 NOTE — Progress Notes (Signed)
*  PRELIMINARY RESULTS* TCD completed  [place], [place] D 07/06/2011, 3:11 PM

## 2011-07-06 NOTE — Progress Notes (Signed)
CSW spoke with wife and provided bed offers. Wife stated she was interested in patient returning to [facility] since he had been there in the past. CSW contacted SNF who can offer a bed and is agreeable to admission when patient is discharged. CSW will continue to follow. Unk Lightning [phone]

## 2011-07-06 NOTE — Clinical Documentation Improvement (Signed)
GENERIC DOCUMENTATION CLARIFICATION QUERY  Please update your documentation within the medical record to reflect your response to this query.                                                                                         07/06/11    Dear Dr.Kimberlye Dilger / Associates,  In a better effort to capture your patient's severity of illness, reflect appropriate length of stay and utilization of resources, a review of the patient medical record has revealed the following indicators.   Based on your clinical judgment, please clarify and document in a progress note and/or discharge summary the clinical condition associated with the following supporting information: In responding to this query please exercise your independent judgment.  The fact that a query is asked, does not imply that any particular answer is desired or expected.  TO RESPOND TO THE THIS QUERY, FOLLOW THE INSTRUCTIONS BELOW:  1. If needed, update documentation for the patient's encounter via the notes activity.  2. Access this query again and click edit on the [employer].  3. After updating, or not, click F2 to complete all highlighted (required) fields concerning your review. Select "additional documentation in the medical record" OR "no additional documentation provided". 4. Click Sign note button.  5. The deficiency will fall out of your InBasket  *Please let us know if you are not able to compete this workflow by phone or e-mail (listed below).   Please consider the below (if your clinical findings/judgment agree) as you document the patient's condition(s) in the progress note and discharge summary. Thank you!  Possible Clinical Conditions?  - LT hemiparesis  - Other condition (please document in the progress notes and/or discharge summary)  - Cannot Clinically determine at this time   Supporting Information:  - "today more L sided weakness than baseline due to previous strokes" written in Dr. Percival Spanish  progress note 11/18  - History of stroke (H&P)   Reviewed:  no additional documentation provided  Thank You,  Sincerely, Eldred Manges  Clinical Documentation Specialist Health Information Management [place] Email: [email or web address]    Patient was seen by me post extubation.It is possible he had metabolic encephalopathy prior to intubation

## 2011-07-06 NOTE — Progress Notes (Signed)
Patient does not need admit to inpatient acute rehabilitation at this time medically. Recommend home with home health and 24/7 care of wife vs SNF rehab if felt not able to manage at home at current functional level. Please call with any questions. Pager [phone].

## 2011-07-06 NOTE — Progress Notes (Signed)
Patient ID: [name], male   DOB: May 13, 1946, 65 y.o.   MRN: [id number]  Subjective: Patient seen.Feels better.No complaints  Objective: Weight change:   Intake/Output Summary (Last 24 hours) at 07/05/11 0905 Last data filed at 07/04/11 1100  Gross per 24 hour  Intake    120 ml  Output      0 ml  Net    120 ml   BP 116/76  Pulse 74  Temp(Src) 98.6 F (37 C) (Oral)  Resp 19  Ht 5\' 11"  (1.803 m)  Wt 83.4 kg (183 lb 13.8 oz)  BMI 25.64 kg/m2  SpO2 98% Physical Exam: General appearance: alert, cooperative and no distress Head: Normocephalic, without obvious abnormality, atraumatic Neck: no adenopathy, no carotid bruit, no JVD, supple, symmetrical, trachea midline and thyroid not enlarged, symmetric, no tenderness/mass/nodules Lungs: clear to auscultation bilaterally Heart: regular rate and rhythm, S1, S2 normal, no murmur, click, rub or gallop Abdomen: soft, non-tender; bowel sounds normal; no masses,  no organomegaly Extremities: extremities normal, atraumatic, no cyanosis or edema Skin: Skin color, texture, turgor normal. No rashes or lesions Neuro-non focal  Lab Results: @labtest @  Micro Results: Recent Results (from the past 240 hour(s))  MRSA PCR SCREENING     Status: Normal   Collection Time   07/02/11 10:23 PM      Component Value Range Status Comment   MRSA by PCR NEGATIVE  NEGATIVE  Final     Studies/Results: Ct Head Wo Contrast  07/02/2011  *RADIOLOGY REPORT*  Clinical Data: Left-sided weakness, aphasia  CT HEAD WITHOUT CONTRAST  Technique:  Contiguous axial images were obtained from the base of the skull through the vertex without contrast.  Comparison: MRI brain dated 07/22/2010  Findings: Hypodensity in the right parietal region (series 2/image 22), possibly reflecting an age indeterminate infarct, new from prior MRI.  No evidence of parenchymal hemorrhage or extra-axial fluid collection.  No mass lesion, mass effect, or midline shift.  Extensive  subcortical white matter and periventricular small vessel ischemic changes.  Intracranial atherosclerosis.  Global cortical atrophy with secondary ventriculomegaly.  The visualized paranasal sinuses are essentially clear. The mastoid air cells are unopacified.  No evidence of calvarial fracture.  IMPRESSION: Hypodensity in the right parietal region, possibly reflecting an age indeterminate infarct.  Extensive small vessel ischemic changes with atrophy and intracranial atherosclerosis.  Original Report Authenticated By: Charline Bills, M.D.   Mr Brain Wo Contrast  07/03/2011  *RADIOLOGY REPORT*  Clinical Data: Left-sided weakness  MRI HEAD WITHOUT CONTRAST  Technique:  Multiplanar, multiecho pulse sequences of the brain and surrounding structures were obtained according to standard protocol without intravenous contrast.  Comparison: CT 07/02/2011  Findings: Acute infarct in the right frontal cortex over the convexity.  Advanced atrophy for age.  Chronic ischemic changes throughout the cerebral white matter bilaterally.  Mild chronic ischemia in the pons.  Perivascular cysts are noted in the basal ganglia bilaterally.  Chronic infarct left external capsule posteriorly.  Negative for hemorrhage or mass lesion.  IMPRESSION: Acute infarct right frontal cortex over the convexity measuring approximately 15 mm.  Advanced atrophy.  Chronic microvascular ischemia.  Original Report Authenticated By: Camelia Phenes, M.D.   Dg Chest Port 1 View  07/04/2011  *RADIOLOGY REPORT*  Clinical Data: Intubated  PORTABLE CHEST - 1 VIEW  Comparison: 07/02/2011  Findings: The patient has been extubated.  Previous CABG.  Minimal subsegmental atelectasis persists at the left lung base. Right lung clear.  Heart size normal.  No effusion.  IMPRESSION:  1.  Extubation with minimal subsegmental atelectasis at the left lung base.  Original Report Authenticated By: Osa Craver, M.D.   Dg Chest Port 1 View  07/02/2011   *RADIOLOGY REPORT*  Clinical Data: Post intubation  PORTABLE CHEST - 1 VIEW  Comparison: 08/19/2005  Findings: Endotracheal tube terminates 5 cm above the carina.  The lungs are essentially clear. No pleural effusion or pneumothorax.  Mild cardiomegaly. Postsurgical changes related to prior CABG.  IMPRESSION: Endotracheal tube terminates 5 cm above the carina.  Original Report Authenticated By: Charline Bills, M.D.   Medications: Scheduled Meds:   . aspirin EC  325 mg Oral Daily  . chlorhexidine  15 mL Mouth Rinse BID  . clopidogrel  75 mg Oral Daily  . diltiazem  30 mg Oral Q6H  . folic acid  1 mg Oral Daily  . heparin  5,000 Units Subcutaneous Q8H  . hydrochlorothiazide  25 mg Oral Daily  . levETIRAcetam  500 mg Oral Daily  . lisinopril  40 mg Oral Daily  . NIFEdipine  60 mg Oral Daily  . pantoprazole  40 mg Oral Q1200  . PHENObarbital  32.5 mg Oral BID  . phenytoin  100 mg Oral TID  . phenytoin  100 mg Oral QID  . potassium chloride  40 mEq Oral Daily  . simvastatin  40 mg Oral q1800  . DISCONTD: antiseptic oral rinse  15 mL Mouth Rinse QID  . 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 #6 Dementia #7 Deconditioning-will benefit from rehab  Awaitng placement  LOS: 4 days   Noura Purpura 07/06/2011, 12:48 PM

## 2011-07-06 NOTE — Clinical Documentation Improvement (Signed)
CHANGE MENTAL STATUS DOCUMENTATION CLARIFICATION   Dear Dr. Carleene Cooper  In an effort to better capture your patient's severity of illness, reflect appropriate length of stay and utilization of resources, a review of the patient medical record has revealed the following indicators.   Based on your clinical judgment, please clarify and document in a progress note and/or discharge summary the clinical condition associated with the following supporting information: In responding to this query please exercise your independent judgment.  The fact that a query is asked, does not imply that any particular answer is desired or expected.  TO RESPOND TO THE THIS QUERY, FOLLOW THE INSTRUCTIONS BELOW:  1. If needed, update documentation for the patient's encounter via the notes activity.  2. Access this query again and click edit on the [employer].  3. After updating, or not, click F2 to complete all highlighted (required) fields concerning your review. Select "additional documentation in the medical record" OR "no additional documentation provided". 4. Click Sign note button.  5. The deficiency will fall out of your InBasket  *Please let us know if you are not able to compete this workflow by phone or e-mail (listed below).  "Acute Encephalopathy" was documented by Lajuana Carry, MD in his consult on 11/16. Please consider the below (if your clinical findings/judgment agree) as you document the patient's condition(s) in the progress note and discharge summary. Thank you!  Possible Clinical Conditions?  - Acute _________ Encephalopathy (describe type if known)                      ___ Anoxic                      ___ Metabolic                     - Other condition (please document in the progress notes and/or discharge summary)  - Cannot Clinically determine at this time    Supporting Information:  - Current Acute diagnosis: Stroke, seizure, Acute respiratory failure, a fib with  RVR.  - Patient Intubated/ventilator in ED per records.  - "admitted to [facility] due to altered mental status" H&P  - "Patient woke up at 1200PM today and is confused, weak, not following commands. (per wife) Patient seems confused. Patient is normally alert and orientedx4 per wife" nursing note 11/16     Thank You,  Sincerely, Eldred Manges  Clinical Documentation Specialist Health Information Management  Email: [email or web address]  stage3/4

## 2011-07-06 NOTE — Progress Notes (Signed)
Physical Therapy Treatment Patient Details Name: [name] MRN: [id number] DOB: [birth year] Today's Date: 07/06/2011  PT Assessment/Plan  PT - Assessment/Plan Comments on Treatment Session: The patient is progressing well with increased gait distance and decreased assistance with mobility in genteral, however, he needs constant cues to be safe walking with the RW.  He is easily distracted in busy hallway environment and he doesn't seem to retain, or carryover education from previous treatment sessions.   PT Plan: Discharge plan needs to be updated PT Frequency: Min 4X/week Follow Up Recommendations: Skilled nursing facility Equipment Recommended: Defer to next venue PT Goals  Acute Rehab PT Goals PT Goal: Supine/Side to Sit - Progress: Progressing toward goal PT Goal: Sit to Supine/Side - Progress: Progressing toward goal PT Transfer Goal: Sit to Stand/Stand to Sit - Progress: Progressing toward goal PT Goal: Ambulate - Progress: Progressing toward goal  PT Treatment Precautions/Restrictions  Precautions Precautions: Fall Precaution Comments: Decreased balance with gait - fall risk Required Braces or Orthoses: No Restrictions Weight Bearing Restrictions: No Mobility (including Balance) Bed Mobility Supine to Sit: 6: Modified independent (Device/Increase time);With rails;HOB elevated (Comment degrees) (HOB 20 degrees) Supine to Sit Details (indicate cue type and reason): extra time needed to get to EOB, but patient able to do it with the help of bedrail and elevated HOB.   Sitting - Scoot to Edge of Bed: 6: Modified independent (Device/Increase time);With rail Sitting - Scoot to Edge of Bed Details (indicate cue type and reason): patient also able to get to EOB by pulling on foot of bed.   Transfers Sit to Stand: 4: Min assist;With upper extremity assist;From bed Sit to Stand Details (indicate cue type and reason): attempted without help x2, but unsuccessful.  Patient pulling on  RW and trying to use momentum.  Min assist to finally successfully stand by supporting trunk.   Stand to Sit: 5: Supervision Stand to Sit Details: supervision for safety, verbal cues for safe technique.   Ambulation/Gait Ambulation/Gait Assistance: 4: Min assist Ambulation/Gait Assistance Details (indicate cue type and reason): min assist to steady patient for balance, usually when he kicks the left side of the RW with his left foot or while he is walking and looking at something or while turning.   Ambulation Distance (Feet): 280 Feet Assistive device: Rolling walker Gait Pattern: Step-through pattern;Trunk flexed (decreased foot clearance on the left.)    Exercise  General Exercises - Lower Extremity Long Arc Quad: AROM;Both;5 reps Hip Flexion/Marching: AROM;Both;5 reps Heel Raises: AROM;Both;10 reps Mini-Sqauts: AROM;Both;10 reps End of Session PT - End of Session Equipment Utilized During Treatment: Gait belt (RW) Activity Tolerance: Patient tolerated treatment well Patient left: in bed;with call bell in reach;with family/visitor present ("friends" per patient) General Behavior During Session: [facility] for tasks performed Cognition: Impaired Cognitive Impairment: Decreased awarness of deficits, doesn't process or follow commands, difficulty attending to task in busy environment.  Easily distracted.  Questionable carryover of teaching from session to session.    Rollene Rotunda Taquanna Borras, PT, DPT [phone]   07/06/2011, 4:22 PM

## 2011-07-06 NOTE — Plan of Care (Signed)
Problem: Phase II Progression Outcomes Goal: Other Phase II Outcomes/Goals Speech-cognition-language assessment completed.  Will follow while in acute care  [name] M.Ed [employer] [phone] 07/06/2011

## 2011-07-06 NOTE — Progress Notes (Signed)
Stroke Team Progress Note  SUBJECTIVE Family member at bedside. MD discussed prognosis, cause of stroke and need for rehab.patient leaving to  have dopplers done  OBJECTIVE Most recent Vital Signs: Temp: 98.6 F (37 C) (11/20 0600) Temp src: Oral (11/20 0600) BP: 114/82 mmHg (11/20 0600) Pulse Rate: 75  (11/20 0600) Respiratory Rate: 19 O2 Saturdation: 98%  CBG (last 3)  No results found for this basename: GLUCAP:3 in the last 72 hours Intake/Output from previous day: 11/19 0701 - 11/20 0700 In: -  Out: 250 [Urine:250]  IV Fluid Intake:    Diet:  Dysphagia 3 thin liquids  Activity:  Up with assistance  DVT Prophylaxis:  Heparin 5000 units sq tid  Studies: CBC    Component Value Date/Time   WBC 7.8 07/06/2011 0512   RBC 3.92* 07/06/2011 0512   HGB 11.8* 07/06/2011 0512   HCT 35.3* 07/06/2011 0512   PLT 194 07/06/2011 0512   MCV 90.1 07/06/2011 0512   MCH 30.1 07/06/2011 0512   MCHC 33.4 07/06/2011 0512   RDW 14.1 07/06/2011 0512   LYMPHSABS 1.5 07/02/2011 1629   MONOABS 0.5 07/02/2011 1629   EOSABS 0.1 07/02/2011 1629   BASOSABS 0.0 07/02/2011 1629   CMP    Component Value Date/Time   NA 136 07/06/2011 0512   K 3.4* 07/06/2011 0512   CL 100 07/06/2011 0512   CO2 23 07/06/2011 0512   GLUCOSE 96 07/06/2011 0512   BUN 7 07/06/2011 0512   CREATININE 0.85 07/06/2011 0512   CALCIUM 8.8 07/06/2011 0512   PROT 6.9 07/06/2011 0512   ALBUMIN 3.2* 07/06/2011 0512   AST 13 07/06/2011 0512   ALT 8 07/06/2011 0512   ALKPHOS 118* 07/06/2011 0512   BILITOT 0.3 07/06/2011 0512   GFRNONAA >90 07/06/2011 0512   GFRAA >90 07/06/2011 0512   COAGS No results found for this basename: INR, PROTIME   Lipid Panel    Component Value Date/Time   CHOL 163 07/05/2011 0610   TRIG 89 07/05/2011 0610   HDL 61 07/05/2011 0610   CHOLHDL 2.7 07/05/2011 0610   VLDL 18 07/05/2011 0610   LDLCALC 84 07/05/2011 0610   HgbA1C  Lab Results  Component Value Date   HGBA1C 6.0*  07/05/2011   Urine Drug Screen  No results found for this basename: labopia, cocainscrnur, labbenz, amphetmu, thcu, labbarb    Alcohol Level No results found for this basename: eth   TCD:  Test ordered, to be done   Physical Exam:  Pleasant middle-aged African American gentleman not in distress. Neck is supple without bruit. Cardiac exam no murmur or gallop. Lungs are clear to auscultation.  Neurological exam awake alert oriented to time place and person. Follow simple commands well. Diminished attention, short-term memory and recall. No aphasia, dysarthria or apraxia. Eye movements are full range without nystagmus. Face is symmetric without weakness. Motor system exam reveals no upper or lower extremity drift. No focal weakness. Mild diminished fine finger movements on the left hand. Orbits the right and her left upper extremity. Mild truncal ataxia. Gait deferred.  ASSESSMENT [name] is a 65 y.o. male with a acute infarct right frontal cortex secondary to small vessel disease. On ASA and plavix currently. On plavix prior to admission.  Seizures  Stroke risk factors:  hypertension and previous stroke  Hospital day # 4  TREATMENT/PLAN Stop Plavix. Add Aggrenox for secondary stroke prevention. Will give ASA 81 x 2 wks q am and Aggrenox q pm. Increase Aggrenox  to bid in 2 wks and stop  ASA at that time. Follow up TCD. Will sign-off. Discussed with patient and family. Follow up with Dr. Pearlean Brownie in 2 months.  [name], ANP-BC, GNP-BC [facility] Pager: [phone] 07/06/2011 8:00 AM  Dr. Delia Heady, Stroke Center Medical Director, has personally reviewed chart, pertinent data, examined the patient and developed the plan of care.

## 2011-07-06 NOTE — Progress Notes (Signed)
Speech Language/Pathology Speech Language Pathology Evaluation Patient Details Name: [name] MRN: [id number] DOB: 11/08/1945 Today's Date: 07/06/2011  Problem List:  Patient Active Problem List  Diagnoses  . Seizure  . Stroke  . Tachycardia  . Dementia  . Acute respiratory failure    Past Medical History:  Past Medical History  Diagnosis Date  . Hypertension   . Stroke     Multiple  . Seizures     partial and generalized, poor compliance   Past Surgical History:  Past Surgical History  Procedure Date  . Coronary artery bypass graft     SLP Assessment/Plan/Recommendation Assessment Clinical Impression Statement: Pt. demonstrated moderate cognitive deficits in the areas of problem solving, thought processing, awareness, and working memory.  Pt. has premorbid cognitive deficits from numerous prior strokes with mild exacerbation with current CVA (per wife).  Speech intelligibility and language are WFL's.  He exhibits inconsistent and  minimal verbal hesitancies with initial sounds.  Speech therapy warranted while pt. in hospital with follow up at [facility]. SLP Recommendation/Assessment: Patient will need skilled Speech Lanaguage Pathology Services in the acute care venue to address identified deficits Problem List: Memory;Problem Solving;Thought organization;Reasoning;Other (comment) (awareness) Therapy Diagnosis: Cognitive Impairments Plan Speech Therapy Frequency: min 2x/week Duration: 2 weeks Treatment/Interventions: Environmental controls;Cognitive reorganization;Internal/external aids;SLP instruction and feedback;Compensatory strategies;Patient/family education Potential to Achieve Goals: Good Recommendation Follow up Recommendations: Skilled Nursing facility Equipment Recommended: Defer to next venue Individuals Consulted Consulted and Agree with Results and Recommendations: Patient;Family member/caregiver  SLP Goals   see assessment for goals  SLP  Evaluation Prior Functioning  Prior Functional Status Cognitive/Linguistic Baseline: Baseline deficits Baseline deficit details: Wife reports this is pt.'s 7th stroke.  Premorbid difficulty with memory, problem solving Type of Home: House Lives With: Spouse Receives Help From: Family Vocation: Retired Financial risk analyst Overall Cognitive Status: Impaired Arousal/Alertness: Awake/alert Orientation Level: Oriented to person;Oriented to situation;Oriented to time;Oriented to place Attention: Focused;Sustained Focused Attention: Appears intact Sustained Attention: Appears intact Memory: Impaired Memory Impairment: Decreased short term memory;Decreased recall of new information Decreased Short Term Memory: Verbal basic Awareness: Impaired Awareness Impairment: Intellectual impairment;Emergent impairment;Anticipatory impairment Problem Solving: Impaired Problem Solving Impairment: Verbal basic;Functional basic Executive Function: Self Monitoring Self Monitoring: Impaired Self Monitoring Impairment: Verbal basic;Functional basic Safety/Judgment: Impaired Comprehension  Auditory Comprehension Yes/No Questions: Within Functional Limits Commands: Impaired Two Step Basic Commands: 25-49% accurate Multistep Basic Commands: 25-49% accurate Conversation: Simple Interfering Components: Processing speed;Working Radio broadcast assistant: Dietitian: Not tested Reading Comprehension Reading Status: Not tested Expression  Expression Primary Mode of Expression: Verbal Verbal Expression Initiation: No impairment Level of Generative/Spontaneous Verbalization: Sentence Repetition: No impairment Naming: No impairment Pragmatics: No impairment Other Verbal Expression Comments: Occassional minimal hesitations with initial phoneme Written Expression Written Expression: Not tested Oral/Motor  Oral Motor/Sensory  Function Labial ROM: Within Functional Limits Labial Symmetry: Within Functional Limits Labial Strength: Within Functional Limits Labial Sensation: Reduced Lingual ROM: Within Functional Limits Lingual Symmetry: Within Functional Limits Lingual Strength: Within Functional Limits Lingual Sensation: Within Functional Limits Facial ROM: Within Functional Limits Facial Symmetry: Within Functional Limits Facial Strength: Within Functional Limits Facial Sensation: Reduced Velum: Within Functional Limits Mandible: Within Functional Limits Motor Speech Intelligibility: Intelligible  [name].Ed [employer] [phone] 07/06/2011

## 2011-07-07 DIAGNOSIS — I4891 Unspecified atrial fibrillation: Secondary | ICD-10-CM

## 2011-07-07 DIAGNOSIS — E785 Hyperlipidemia, unspecified: Secondary | ICD-10-CM | POA: Diagnosis present

## 2011-07-07 DIAGNOSIS — I10 Essential (primary) hypertension: Secondary | ICD-10-CM | POA: Diagnosis present

## 2011-07-07 LAB — CBC
MCH: 30.6 pg (ref 26.0–34.0)
MCHC: 34.2 g/dL (ref 30.0–36.0)
Platelets: 218 10*3/uL (ref 150–400)
RDW: 14 % (ref 11.5–15.5)

## 2011-07-07 LAB — LEVETIRACETAM LEVEL

## 2011-07-07 MED ORDER — PANTOPRAZOLE SODIUM 40 MG PO TBEC: 40.0000 mg | DELAYED_RELEASE_TABLET | Freq: Every day | ORAL | Status: DC

## 2011-07-07 MED ORDER — ASPIRIN 81 MG PO TBEC: 81.0000 mg | DELAYED_RELEASE_TABLET | Freq: Every day | ORAL | Status: DC

## 2011-07-07 MED ORDER — FOOD THICKENER (THICKENUP CLEAR): 1.0000 | ORAL | Status: DC | PRN

## 2011-07-07 MED ORDER — ENSURE CLINICAL ST REVIGOR PO LIQD: 237.0000 mL | Freq: Every day | ORAL | Status: DC

## 2011-07-07 MED ORDER — ASPIRIN-DIPYRIDAMOLE ER 25-200 MG PO CP12: 1.0000 | ORAL_CAPSULE | Freq: Every day | ORAL | Status: AC

## 2011-07-07 MED ORDER — ASPIRIN-DIPYRIDAMOLE ER 25-200 MG PO CP12: 1.0000 | ORAL_CAPSULE | Freq: Two times a day (BID) | ORAL | Status: AC

## 2011-07-07 MED ORDER — POTASSIUM CHLORIDE CRYS ER 20 MEQ PO TBCR: 40.0000 meq | EXTENDED_RELEASE_TABLET | Freq: Every day | ORAL | Status: DC

## 2011-07-07 MED ORDER — DILTIAZEM HCL 30 MG PO TABS: 30.0000 mg | ORAL_TABLET | Freq: Four times a day (QID) | ORAL | Status: DC

## 2011-07-07 MED ORDER — ROSUVASTATIN CALCIUM 10 MG PO TABS: 10.0000 mg | ORAL_TABLET | Freq: Every day | ORAL | Status: DC

## 2011-07-07 NOTE — Discharge Summary (Signed)
Physician Discharge Summary  Patient ID: [name] MRN: [id number] DOB/AGE: [id number] y.o.  Admit date: 07/02/2011 Discharge date: 07/07/2011  Primary Care Physician:  No primary provider on file.   Discharge Diagnoses:   1-Seizure 2-Stroke 3-Acute respiratory failure (resolved) 4-Dementia 5-Atrial fibrillation 6-HLD (hyperlipidemia) 7-HTN (hypertension)  Present on Admission:  .Tachycardia (a. Fib with RVR) .Dementia .Acute respiratory failure  Current Discharge Medication List    START taking these medications   Details  aspirin EC 81 MG EC tablet Take 1 tablet (81 mg total) by mouth daily.    diltiazem (CARDIZEM) 30 MG tablet Take 1 tablet (30 mg total) by mouth every 6 (six) hours.    !! dipyridamole-aspirin (AGGRENOX) 25-200 MG per 12 hr capsule Take 1 capsule by mouth at bedtime.    !! dipyridamole-aspirin (AGGRENOX) 25-200 MG per 12 hr capsule Take 1 capsule by mouth 2 (two) times daily.    feeding supplement (ENSURE CLINICAL STRENGTH) LIQD Take 237 mLs by mouth daily.    food thickener (RESOURCE THICKENUP CLEAR) POWD Take 1 scoop by mouth as needed (Honey thick per instructions).    pantoprazole (PROTONIX) 40 MG tablet Take 1 tablet (40 mg total) by mouth daily at 12 noon.    potassium chloride SA (K-DUR,KLOR-CON) 20 MEQ tablet Take 2 tablets (40 mEq total) by mouth daily.    rosuvastatin (CRESTOR) 10 MG tablet Take 1 tablet (10 mg total) by mouth daily at 6 PM.     !! - Potential duplicate medications found. Please discuss with provider.    CONTINUE these medications which have NOT CHANGED   Details  donepezil (ARICEPT) 10 MG tablet Take 10 mg by mouth at bedtime as needed.      Fesoterodine Fumarate 8 MG TB24 Take 8 mg by mouth daily.      folic acid (FOLVITE) 1 MG tablet Take 1 mg by mouth daily.      galantamine (RAZADYNE ER) 16 MG 24 hr capsule Take 16 mg by mouth daily with breakfast.      hydrochlorothiazide (HYDRODIURIL) 25 MG tablet  Take 25 mg by mouth daily.      levETIRAcetam (KEPPRA) 500 MG tablet Take 500 mg by mouth daily.      lisinopril (PRINIVIL,ZESTRIL) 40 MG tablet Take 40 mg by mouth daily.      memantine (NAMENDA) 10 MG tablet Take 10 mg by mouth 2 (two) times daily.      NIFEdipine (PROCARDIA-XL/ADALAT CC) 60 MG 24 hr tablet Take 60 mg by mouth daily.      PHENobarbital (LUMINAL) 32.4 MG tablet Take 32.4 mg by mouth 2 (two) times daily.      phenytoin (DILANTIN) 100 MG ER capsule Take 100 mg by mouth 4 (four) times daily.        STOP taking these medications     clopidogrel (PLAVIX) 75 MG tablet      pravastatin (PRAVACHOL) 80 MG tablet          Disposition and Follow-up:  Patient discharge in stable and improved condition to [facility] for further physical/OT/SPT rehab. Patient with stable vital signs and with new regimen to help/prevent further strokes. He will follow with Dr. Pearlean Brownie in 2 months and in 2 weeks with PCP. Patient advised to follow a low sodium/low fat diet and to be compliant with his medications.  Consults:   [facility] Neurology   Significant Diagnostic Studies:  Ct Head Wo Contrast  07/02/2011  *RADIOLOGY REPORT*  Clinical Data: Left-sided weakness, aphasia  CT HEAD WITHOUT CONTRAST  Technique:  Contiguous axial images were obtained from the base of the skull through the vertex without contrast.  Comparison: MRI brain dated 07/22/2010  Findings: Hypodensity in the right parietal region (series 2/image 22), possibly reflecting an age indeterminate infarct, new from prior MRI.  No evidence of parenchymal hemorrhage or extra-axial fluid collection.  No mass lesion, mass effect, or midline shift.  Extensive subcortical white matter and periventricular small vessel ischemic changes.  Intracranial atherosclerosis.  Global cortical atrophy with secondary ventriculomegaly.  The visualized paranasal sinuses are essentially clear. The mastoid air cells are unopacified.  No evidence of  calvarial fracture.  IMPRESSION: Hypodensity in the right parietal region, possibly reflecting an age indeterminate infarct.  Extensive small vessel ischemic changes with atrophy and intracranial atherosclerosis.  Original Report Authenticated By: Charline Bills, M.D.   Dg Chest Port 1 View  07/02/2011  *RADIOLOGY REPORT*  Clinical Data: Post intubation  PORTABLE CHEST - 1 VIEW  Comparison: 08/19/2005  Findings: Endotracheal tube terminates 5 cm above the carina.  The lungs are essentially clear. No pleural effusion or pneumothorax.  Mild cardiomegaly. Postsurgical changes related to prior CABG.  IMPRESSION: Endotracheal tube terminates 5 cm above the carina.  Original Report Authenticated By: Charline Bills, M.D.    Brief H and P: For complete details please refer to admission H and P, but in brief 65 y/o male admitted to the hospital secondary to acute CVA and seizure. Initially was admitted to [facility] secondary to needs of intubation for airway protection. Patient also found to be on a. Fib with RVR requiring cardizem drip.  Hospital Course:  1-Seizure: Secondary to medication no compliance and also due to new stroke. Now stable and without any seizure activity. Will continue anticonvulsants as prescribed. 2-Stroke: Per neurology will change plavix to aggrenox. Patient will use ASA for 2 weeks while increasing aggrenox to bid. Patient will follow with Dr. Pearlean Brownie in 2 months. 3-Acute respiratory failure (resolved): initially secondary to seizure disorder. Required intubation; now stable and w/o SOB. 4-Dementia:Continue aricept and namenda. 5-Atrial fibrillation with RVR: now rate controlled. Patient will continue usin procardia and diltiazem as instructed. 6-HLD (hyperlipidemia): Continue statins; change to crestor 10mg  daily to decrease risk of rhabdomyolysis with concomitant use of diltiazem. 7-HTN (hypertension): Stable and well controlled. Continue current medications.    Time spent on  Discharge: 40 minutes  Signed: Lanny Donoso 07/07/2011, 1:50 PM

## 2011-07-07 NOTE — Progress Notes (Addendum)
  Speech-Language Pathology:  Treatment Note  Subjective:  Patient sitting in recliner in room for Speech Therapy session "Hopefully I get to go home tomorrow and see the family. Some of them I haven't seen in years."  Objective:  Patient oriented to time, place, situation ("I had a stroke" and independently looked at calendar on wall. Mild hesitations and disruptions in expressive speech fluency noted at conversational level, however patient repaired errors without cues. Information patient gave when responding to ST's questions regarding biographical, recent history and recall of general medical interventions and therapy completed was accurate and verified by medical chart/documentation. Patient stated safety precautions learned from PT following ST question cue, "She just said...that thing over there I use (pointed to walker), keep my feet on the inside, got to pay attention to what I'm doing." Minimal cues overall for patient to elaborate on information provided. Regarding awareness to deficits, patient stated, "I can't see a heck of a lot of difference in it." Patient does have premorbid cognitive deficits from stroke and stated that he has not driven since his first stroke, "several years ago. I don't want to take any chances, I don't want to kill nobody"  Assessment:  Patient is demonstrating improved cognitive-linguistic and speech function as compared to initial deficits upon admission and SLP evaluation. Continues to require minimal to intermittent cues for safety awareness, elaboration of responses and continues with min-mild cognitive processing delays.  Recommendations:  Continue with Speech Therapy treatment as prescribed and collaborate with team to determine POC for discharge.  Pain:   none Intervention Required:   No Patient denied any pain at time of this treatment session.   Goals: Goals Partially Met and progressing towards meeting goals.  Angela Nevin, MA, CCC-SLP Speech  Therapy [facility]

## 2011-07-07 NOTE — Progress Notes (Signed)
CSW faxed dc summary and medications to SNF. CSW prepared dc packet. CSW informed patient and patient's wife of dc. Wife was upset because she stated she had asked earlier this morning and the RN did not know if patient would be dc or not. Wife stated she would transport patient to SNF. CSW informed RN of dc and asked her to give patient dc packet. CSW is signing off. Unk Lightning [phone]

## 2011-07-07 NOTE — Progress Notes (Signed)
Physical Therapy Treatment Patient Details Name: [name] MRN: [id number] DOB: 11-29-1945 Today's Date: 07/07/2011  PT Assessment/Plan  PT - Assessment/Plan PT Plan: Discharge plan remains appropriate PT Frequency: Min 4X/week Follow Up Recommendations: Skilled nursing facility Equipment Recommended: Defer to next venue PT Goals  Acute Rehab PT Goals PT Goal: Rolling Supine to Right Side - Progress: Progressing toward goal PT Goal: Supine/Side to Sit - Progress: Progressing toward goal PT Transfer Goal: Sit to Stand/Stand to Sit - Progress: Progressing toward goal PT Goal: Ambulate - Progress: Progressing toward goal  PT Treatment Precautions/Restrictions  Precautions Precautions: Fall Precaution Comments: Decreased balance with gait - fall risk Required Braces or Orthoses: No Restrictions Weight Bearing Restrictions: No Mobility (including Balance) Bed Mobility Supine to Sit: 6: Modified independent (Device/Increase time) Sitting - Scoot to Edge of Bed: 6: Modified independent (Device/Increase time) Transfers Sit to Stand: Other (comment);From chair/3-in-1;From bed;From toilet;With upper extremity assist (Min Guard Assist) Sit to Stand Details (indicate cue type and reason): Cues for safe hand placement & technique.  6x's from chair for instruction/safety.   Stand to Sit: Other (comment);To bed;To chair/3-in-1;To toilet;With upper extremity assist (min guard assist) Stand to Sit Details: cues for hand placement & use of arms to control descent Ambulation/Gait Ambulation/Gait Assistance: 4: Min assist Ambulation/Gait Assistance Details (indicate cue type and reason): assistance for balance; pt still ocassionally kicks back leg of RW with Left foot, cues to stay inside RW, increase step height Left foot, & for safety.   Pt easily distracted with busy environment- Pt unable to keep focus on one object despite max cueing to focus on target.   Ambulation Distance (Feet): 300  Feet Assistive device: Rolling walker Gait Pattern: Decreased hip/knee flexion - left;Decreased step length - left    Exercise  General Exercises - Lower Extremity Long Arc Quad: AROM;Left;15 reps Hip Flexion/Marching: AROM;Left;Other reps (comment) (15 reps) Toe Raises: AROM;Both;15 reps Heel Raises: AROM;Both;15 reps End of Session PT - End of Session Equipment Utilized During Treatment: Gait belt Activity Tolerance: Patient tolerated treatment well Patient left: in chair;with call bell in reach General Behavior During Session: [facility] for tasks performed Cognition: Impaired Cognitive Impairment: decreased safety awareness, decreased awareness of deficits, difficulty attending to task in busy environment.  questionable carryover of safe technique with mobiility/activity from session to session.    Lara Mulch 07/07/2011, 3:05 PM [phone]

## 2011-08-25 ENCOUNTER — Inpatient Hospital Stay (HOSPITAL_COMMUNITY)
Admission: EM | Admit: 2011-08-25 | Discharge: 2011-08-30 | DRG: 641 | Disposition: A | Discharge status: Skilled Nursing Facility | Payer: Medicare Other | Attending: Internal Medicine | Admitting: Internal Medicine

## 2011-08-25 ENCOUNTER — Encounter (HOSPITAL_COMMUNITY): Payer: Self-pay | Admitting: Emergency Medicine

## 2011-08-25 ENCOUNTER — Emergency Department (HOSPITAL_COMMUNITY): Payer: Medicare Other

## 2011-08-25 DIAGNOSIS — F0280 Dementia in other diseases classified elsewhere without behavioral disturbance: Secondary | ICD-10-CM | POA: Diagnosis present

## 2011-08-25 DIAGNOSIS — Z87891 Personal history of nicotine dependence: Secondary | ICD-10-CM

## 2011-08-25 DIAGNOSIS — Z951 Presence of aortocoronary bypass graft: Secondary | ICD-10-CM

## 2011-08-25 DIAGNOSIS — R5381 Other malaise: Secondary | ICD-10-CM | POA: Diagnosis present

## 2011-08-25 DIAGNOSIS — G40909 Epilepsy, unspecified, not intractable, without status epilepticus: Secondary | ICD-10-CM | POA: Diagnosis present

## 2011-08-25 DIAGNOSIS — Z860100 Personal history of colon polyps, unspecified: Secondary | ICD-10-CM

## 2011-08-25 DIAGNOSIS — I10 Essential (primary) hypertension: Secondary | ICD-10-CM

## 2011-08-25 DIAGNOSIS — I2510 Atherosclerotic heart disease of native coronary artery without angina pectoris: Secondary | ICD-10-CM | POA: Diagnosis present

## 2011-08-25 DIAGNOSIS — W19XXXA Unspecified fall, initial encounter: Secondary | ICD-10-CM

## 2011-08-25 DIAGNOSIS — R569 Unspecified convulsions: Secondary | ICD-10-CM

## 2011-08-25 DIAGNOSIS — Z9119 Patient's noncompliance with other medical treatment and regimen: Secondary | ICD-10-CM

## 2011-08-25 DIAGNOSIS — G309 Alzheimer's disease, unspecified: Secondary | ICD-10-CM | POA: Diagnosis present

## 2011-08-25 DIAGNOSIS — Z79899 Other long term (current) drug therapy: Secondary | ICD-10-CM

## 2011-08-25 DIAGNOSIS — Z6825 Body mass index (BMI) 25.0-25.9, adult: Secondary | ICD-10-CM

## 2011-08-25 DIAGNOSIS — J449 Chronic obstructive pulmonary disease, unspecified: Secondary | ICD-10-CM | POA: Diagnosis present

## 2011-08-25 DIAGNOSIS — F0390 Unspecified dementia without behavioral disturbance: Secondary | ICD-10-CM

## 2011-08-25 DIAGNOSIS — J4489 Other specified chronic obstructive pulmonary disease: Secondary | ICD-10-CM | POA: Diagnosis present

## 2011-08-25 DIAGNOSIS — J40 Bronchitis, not specified as acute or chronic: Secondary | ICD-10-CM

## 2011-08-25 DIAGNOSIS — Z8673 Personal history of transient ischemic attack (TIA), and cerebral infarction without residual deficits: Secondary | ICD-10-CM

## 2011-08-25 DIAGNOSIS — E785 Hyperlipidemia, unspecified: Secondary | ICD-10-CM

## 2011-08-25 DIAGNOSIS — Z9181 History of falling: Secondary | ICD-10-CM

## 2011-08-25 DIAGNOSIS — Z91199 Patient's noncompliance with other medical treatment and regimen due to unspecified reason: Secondary | ICD-10-CM

## 2011-08-25 DIAGNOSIS — I4891 Unspecified atrial fibrillation: Secondary | ICD-10-CM

## 2011-08-25 DIAGNOSIS — I509 Heart failure, unspecified: Secondary | ICD-10-CM | POA: Diagnosis present

## 2011-08-25 DIAGNOSIS — E860 Dehydration: Principal | ICD-10-CM

## 2011-08-25 DIAGNOSIS — Z7982 Long term (current) use of aspirin: Secondary | ICD-10-CM

## 2011-08-25 DIAGNOSIS — R6251 Failure to thrive (child): Secondary | ICD-10-CM

## 2011-08-25 DIAGNOSIS — I639 Cerebral infarction, unspecified: Secondary | ICD-10-CM

## 2011-08-25 DIAGNOSIS — R627 Adult failure to thrive: Secondary | ICD-10-CM | POA: Diagnosis present

## 2011-08-25 HISTORY — DX: Acute myocardial infarction, unspecified: I21.9

## 2011-08-25 LAB — DIFFERENTIAL
Basophils Absolute: 0 10*3/uL (ref 0.0–0.1)
Basophils Relative: 0 % (ref 0–1)
Eosinophils Relative: 1 % (ref 0–5)
Monocytes Absolute: 0.5 10*3/uL (ref 0.1–1.0)

## 2011-08-25 LAB — COMPREHENSIVE METABOLIC PANEL
AST: 16 U/L (ref 0–37)
Albumin: 3.4 g/dL — ABNORMAL LOW (ref 3.5–5.2)
Calcium: 10 mg/dL (ref 8.4–10.5)
Creatinine, Ser: 1.09 mg/dL (ref 0.50–1.35)
Sodium: 134 mEq/L — ABNORMAL LOW (ref 135–145)
Total Protein: 8 g/dL (ref 6.0–8.3)

## 2011-08-25 LAB — URINALYSIS, ROUTINE W REFLEX MICROSCOPIC
Glucose, UA: NEGATIVE mg/dL
Ketones, ur: NEGATIVE mg/dL
Leukocytes, UA: NEGATIVE
Protein, ur: NEGATIVE mg/dL
Urobilinogen, UA: 1 mg/dL (ref 0.0–1.0)

## 2011-08-25 LAB — CBC
HCT: 40.5 % (ref 39.0–52.0)
MCH: 30 pg (ref 26.0–34.0)
MCHC: 33.8 g/dL (ref 30.0–36.0)
MCHC: 33.1 g/dL (ref 30.0–36.0)
MCV: 90.2 fL (ref 78.0–100.0)
Platelets: 375 10*3/uL (ref 150–400)
Platelets: 360 10*3/uL (ref 150–400)
RDW: 13.6 % (ref 11.5–15.5)
RDW: 13.5 % (ref 11.5–15.5)

## 2011-08-25 LAB — CALCIUM: Calcium: 9.2 mg/dL (ref 8.4–10.5)

## 2011-08-25 LAB — URINE CULTURE
Colony Count: NO GROWTH
Culture  Setup Time: 201301091534
Culture: NO GROWTH
Special Requests: NORMAL

## 2011-08-25 LAB — PHOSPHORUS: Phosphorus: 2.9 mg/dL (ref 2.3–4.6)

## 2011-08-25 MED ORDER — ALBUTEROL SULFATE (5 MG/ML) 0.5% IN NEBU: 2.5000 mg | INHALATION_SOLUTION | Freq: Four times a day (QID) | RESPIRATORY_TRACT | Status: DC | PRN

## 2011-08-25 MED ORDER — ONDANSETRON HCL 4 MG/2ML IJ SOLN: 4.0000 mg | Freq: Four times a day (QID) | INTRAMUSCULAR | Status: DC | PRN

## 2011-08-25 MED ORDER — ACETAMINOPHEN 650 MG RE SUPP: 650.0000 mg | Freq: Four times a day (QID) | RECTAL | Status: DC | PRN

## 2011-08-25 MED ORDER — ENOXAPARIN SODIUM 40 MG/0.4ML ~~LOC~~ SOLN
40.0000 mg | SUBCUTANEOUS | Status: DC
Start: 2011-08-25 — End: 2011-08-30
  Administered 2011-08-25 – 2011-08-29 (×5): 40 mg via SUBCUTANEOUS
  Filled 2011-08-25 (×6): qty 0.4

## 2011-08-25 MED ORDER — ASPIRIN-DIPYRIDAMOLE ER 25-200 MG PO CP12
1.0000 | ORAL_CAPSULE | Freq: Every day | ORAL | Status: DC
  Filled 2011-08-25: qty 1

## 2011-08-25 MED ORDER — IPRATROPIUM BROMIDE 0.02 % IN SOLN
0.5000 mg | Freq: Four times a day (QID) | RESPIRATORY_TRACT | Status: DC
  Administered 2011-08-25: 0.5 mg via RESPIRATORY_TRACT
  Filled 2011-08-25: qty 2.5

## 2011-08-25 MED ORDER — SIMVASTATIN 5 MG PO TABS
5.0000 mg | ORAL_TABLET | Freq: Every day | ORAL | Status: DC
  Administered 2011-08-26 – 2011-08-29 (×4): 5 mg via ORAL
  Filled 2011-08-25 (×5): qty 1

## 2011-08-25 MED ORDER — SODIUM CHLORIDE 0.9 % IV SOLN: Freq: Once | INTRAVENOUS | Status: DC

## 2011-08-25 MED ORDER — ASPIRIN-DIPYRIDAMOLE ER 25-200 MG PO CP12
1.0000 | ORAL_CAPSULE | Freq: Two times a day (BID) | ORAL | Status: DC
  Administered 2011-08-26 – 2011-08-30 (×10): 1 via ORAL
  Filled 2011-08-25 (×12): qty 1

## 2011-08-25 MED ORDER — GALANTAMINE HYDROBROMIDE ER 16 MG PO CP24: 16.0000 mg | ORAL_CAPSULE | Freq: Every day | ORAL | Status: DC

## 2011-08-25 MED ORDER — PHENYTOIN SODIUM EXTENDED 100 MG PO CAPS
200.0000 mg | ORAL_CAPSULE | Freq: Two times a day (BID) | ORAL | Status: DC
  Administered 2011-08-26 – 2011-08-30 (×10): 200 mg via ORAL
  Filled 2011-08-25 (×12): qty 2

## 2011-08-25 MED ORDER — FOLIC ACID 1 MG PO TABS
1.0000 mg | ORAL_TABLET | Freq: Every day | ORAL | Status: DC
  Administered 2011-08-26 – 2011-08-30 (×6): 1 mg via ORAL
  Filled 2011-08-25 (×7): qty 1

## 2011-08-25 MED ORDER — FESOTERODINE FUMARATE ER 8 MG PO TB24
8.0000 mg | ORAL_TABLET | Freq: Every day | ORAL | Status: DC
  Administered 2011-08-26: 8 mg via ORAL
  Filled 2011-08-25: qty 1

## 2011-08-25 MED ORDER — ALBUTEROL SULFATE (5 MG/ML) 0.5% IN NEBU
5.0000 mg | INHALATION_SOLUTION | RESPIRATORY_TRACT | Status: AC
  Administered 2011-08-25: 5 mg via RESPIRATORY_TRACT
  Filled 2011-08-25: qty 1

## 2011-08-25 MED ORDER — LEVETIRACETAM 500 MG PO TABS
500.0000 mg | ORAL_TABLET | Freq: Two times a day (BID) | ORAL | Status: DC
  Administered 2011-08-26 – 2011-08-30 (×10): 500 mg via ORAL
  Filled 2011-08-25 (×12): qty 1

## 2011-08-25 MED ORDER — PHENOBARBITAL 32.4 MG PO TABS
32.4000 mg | ORAL_TABLET | Freq: Two times a day (BID) | ORAL | Status: DC
  Administered 2011-08-26 – 2011-08-30 (×10): 32.4 mg via ORAL
  Filled 2011-08-25 (×10): qty 1

## 2011-08-25 MED ORDER — ALBUTEROL SULFATE (5 MG/ML) 0.5% IN NEBU
2.5000 mg | INHALATION_SOLUTION | Freq: Two times a day (BID) | RESPIRATORY_TRACT | Status: DC
  Administered 2011-08-26 – 2011-08-27 (×4): 2.5 mg via RESPIRATORY_TRACT
  Filled 2011-08-25 (×4): qty 0.5

## 2011-08-25 MED ORDER — PANTOPRAZOLE SODIUM 40 MG PO TBEC
40.0000 mg | DELAYED_RELEASE_TABLET | Freq: Every day | ORAL | Status: DC
  Administered 2011-08-25 – 2011-08-30 (×6): 40 mg via ORAL
  Filled 2011-08-25 (×6): qty 1

## 2011-08-25 MED ORDER — MEMANTINE HCL 10 MG PO TABS
10.0000 mg | ORAL_TABLET | Freq: Two times a day (BID) | ORAL | Status: DC
  Administered 2011-08-26 – 2011-08-30 (×10): 10 mg via ORAL
  Filled 2011-08-25 (×12): qty 1

## 2011-08-25 MED ORDER — DILTIAZEM HCL 30 MG PO TABS
30.0000 mg | ORAL_TABLET | Freq: Four times a day (QID) | ORAL | Status: DC
  Administered 2011-08-26 – 2011-08-30 (×16): 30 mg via ORAL
  Filled 2011-08-25 (×23): qty 1

## 2011-08-25 MED ORDER — CLOPIDOGREL BISULFATE 75 MG PO TABS
75.0000 mg | ORAL_TABLET | Freq: Every day | ORAL | Status: DC
  Administered 2011-08-26 – 2011-08-30 (×6): 75 mg via ORAL
  Filled 2011-08-25 (×8): qty 1

## 2011-08-25 MED ORDER — IPRATROPIUM BROMIDE 0.02 % IN SOLN
0.5000 mg | Freq: Two times a day (BID) | RESPIRATORY_TRACT | Status: DC
  Administered 2011-08-26 – 2011-08-27 (×4): 0.5 mg via RESPIRATORY_TRACT
  Filled 2011-08-25 (×4): qty 2.5

## 2011-08-25 MED ORDER — SODIUM CHLORIDE 0.9 % IV SOLN
INTRAVENOUS | Status: AC
  Administered 2011-08-25: 18:00:00 via INTRAVENOUS
  Administered 2011-08-26: 1000 mL via INTRAVENOUS
  Administered 2011-08-26: 04:00:00 via INTRAVENOUS

## 2011-08-25 MED ORDER — POTASSIUM CHLORIDE CRYS ER 20 MEQ PO TBCR
40.0000 meq | EXTENDED_RELEASE_TABLET | Freq: Every day | ORAL | Status: DC
  Administered 2011-08-26 – 2011-08-27 (×3): 40 meq via ORAL
  Administered 2011-08-28 (×2): 20 meq via ORAL
  Administered 2011-08-29 – 2011-08-30 (×2): 40 meq via ORAL
  Filled 2011-08-25 (×6): qty 2
  Filled 2011-08-25: qty 1

## 2011-08-25 MED ORDER — ONDANSETRON HCL 4 MG PO TABS: 4.0000 mg | ORAL_TABLET | Freq: Four times a day (QID) | ORAL | Status: DC | PRN

## 2011-08-25 MED ORDER — ALBUTEROL SULFATE (5 MG/ML) 0.5% IN NEBU
2.5000 mg | INHALATION_SOLUTION | Freq: Four times a day (QID) | RESPIRATORY_TRACT | Status: DC
  Administered 2011-08-25: 2.5 mg via RESPIRATORY_TRACT
  Filled 2011-08-25: qty 0.5

## 2011-08-25 MED ORDER — ACETAMINOPHEN 325 MG PO TABS: 650.0000 mg | ORAL_TABLET | Freq: Four times a day (QID) | ORAL | Status: DC | PRN

## 2011-08-25 MED ORDER — SODIUM CHLORIDE 0.9 % IV BOLUS (SEPSIS)
1000.0000 mL | Freq: Once | INTRAVENOUS | Status: AC
  Administered 2011-08-25: 1000 mL via INTRAVENOUS

## 2011-08-25 NOTE — ED Notes (Signed)
Report given to Dennie Bible, RN on 5500. Patient being transported to 5501-02.

## 2011-08-25 NOTE — ED Provider Notes (Signed)
History     CSN: [id number]  Arrival date & time 08/25/11  1054   First MD Initiated Contact with Patient 08/25/11 1153      Chief Complaint  Patient presents with  . Weakness  . Fall    (Consider location/radiation/quality/duration/timing/severity/associated sxs/prior treatment) HPI Comments: Family member reports patient has had one week of increased weakness, not eating, increased confusion, and a fall this morning.  States patient was placed in [facility] this fall following a stroke and seizure.  States patient demanded to come home in December and has since barely gotten out of bed.  States he has had a cough for several weeks, has been on a z-pak and now amoxicillin for a dental abscess - states cough is getting worse.  This week, patient has gotten out of bed several times and urinated on the floor - stating he thought he was outside.  States he is not eating or drinking much, only drinking part of a pepsi when he takes his medications.  Pt has a hx of alzheimers.  Family member denies N/V/D, any complaints of pain, any apparent wheezing or SOB.  Patient denies CP, SOB, abdominal pain, pain anywhere, dysuria.  Pt states he doesn't want to go home, he wants to go back to assisted living.   Patient is a 66 y.o. male presenting with weakness and fall. The history is provided by the patient.  Weakness  Additional symptoms include weakness.  Fall    Past Medical History  Diagnosis Date  . Hypertension   . Stroke     Multiple  . Seizures     partial and generalized, poor compliance    Past Surgical History  Procedure Date  . Coronary artery bypass graft     History reviewed. No pertinent family history.  History  Substance Use Topics  . Smoking status: Former Smoker -- 4.0 packs/day for 40 years    Types: Cigarettes  . Smokeless tobacco: Former Neurosurgeon    Quit date: 01/03/2004  . Alcohol Use: No      Review of Systems  Unable to perform ROS Neurological:  Positive for weakness.  All other systems reviewed and are negative.    Allergies  Review of patient's allergies indicates no known allergies.  Home Medications   Current Outpatient Rx  Name Route Sig Dispense Refill  . CLOPIDOGREL BISULFATE 75 MG PO TABS Oral Take 75 mg by mouth daily.    Marland Kitchen DILTIAZEM HCL 30 MG PO TABS Oral Take 1 tablet (30 mg total) by mouth every 6 (six) hours.    . ASPIRIN-DIPYRIDAMOLE 25-200 MG PO CP12 Oral Take 1 capsule by mouth at bedtime.    . ASPIRIN-DIPYRIDAMOLE 25-200 MG PO CP12 Oral Take 1 capsule by mouth 2 (two) times daily.    . DONEPEZIL HCL 10 MG PO TABS Oral Take 10 mg by mouth at bedtime as needed.      . FESOTERODINE FUMARATE ER 8 MG PO TB24 Oral Take 8 mg by mouth daily.      Marland Kitchen FOLIC ACID 1 MG PO TABS Oral Take 1 mg by mouth daily.      Marland Kitchen GALANTAMINE HYDROBROMIDE ER 16 MG PO CP24 Oral Take 16 mg by mouth daily with breakfast.      . HYDROCHLOROTHIAZIDE 25 MG PO TABS Oral Take 25 mg by mouth daily.      Marland Kitchen LEVETIRACETAM 500 MG PO TABS Oral Take 500 mg by mouth every 12 (twelve) hours.     Marland Kitchen  LISINOPRIL 40 MG PO TABS Oral Take 40 mg by mouth daily.      Marland Kitchen MEMANTINE HCL 10 MG PO TABS Oral Take 10 mg by mouth 2 (two) times daily.      Marland Kitchen NIFEDIPINE ER 60 MG PO TB24 Oral Take 60 mg by mouth daily.      Marland Kitchen PANTOPRAZOLE SODIUM 40 MG PO TBEC Oral Take 1 tablet (40 mg total) by mouth daily at 12 noon.    Marland Kitchen PHENOBARBITAL 32.4 MG PO TABS Oral Take 32.4 mg by mouth 2 (two) times daily.      Marland Kitchen PHENYTOIN SODIUM EXTENDED 100 MG PO CAPS Oral Take 200 mg by mouth 2 (two) times daily.     Marland Kitchen POTASSIUM CHLORIDE CRYS ER 20 MEQ PO TBCR Oral Take 2 tablets (40 mEq total) by mouth daily.    Marland Kitchen PRAVASTATIN SODIUM 40 MG PO TABS Oral Take 40 mg by mouth daily.      BP 96/68  Pulse 71  Temp 97.3 F (36.3 C)  Resp 18  SpO2 100%  Physical Exam  Nursing note and vitals reviewed. Constitutional: He appears well-developed and well-nourished.  HENT:  Head: Normocephalic and  atraumatic.  Neck: Neck supple.  Cardiovascular: Normal rate, regular rhythm and normal heart sounds.   Pulmonary/Chest: Breath sounds normal. No respiratory distress. He has no wheezes. He has no rales. He exhibits no tenderness.  Abdominal: Soft. Bowel sounds are normal. He exhibits no distension and no mass. There is no tenderness. There is no rebound and no guarding.  Musculoskeletal: He exhibits no edema and no tenderness.  Neurological: He is alert.       Patient oriented to self and place.  Thinks that it is Friday and December.  When asked the year, he states it is December (x2).      ED Course  Procedures (including critical care time)  Labs Reviewed  CBC - Abnormal; Notable for the following:    WBC 10.6 (*)    All other components within normal limits  COMPREHENSIVE METABOLIC PANEL - Abnormal; Notable for the following:    Sodium 134 (*)    BUN 26 (*)    Albumin 3.4 (*)    Alkaline Phosphatase 164 (*)    Total Bilirubin 0.2 (*)    GFR calc non Af Amer 70 (*)    GFR calc Af Amer 81 (*)    All other components within normal limits  DIFFERENTIAL  URINALYSIS, ROUTINE W REFLEX MICROSCOPIC  URINE CULTURE   Dg Chest 2 View  08/25/2011  *RADIOLOGY REPORT*  Clinical Data: Altered mental status, cough, weakness  CHEST - 2 VIEW  Comparison: 07/04/2011  Findings: Upper normal heart size post CABG. Tortuous aorta. Pulmonary vascularity normal. Question bilateral nipple shadows. Lungs appear emphysematous but clear. No pleural effusion or pneumothorax. No acute osseous findings. Bones appear mildly demineralized.  IMPRESSION: Post CABG. Emphysematous changes with question bilateral nipple shadows; recommend repeat PA chest radiograph with nipple markers to exclude pulmonary nodules.  Original Report Authenticated By: Lollie Marrow, M.D.   Ct Head Wo Contrast (if Head Trauma)  08/25/2011  *RADIOLOGY REPORT*  Clinical Data:  Fall.  Weakness  CT HEAD WITHOUT CONTRAST CT CERVICAL SPINE  WITHOUT CONTRAST  Technique:  Multidetector CT imaging of the head and cervical spine was performed following the standard protocol without intravenous contrast.  Multiplanar CT image reconstructions of the cervical spine were also generated.  Comparison:  CT 07/02/2011  CT HEAD  Findings: Generalized  atrophy of a moderate degree.  Chronic microvascular ischemia is present in the white matter.  Negative for acute infarct.  Negative for hemorrhage or mass.  No subdural hematoma.  Extensive sinusitis with mucosal edema in the sinuses and an air- fluid level left maxillary sinus.  Negative for skull fracture.  IMPRESSION: Atrophy and chronic ischemic change.  No acute intracranial abnormality.  Sinusitis.  CT CERVICAL SPINE  Findings: Normal alignment.  No fracture of the cervical spine. Mild disc degeneration and spondylosis C3-C7.  Apical emphysema bilaterally.  Carotid atherosclerotic disease.  IMPRESSION: Negative for fracture.  Original Report Authenticated By: Camelia Phenes, M.D.   Ct Cervical Spine Wo Contrast  08/25/2011  *RADIOLOGY REPORT*  Clinical Data:  Fall.  Weakness  CT HEAD WITHOUT CONTRAST CT CERVICAL SPINE WITHOUT CONTRAST  Technique:  Multidetector CT imaging of the head and cervical spine was performed following the standard protocol without intravenous contrast.  Multiplanar CT image reconstructions of the cervical spine were also generated.  Comparison:  CT 07/02/2011  CT HEAD  Findings: Generalized atrophy of a moderate degree.  Chronic microvascular ischemia is present in the white matter.  Negative for acute infarct.  Negative for hemorrhage or mass.  No subdural hematoma.  Extensive sinusitis with mucosal edema in the sinuses and an air- fluid level left maxillary sinus.  Negative for skull fracture.  IMPRESSION: Atrophy and chronic ischemic change.  No acute intracranial abnormality.  Sinusitis.  CT CERVICAL SPINE  Findings: Normal alignment.  No fracture of the cervical spine. Mild disc  degeneration and spondylosis C3-C7.  Apical emphysema bilaterally.  Carotid atherosclerotic disease.  IMPRESSION: Negative for fracture.  Original Report Authenticated By: Camelia Phenes, M.D.    Date: 08/25/2011  Rate: 78  Rhythm: normal sinus rhythm  QRS Axis: normal  Intervals: normal  ST/T Wave abnormalities: nonspecific T wave changes. ST changes  Conduction Disutrbances:right bundle branch block  Narrative Interpretation:   Old EKG Reviewed: no significant changes    1. Failure to thrive   2. Dehydration   3. Bronchitis       MDM  Patient who demanded d/c from rehab/nursing facility last month since has not been getting out of bed, not eating, getting increasingly confused at home, admitted to [facility] for failure to thrive, dehydration, bronchitis and placement to nursing facility/assisted living.          Rise Patience, [place] 08/25/11 1558

## 2011-08-25 NOTE — ED Notes (Signed)
5501-02 [place] 

## 2011-08-25 NOTE — ED Notes (Signed)
IV team at bedside, using ultrasound in attempt to gain access

## 2011-08-25 NOTE — Progress Notes (Signed)
1810 Pt arrived to room from ED. Alert and oriented x 3.Applied telemetry . Call bell within reach.

## 2011-08-25 NOTE — H&P (Signed)
Umberto Pavek MRN: [id number] DOB/AGE: Mar 09, 1946 66 y.o. Primary Care Physician:No primary provider on file. Admit date: 08/25/2011   PRIMARY CARE PHYSICIAN:  Dr. Sharlot Gowda   PRIMARY CARDIOLOGIST:  Dr. Caprice Kluver    Chief Complaint: Failure to thrive   This is a 66 year old male with a recent history of CVA in November, was brought into the ER because of poor appetite, falls, noncompliance with medication use.Patient family states he has been in bed x one week since he was seen by his doctor and was given medication for a cough and cold. Patient is experiencing general weakness only and denies chest pain or general body pain or SOB. Patient resting with NAD at this time. History is obtained from the patient however the patient is a very poor historian, no family is present at the bedside at this time.  Family member reports patient has had one week of increased weakness, not eating, increased confusion, and a fall this morning. States patient was placed in [facility] this fall following a stroke and seizure. States patient demanded to come home in December and has since barely gotten out of bed. States he has had a cough for several weeks, has been on a z-pak and now amoxicillin for a dental abscess - states cough is getting worse. This week, patient has gotten out of bed several times and urinated on the floor - stating he thought he was outside. States he is not eating or drinking much, only drinking part of a pepsi when he takes his medications. Pt has a hx of alzheimers. Family member denies N/V/D, any complaints of pain, any apparent wheezing or SOB. Patient denies CP, SOB, abdominal pain, pain anywhere, dysuria. Pt states he doesn't want to go home, he wants to go back to assisted living. No fever chills or riders  Past Medical History  Diagnosis Date  . Hypertension   . Stroke     Multiple  . Seizures     partial and generalized, poor compliance     Hyperlipidemia.   Dementia.   . Deconditioning.   Coronary artery disease status post CABG x5 vessels.   .History of diastolic congestive heart failure.   History of multiple cerebrovascular accidents.   Chronic obstructive pulmonary disease.   Past surgical history  Status post lumbar surgery.   History of polypectomy in 2002 per Dr. Carman Ching.       Past Surgical History  Procedure Date  . Coronary artery bypass graft     Prior to Admission medications   Medication Sig Start Date End Date Taking? Authorizing Provider  clopidogrel (PLAVIX) 75 MG tablet Take 75 mg by mouth daily.   Yes Historical Provider, MD  diltiazem (CARDIZEM) 30 MG tablet Take 1 tablet (30 mg total) by mouth every 6 (six) hours. 07/07/11 07/06/12 Yes Vassie Loll, MD  dipyridamole-aspirin (AGGRENOX) 25-200 MG per 12 hr capsule Take 1 capsule by mouth at bedtime. 07/07/11 07/06/12 Yes Vassie Loll, MD  dipyridamole-aspirin (AGGRENOX) 25-200 MG per 12 hr capsule Take 1 capsule by mouth 2 (two) times daily. 07/07/11 07/06/12 Yes Vassie Loll, MD  donepezil (ARICEPT) 10 MG tablet Take 10 mg by mouth at bedtime as needed.     Yes Historical Provider, MD  Fesoterodine Fumarate 8 MG TB24 Take 8 mg by mouth daily.     Yes Historical Provider, MD  folic acid (FOLVITE) 1 MG tablet Take 1 mg by mouth daily.     Yes Historical Provider, MD  galantamine (RAZADYNE  ER) 16 MG 24 hr capsule Take 16 mg by mouth daily with breakfast.     Yes Historical Provider, MD  hydrochlorothiazide (HYDRODIURIL) 25 MG tablet Take 25 mg by mouth daily.     Yes Historical Provider, MD  levETIRAcetam (KEPPRA) 500 MG tablet Take 500 mg by mouth every 12 (twelve) hours.    Yes Historical Provider, MD  lisinopril (PRINIVIL,ZESTRIL) 40 MG tablet Take 40 mg by mouth daily.     Yes Historical Provider, MD  memantine (NAMENDA) 10 MG tablet Take 10 mg by mouth 2 (two) times daily.     Yes Historical Provider, MD  NIFEdipine (PROCARDIA-XL/ADALAT CC) 60 MG 24 hr tablet Take 60 mg  by mouth daily.     Yes Historical Provider, MD  pantoprazole (PROTONIX) 40 MG tablet Take 1 tablet (40 mg total) by mouth daily at 12 noon. 07/07/11 07/06/12 Yes Vassie Loll, MD  PHENobarbital (LUMINAL) 32.4 MG tablet Take 32.4 mg by mouth 2 (two) times daily.     Yes Historical Provider, MD  phenytoin (DILANTIN) 100 MG ER capsule Take 200 mg by mouth 2 (two) times daily.    Yes Historical Provider, MD  potassium chloride SA (K-DUR,KLOR-CON) 20 MEQ tablet Take 2 tablets (40 mEq total) by mouth daily. 07/07/11 07/06/12 Yes Vassie Loll, MD  pravastatin (PRAVACHOL) 40 MG tablet Take 40 mg by mouth daily.   Yes Historical Provider, MD    Allergies: No Known Allergies   FAMILY HISTORY:  Both parents are deceased, cause unspecified.  Social History:  reports that he has quit smoking. His smoking use included Cigarettes. He has a 160 pack-year smoking history. He quit smokeless tobacco use about 7 years ago. He reports that he does not drink alcohol or use illicit drugs.  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 rate 19, SpO2 100.00%.  Nursing note and vitals reviewed.  Constitutional: He appears well-developed and well-nourished.  HENT:  Head: Normocephalic and atraumatic.  Neck: Neck supple.  Cardiovascular: Normal rate, regular rhythm and normal heart sounds.  Pulmonary/Chest: Breath sounds normal. No respiratory distress. He has no wheezes. He has no rales. He exhibits no tenderness.  Abdominal: Soft. Bowel sounds are normal. He exhibits no distension and no mass. There is no tenderness. There is no rebound and no guarding.  Musculoskeletal: He exhibits no edema and no tenderness.  Neurological: He is alert.     No results found for this or any  previous visit (from the past 240 hour(s)).   Results for orders placed during the hospital encounter of 08/25/11 (from the past 48 hour(s))  CBC     Status: Abnormal   Collection Time   08/25/11 12:23 PM      Component Value Range Comment   WBC 10.6 (*) 4.0 - 10.5 (K/uL)    RBC 4.49  4.22 - 5.81 (MIL/uL)    Hemoglobin 13.7  13.0 - 17.0 (g/dL)    HCT 09.8  11.9 - 14.7 (%)    MCV 90.2  78.0 - 100.0 (fL)    MCH 30.5  26.0 - 34.0 (pg)    MCHC 33.8  30.0 - 36.0 (g/dL)    RDW 82.9  56.2 - 13.0 (%)    Platelets 375  150 - 400 (K/uL)  DIFFERENTIAL     Status: Normal   Collection Time   08/25/11 12:23 PM      Component Value Range Comment   Neutrophils Relative 73  43 - 77 (%)    Neutro Abs 7.7  1.7 - 7.7 (K/uL)    Lymphocytes Relative 22  12 - 46 (%)    Lymphs Abs 2.3  0.7 - 4.0 (K/uL)    Monocytes Relative 5  3 - 12 (%)    Monocytes Absolute 0.5  0.1 - 1.0 (K/uL)    Eosinophils Relative 1  0 - 5 (%)    Eosinophils Absolute 0.1  0.0 - 0.7 (K/uL)    Basophils Relative 0  0 - 1 (%)    Basophils Absolute 0.0  0.0 - 0.1 (K/uL)   COMPREHENSIVE METABOLIC PANEL     Status: Abnormal   Collection Time   08/25/11 12:23 PM      Component Value Range Comment   Sodium 134 (*) 135 - 145 (mEq/L)    Potassium 4.8  3.5 - 5.1 (mEq/L)    Chloride 102  96 - 112 (mEq/L)    CO2 22  19 - 32 (mEq/L)    Glucose, Bld 97  70 - 99 (mg/dL)    BUN 26 (*) 6 - 23 (mg/dL)    Creatinine, Ser 0.98  0.50 - 1.35 (mg/dL)    Calcium 11.9  8.4 - 10.5 (mg/dL)    Total Protein 8.0  6.0 - 8.3 (g/dL)    Albumin 3.4 (*) 3.5 - 5.2 (g/dL)    AST 16  0 - 37 (U/L)    ALT 14  0 - 53 (U/L)    Alkaline Phosphatase 164 (*) 39 - 117 (U/L)    Total Bilirubin 0.2 (*) 0.3 - 1.2 (mg/dL)    GFR calc non Af Amer 70 (*) >90 (mL/min)    GFR calc Af Amer 81 (*) >90 (mL/min)   URINALYSIS, ROUTINE W REFLEX MICROSCOPIC     Status: Abnormal   Collection Time   08/25/11  2:17 PM      Component Value Range Comment   Color, Urine YELLOW  YELLOW      APPearance CLEAR  CLEAR     Specific Gravity, Urine 1.025  1.005 - 1.030     pH 5.0  5.0 - 8.0     Glucose, UA NEGATIVE  NEGATIVE (mg/dL)    Hgb urine dipstick NEGATIVE  NEGATIVE     Bilirubin Urine SMALL (*) NEGATIVE     Ketones, ur NEGATIVE  NEGATIVE (mg/dL)    Protein, ur NEGATIVE  NEGATIVE (mg/dL)    Urobilinogen, UA 1.0  0.0 - 1.0 (mg/dL)    Nitrite NEGATIVE  NEGATIVE     Leukocytes, UA NEGATIVE  NEGATIVE  MICROSCOPIC NOT DONE ON URINES WITH NEGATIVE PROTEIN, BLOOD, LEUKOCYTES, NITRITE, OR GLUCOSE <1000 mg/dL.    Dg Chest 2 View  08/25/2011  *RADIOLOGY REPORT*  Clinical Data: Altered mental status, cough, weakness  CHEST - 2 VIEW  Comparison: 07/04/2011  Findings: Upper normal heart size post CABG. Tortuous aorta. Pulmonary vascularity normal. Question bilateral nipple shadows. Lungs appear emphysematous but clear. No pleural effusion or pneumothorax. No acute osseous findings. Bones appear mildly demineralized.  IMPRESSION: Post CABG. Emphysematous changes with question bilateral nipple shadows; recommend repeat PA chest radiograph with nipple markers to exclude pulmonary nodules.  Original Report Authenticated By: Lollie Marrow, M.D.   Ct Head Wo Contrast (if Head Trauma)  08/25/2011  *RADIOLOGY REPORT*  Clinical Data:  Fall.  Weakness  CT HEAD WITHOUT CONTRAST CT CERVICAL SPINE WITHOUT CONTRAST  Technique:  Multidetector CT imaging of the head and cervical spine was performed following the standard protocol without intravenous contrast.  Multiplanar CT image reconstructions of the cervical spine were also generated.  Comparison:  CT 07/02/2011  CT HEAD  Findings: Generalized atrophy of a moderate degree.  Chronic microvascular ischemia is present in the white matter.  Negative for acute infarct.  Negative for hemorrhage or mass.  No subdural hematoma.  Extensive sinusitis with mucosal edema in the sinuses and an air- fluid level left maxillary sinus.  Negative for skull fracture.   IMPRESSION: Atrophy and chronic ischemic change.  No acute intracranial abnormality.  Sinusitis.  CT CERVICAL SPINE  Findings: Normal alignment.  No fracture of the cervical spine. Mild disc degeneration and spondylosis C3-C7.  Apical emphysema bilaterally.  Carotid atherosclerotic disease.  IMPRESSION: Negative for fracture.  Original Report Authenticated By: Camelia Phenes, M.D.   Ct Cervical Spine Wo Contrast  08/25/2011  *RADIOLOGY REPORT*  Clinical Data:  Fall.  Weakness  CT HEAD WITHOUT CONTRAST CT CERVICAL SPINE WITHOUT CONTRAST  Technique:  Multidetector CT imaging of the head and cervical spine was performed following the standard protocol without intravenous contrast.  Multiplanar CT image reconstructions of the cervical spine were also generated.  Comparison:  CT 07/02/2011  CT HEAD  Findings: Generalized atrophy of a moderate degree.  Chronic microvascular ischemia is present in the white matter.  Negative for acute infarct.  Negative for hemorrhage or mass.  No subdural hematoma.  Extensive sinusitis with mucosal edema in the sinuses and an air- fluid level left maxillary sinus.  Negative for skull fracture.  IMPRESSION: Atrophy and chronic ischemic change.  No acute intracranial abnormality.  Sinusitis.  CT CERVICAL SPINE  Findings: Normal alignment.  No fracture of the cervical spine. Mild disc degeneration and spondylosis C3-C7.  Apical emphysema bilaterally.  Carotid atherosclerotic disease.  IMPRESSION: Negative for fracture.  Original Report Authenticated By: Camelia Phenes, M.D.    Impression:  #1 *Fall appears to be clinically dehydrated, blood pressure was in the 90s and he presented, will assess orthostatic hypotension. Admit to telemetry, we will cycle cardiac enzymes, we will obtain a 2-D echo. A CT of the head was negative. He denies any strokelike symptoms. No gross focal neurologic deficits. Therefore we'll not pursue a stroke workup.  #2 Seizure check a Dilantin level Seizure  precautions    #3 history of Stroke not quite sure why the patient is on Aggrenox and Plavix. This needs to be verified with his pharmacy   #4 Dementia continue outpatient medications  #5 Atrial fibrillation continue his diltiazem, rate controlled, currently not on anticoagulation probably secondary to falls   #6 Failure to thrive obtain a PT OT consultation, may need placement.           Nery Kalisz 08/25/2011, 4:30 PM

## 2011-08-25 NOTE — ED Notes (Addendum)
Pt with generalized weakness, for the last 4 days, had fall this AM. No LOC. Denies pain. Hasn't eaten solid food in 4 days. Family reports pt has only wanted to drink Pepsi. Pt with hx of dementia. Stroke in Nov 2012. Residual left sided weakness per EMS.

## 2011-08-25 NOTE — ED Notes (Signed)
Patient family states he has been in bed x 10 days since he was seen by his doctor and was given medication for a cough and cold. Patient is experiencing general weakness and denies chest pain or general body pain or SOB. Patient resting with NAD at this time. Family remains at bedside.

## 2011-08-26 LAB — CARDIAC PANEL(CRET KIN+CKTOT+MB+TROPI)
CK, MB: 1.5 ng/mL (ref 0.3–4.0)
CK, MB: 1.7 ng/mL (ref 0.3–4.0)
CK, MB: 1.7 ng/mL (ref 0.3–4.0)
Relative Index: INVALID (ref 0.0–2.5)
Total CK: 52 U/L (ref 7–232)
Troponin I: <0.3 ng/mL (ref <0.30)

## 2011-08-26 LAB — COMPREHENSIVE METABOLIC PANEL
ALT: 12 U/L (ref 0–53)
Albumin: 2.9 g/dL — ABNORMAL LOW (ref 3.5–5.2)
Alkaline Phosphatase: 143 U/L — ABNORMAL HIGH (ref 39–117)
Calcium: 8.8 mg/dL (ref 8.4–10.5)
Potassium: 3.9 mEq/L (ref 3.5–5.1)
Sodium: 138 mEq/L (ref 135–145)
Total Protein: 6.9 g/dL (ref 6.0–8.3)

## 2011-08-26 LAB — CBC
HCT: 36.1 % — ABNORMAL LOW (ref 39.0–52.0)
Hemoglobin: 12 g/dL — ABNORMAL LOW (ref 13.0–17.0)
MCHC: 33.2 g/dL (ref 30.0–36.0)
MCV: 89.6 fL (ref 78.0–100.0)
RDW: 13.4 % (ref 11.5–15.5)

## 2011-08-26 LAB — TSH: TSH: 0.844 u[IU]/mL (ref 0.350–4.500)

## 2011-08-26 MED ORDER — FESOTERODINE FUMARATE ER 8 MG PO TB24
8.0000 mg | ORAL_TABLET | Freq: Every day | ORAL | Status: DC
  Administered 2011-08-27: 8 mg via ORAL
  Filled 2011-08-26: qty 1

## 2011-08-26 MED ORDER — GALANTAMINE HYDROBROMIDE ER 16 MG PO CP24
16.0000 mg | ORAL_CAPSULE | Freq: Every day | ORAL | Status: DC
  Filled 2011-08-26 (×2): qty 1

## 2011-08-26 MED ORDER — HALOPERIDOL 0.5 MG PO TABS
0.5000 mg | ORAL_TABLET | Freq: Two times a day (BID) | ORAL | Status: DC | PRN
  Administered 2011-08-27: 0.5 mg via ORAL
  Filled 2011-08-26: qty 1

## 2011-08-26 MED ORDER — GALANTAMINE HYDROBROMIDE 4 MG PO TABS
8.0000 mg | ORAL_TABLET | Freq: Two times a day (BID) | ORAL | Status: DC
  Filled 2011-08-26 (×2): qty 2
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
## 2011-08-26 NOTE — Progress Notes (Signed)
Utilization review complete 

## 2011-08-26 NOTE — Progress Notes (Signed)
Clinical Social Worker completed the psychosocial assessment which can be found in the shadow chart.  CSW spoke with patient at bedside about plans at discharge.  Patient is agreeable to go to SNF at discharge, stating that he had been to a facility in the past.  Patient asked social worker to clarify details with his wife.  Clinical Social Worker contacted patient wife over the phone who states patient had previously been to [facility] but would not like for him to return at discharge.  Patient wife agreeable to new [facility] search.  Clinical Social Worker to initiate new SNF search and follow up with patient and patient wife to discuss bed offers.  Patient with pre-existing PASARR number from previous SNF stay.  Clinical Social Worker available for continued support and to facilitate patient discharge needs.  [address] [place], [place] [phone]
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
## 2011-08-26 NOTE — Progress Notes (Signed)
Subjective: Some confusion this afternoon Objective: Vital signs in last 24 hours: Temp:  [97.4 F (36.3 C)-98.6 F (37 C)] 97.4 F (36.3 C) (01/10 1300) Pulse Rate:  [69-82] 82  (01/10 1300) Resp:  [18] 18  (01/10 1300) BP: (93-123)/(63-84) 93/63 mmHg (01/10 1719) SpO2:  [95 %-99 %] 99 % (01/10 1300)   Intake/Output from previous day: 01/09 0701 - 01/10 0700 In: 1100 [I.V.:1100] Out: 600 [Urine:600] Intake/Output this shift: Total I/O In: 222 [P.O.:222] Out: 550 [Urine:550]    General Appearance:    Alert, cooperative, no distress, appears stated age  Lungs:     Clear to auscultation bilaterally, respirations unlabored   Heart:    Regular rate and rhythm, S1 and S2 normal, no murmur, rub   or gallop  Abdomen:     Soft, non-tender, bowel sounds present    no masses, no organomegaly  Extremities:   Extremities normal, atraumatic, no cyanosis or edema  Neurologic:   CNII-XII intact,nonfocal       Weight change:   Intake/Output Summary (Last 24 hours) at 08/26/11 1856 Last data filed at 08/26/11 1800  Gross per 24 hour  Intake   1322 ml  Output   1150 ml  Net    172 ml    Lab Results:   Basename 08/26/11 0715 08/25/11 2011 08/25/11 1223  NA 138 -- 134*  K 3.9 -- 4.8  CL 106 -- 102  CO2 21 -- 22  GLUCOSE 89 -- 97  BUN 20 -- 26*  CREATININE 0.84 -- 1.09  CALCIUM 8.8 9.2 --    Basename 08/26/11 0715 08/25/11 2011  WBC 10.0 11.6*  HGB 12.0* 13.0  HCT 36.1* 39.3  PLT 315 360  MCV 89.6 90.8   PT/INR No results found for this basename: LABPROT:2,INR:2 in the last 72 hours ABG No results found for this basename: PHART:2,PCO2:2,PO2:2,HCO3:2 in the last 72 hours  Micro Results: Recent Results (from the past 240 hour(s))  URINE CULTURE     Status: Normal   Collection Time   08/25/11  2:20 PM      Component Value Range Status Comment   Specimen Description URINE, CATHETERIZED   Final    Special Requests amox Normal   Final    Setup Time [id number]    Final    Colony Count NO GROWTH   Final    Culture NO GROWTH   Final    Report Status 08/26/2011 FINAL   Final    Studies/Results: Dg Chest 2 View  08/25/2011  *RADIOLOGY REPORT*  Clinical Data: Altered mental status, cough, weakness  CHEST - 2 VIEW  Comparison: 07/04/2011  Findings: Upper normal heart size post CABG. Tortuous aorta. Pulmonary vascularity normal. Question bilateral nipple shadows. Lungs appear emphysematous but clear. No pleural effusion or pneumothorax. No acute osseous findings. Bones appear mildly demineralized.  IMPRESSION: Post CABG. Emphysematous changes with question bilateral nipple shadows; recommend repeat PA chest radiograph with nipple markers to exclude pulmonary nodules.  Original Report Authenticated By: Lollie Marrow, M.D.   Ct Head Wo Contrast (if Head Trauma)  08/25/2011  *RADIOLOGY REPORT*  Clinical Data:  Fall.  Weakness  CT HEAD WITHOUT CONTRAST CT CERVICAL SPINE WITHOUT CONTRAST  Technique:  Multidetector CT imaging of the head and cervical spine was performed following the standard protocol without intravenous contrast.  Multiplanar CT image reconstructions of the cervical spine were also generated.  Comparison:  CT 07/02/2011  CT HEAD  Findings: Generalized atrophy of a moderate degree.  Chronic microvascular ischemia is present in the white matter.  Negative for acute infarct.  Negative for hemorrhage or mass.  No subdural hematoma.  Extensive sinusitis with mucosal edema in the sinuses and an air- fluid level left maxillary sinus.  Negative for skull fracture.  IMPRESSION: Atrophy and chronic ischemic change.  No acute intracranial abnormality.  Sinusitis.  CT CERVICAL SPINE  Findings: Normal alignment.  No fracture of the cervical spine. Mild disc degeneration and spondylosis C3-C7.  Apical emphysema bilaterally.  Carotid atherosclerotic disease.  IMPRESSION: Negative for fracture.  Original Report Authenticated By: Camelia Phenes, M.D.   Ct Cervical Spine Wo  Contrast  08/25/2011  *RADIOLOGY REPORT*  Clinical Data:  Fall.  Weakness  CT HEAD WITHOUT CONTRAST CT CERVICAL SPINE WITHOUT CONTRAST  Technique:  Multidetector CT imaging of the head and cervical spine was performed following the standard protocol without intravenous contrast.  Multiplanar CT image reconstructions of the cervical spine were also generated.  Comparison:  CT 07/02/2011  CT HEAD  Findings: Generalized atrophy of a moderate degree.  Chronic microvascular ischemia is present in the white matter.  Negative for acute infarct.  Negative for hemorrhage or mass.  No subdural hematoma.  Extensive sinusitis with mucosal edema in the sinuses and an air- fluid level left maxillary sinus.  Negative for skull fracture.  IMPRESSION: Atrophy and chronic ischemic change.  No acute intracranial abnormality.  Sinusitis.  CT CERVICAL SPINE  Findings: Normal alignment.  No fracture of the cervical spine. Mild disc degeneration and spondylosis C3-C7.  Apical emphysema bilaterally.  Carotid atherosclerotic disease.  IMPRESSION: Negative for fracture.  Original Report Authenticated By: Camelia Phenes, M.D.   Medications:  Scheduled Meds:   . albuterol  2.5 mg Nebulization BID  . clopidogrel  75 mg Oral Daily  . diltiazem  30 mg Oral Q6H  . dipyridamole-aspirin  1 capsule Oral BID  . enoxaparin  40 mg Subcutaneous Q24H  . Fesoterodine Fumarate  8 mg Oral Daily  . folic acid  1 mg Oral Daily  . galantamine  16 mg Oral Q breakfast  . ipratropium  0.5 mg Nebulization BID  . levETIRAcetam  500 mg Oral Q12H  . memantine  10 mg Oral BID  . pantoprazole  40 mg Oral Q1200  . PHENobarbital  32.4 mg Oral BID  . phenytoin  200 mg Oral BID  . potassium chloride SA  40 mEq Oral Daily  . simvastatin  5 mg Oral q1800  . DISCONTD: albuterol  2.5 mg Nebulization Q6H  . DISCONTD: dipyridamole-aspirin  1 capsule Oral QHS  . DISCONTD: Fesoterodine Fumarate  8 mg Oral Daily  . DISCONTD: galantamine  16 mg Oral Q  breakfast  . DISCONTD: galantamine  8 mg Oral BID WC  . DISCONTD: ipratropium  0.5 mg Nebulization Q6H   Continuous Infusions:   . sodium chloride 1,000 mL (08/26/11 1339)   PRN Meds:.acetaminophen, acetaminophen, albuterol, ondansetron (ZOFRAN) IV, ondansetron Assessment/Plan: Patient Active Hospital Problem List: Fall (08/25/2011) -f/u on echo, seen by PT and recommending SNF    Seizure (07/02/2011) - dilantin level 12.1- therapeutic, no seizures reported overnight  h/o Stroke (07/02/2011) -continue outpt meds  Dementia (07/03/2011) -continue outpt meds, add prn haldol   Atrial fibrillation (07/07/2011) -continue cardizem, rate controlled    Failure to thrive (08/25/2011)    LOS: 1 day   Delinda Malan C 08/26/2011, 6:56 PM

## 2011-08-27 LAB — BASIC METABOLIC PANEL
BUN: 11 mg/dL (ref 6–23)
Chloride: 105 mEq/L (ref 96–112)
GFR calc Af Amer: >90 mL/min (ref >90)
GFR calc non Af Amer: >90 mL/min (ref >90)
Potassium: 3.6 mEq/L (ref 3.5–5.1)
Sodium: 135 mEq/L (ref 135–145)

## 2011-08-27 MED ORDER — FESOTERODINE FUMARATE ER 8 MG PO TB24
8.0000 mg | ORAL_TABLET | Freq: Every day | ORAL | Status: DC
  Administered 2011-08-27 – 2011-08-30 (×4): 8 mg via ORAL

## 2011-08-27 MED ORDER — GALANTAMINE HYDROBROMIDE ER 16 MG PO CP24
16.0000 mg | ORAL_CAPSULE | Freq: Every day | ORAL | Status: DC
  Administered 2011-08-27 – 2011-08-30 (×4): 16 mg via ORAL
  Filled 2011-08-27 (×4): qty 1

## 2011-08-27 NOTE — Progress Notes (Signed)
Speech Language/Pathology Speech Pathology: Dysphagia Treatment Note  Patient was observed with : Pills with water via straw.   Patient was noted to have s/s of aspiration : Yes:  Cough x1  Lung Sounds:  clear Temperature: afebrile  Patient required: Min verbal cues for safe postrue during Po intake. SLP observed RN administering pills with pt taking small straw sips of water. The pt coughed once over taking over 15 pills with 4oz of water. With sips of water alone, no s/s of aspiration were observed though pt did continue to exhibity a moment of oral holding with all boluses before posterior transit.   Clinical Impression: Suspect intermittent premature spillage, especially when attempting to take pills likely due to age related decreased sensation for primary trigger site. However, Pt with strong cough and clear lungs. Do not feel pt with high aspiration risk. Would recommend pt continue current diet and thin liquids. SLP will f/u for continued tolerance.    Pain:   none Intervention Required:   No   Goals: Goals Partially Met

## 2011-08-27 NOTE — Progress Notes (Signed)
CSW spoke to pt's spouse and provided bed offers.  Only one offer for SNF currently, pt's spouse asked CSW to send info to [facility] facilities again to review for admission.  CSW continues to follow.

## 2011-08-27 NOTE — Progress Notes (Signed)
Utilization review complete 

## 2011-08-27 NOTE — ED Provider Notes (Signed)
Evaluation and management procedures were performed by the PA/NP under my supervision/collaboration.    Felisa Bonier, MD 08/27/11 1719

## 2011-08-27 NOTE — Progress Notes (Signed)
Subjective: Pt sitting up in chair, alert and less confused today, family/wife at bedside Objective: Vital signs in last 24 hours: Temp:  [97 F (36.1 C)-98 F (36.7 C)] 97 F (36.1 C) (01/11 1412) Pulse Rate:  [71-86] 84  (01/11 1412) Resp:  [18-20] 18  (01/11 1412) BP: (100-120)/(67-83) 100/67 mmHg (01/11 1822) SpO2:  [96 %-100 %] 98 % (01/11 1412) Last BM Date: 08/26/11 Intake/Output from previous day: 01/10 0701 - 01/11 0700 In: 1062 [P.O.:1062] Out: 1150 [Urine:1150] Intake/Output this shift:      General Appearance:    Alert, cooperative, no distress, appears stated age  Lungs:     Clear to auscultation bilaterally, respirations unlabored   Heart:    Regular rate and rhythm, S1 and S2 normal, no murmur, rub   or gallop  Abdomen:     Soft, non-tender, bowel sounds present    no masses, no organomegaly  Extremities:   Extremities normal, atraumatic, no cyanosis or edema  Neurologic:   CNII-XII intact,nonfocal       Weight change:   Intake/Output Summary (Last 24 hours) at 08/27/11 1926 Last data filed at 08/27/11 1841  Gross per 24 hour  Intake   1200 ml  Output   1201 ml  Net     -1 ml    Lab Results:   [facility] 08/27/11 0505 08/26/11 0715  NA 135 138  K 3.6 3.9  CL 105 106  CO2 23 21  GLUCOSE 87 89  BUN 11 20  CREATININE 0.82 0.84  CALCIUM 8.9 8.8    Basename 08/26/11 0715 08/25/11 2011  WBC 10.0 11.6*  HGB 12.0* 13.0  HCT 36.1* 39.3  PLT 315 360  MCV 89.6 90.8   PT/INR No results found for this basename: LABPROT:2,INR:2 in the last 72 hours ABG No results found for this basename: PHART:2,PCO2:2,PO2:2,HCO3:2 in the last 72 hours  Micro Results: Recent Results (from the past 240 hour(s))  URINE CULTURE     Status: Normal   Collection Time   08/25/11  2:20 PM      Component Value Range Status Comment   Specimen Description URINE, CATHETERIZED   Final    Special Requests amox Normal   Final    Setup Time [id number]   Final    Colony  Count NO GROWTH   Final    Culture NO GROWTH   Final    Report Status 08/26/2011 FINAL   Final    Studies/Results: No results found. Medications:  Scheduled Meds:    . albuterol  2.5 mg Nebulization BID  . clopidogrel  75 mg Oral Daily  . diltiazem  30 mg Oral Q6H  . dipyridamole-aspirin  1 capsule Oral BID  . enoxaparin  40 mg Subcutaneous Q24H  . Fesoterodine Fumarate  8 mg Oral Daily  . folic acid  1 mg Oral Daily  . galantamine  16 mg Oral Q breakfast  . ipratropium  0.5 mg Nebulization BID  . levETIRAcetam  500 mg Oral Q12H  . memantine  10 mg Oral BID  . pantoprazole  40 mg Oral Q1200  . PHENobarbital  32.4 mg Oral BID  . phenytoin  200 mg Oral BID  . potassium chloride SA  40 mEq Oral Daily  . simvastatin  5 mg Oral q1800  . DISCONTD: Fesoterodine Fumarate  8 mg Oral Daily  . DISCONTD: galantamine  16 mg Oral Q breakfast   Continuous Infusions:    . sodium chloride 1,000 mL (08/26/11 1339)  PRN Meds:.acetaminophen, acetaminophen, albuterol, haloperidol, ondansetron (ZOFRAN) IV, ondansetron Assessment/Plan: Patient Active Hospital Problem List: Fall (08/25/2011) -last echo 11/18 with EF 55-60%,  -awaiting SNFplacement, PT following. tsh and vitB12 wnl   Seizure (07/02/2011) - dilantin level 12.1- therapeutic, no seizures reported overnight  h/o Stroke (07/02/2011) -continue outpt meds  Dementia (07/03/2011) -continue outpt meds, add prn haldol   Atrial fibrillation (07/07/2011) -continue cardizem, rate controlled Failure to thrive (08/25/2011)   Wife at bedside updated    LOS: 2 days   Brecklynn Jian C 08/27/2011, 7:26 PM

## 2011-08-28 NOTE — Progress Notes (Signed)
Subjective:  Doing well today, alert and appropriate Objective: Vital signs in last 24 hours: Temp:  [97.8 F (36.6 C)-98.1 F (36.7 C)] 98.1 F (36.7 C) (01/12 2100) Pulse Rate:  [76-83] 79  (01/12 2100) Resp:  [18-20] 18  (01/12 2100) BP: (106-115)/(70-85) 110/72 mmHg (01/12 2100) SpO2:  [92 %-98 %] 95 % (01/12 2100) Last BM Date: 08/26/11 Intake/Output from previous day: 01/11 0701 - 01/12 0700 In: 840 [P.O.:840] Out: 901 [Urine:900; Stool:1] Intake/Output this shift:      General Appearance:    Alert, cooperative, no distress, appears stated age  Lungs:     Clear to auscultation bilaterally, respirations unlabored   Heart:    Regular rate and rhythm, S1 and S2 normal, no murmur, rub   or gallop  Abdomen:     Soft, non-tender, bowel sounds present    no masses, no organomegaly  Extremities:   Extremities normal, atraumatic, no cyanosis or edema  Neurologic:   CNII-XII intact,nonfocal       Weight change:   Intake/Output Summary (Last 24 hours) at 08/28/11 2211 Last data filed at 08/28/11 1700  Gross per 24 hour  Intake    720 ml  Output   1100 ml  Net   -380 ml    Lab Results:   Basename 08/27/11 0505 08/26/11 0715  NA 135 138  K 3.6 3.9  CL 105 106  CO2 23 21  GLUCOSE 87 89  BUN 11 20  CREATININE 0.82 0.84  CALCIUM 8.9 8.8    Basename 08/26/11 0715  WBC 10.0  HGB 12.0*  HCT 36.1*  PLT 315  MCV 89.6   PT/INR No results found for this basename: LABPROT:2,INR:2 in the last 72 hours ABG No results found for this basename: PHART:2,PCO2:2,PO2:2,HCO3:2 in the last 72 hours  Micro Results: Recent Results (from the past 240 hour(s))  URINE CULTURE     Status: Normal   Collection Time   08/25/11  2:20 PM      Component Value Range Status Comment   Specimen Description URINE, CATHETERIZED   Final    Special Requests amox Normal   Final    Setup Time [id number]   Final    Colony Count NO GROWTH   Final    Culture NO GROWTH   Final    Report  Status 08/26/2011 FINAL   Final    Studies/Results: No results found. Medications:  Scheduled Meds:    . clopidogrel  75 mg Oral Daily  . diltiazem  30 mg Oral Q6H  . dipyridamole-aspirin  1 capsule Oral BID  . enoxaparin  40 mg Subcutaneous Q24H  . Fesoterodine Fumarate  8 mg Oral Daily  . folic acid  1 mg Oral Daily  . galantamine  16 mg Oral Q breakfast  . levETIRAcetam  500 mg Oral Q12H  . memantine  10 mg Oral BID  . pantoprazole  40 mg Oral Q1200  . PHENobarbital  32.4 mg Oral BID  . phenytoin  200 mg Oral BID  . potassium chloride SA  40 mEq Oral Daily  . simvastatin  5 mg Oral q1800  . DISCONTD: albuterol  2.5 mg Nebulization BID  . DISCONTD: ipratropium  0.5 mg Nebulization BID   Continuous Infusions:   PRN Meds:.acetaminophen, acetaminophen, albuterol, haloperidol, ondansetron (ZOFRAN) IV, ondansetron Assessment/Plan: Patient Active Hospital Problem List: Fall (08/25/2011) -last echo 11/18 with EF 55-60%,  -awaiting SNFplacement, PT following. tsh and vitB12 wnl   Seizure (07/02/2011) -continue dilantin,  level  12.1- therapeutic, no seizures reported overnight  h/o Stroke (07/02/2011) -continue outpt meds  Dementia (07/03/2011) -continue outpt meds, add prn haldol   Atrial fibrillation (07/07/2011) -continue cardizem, rate controlled Failure to thrive (08/25/2011)       LOS: 3 days   Dwyane Dupree C 08/28/2011, 10:11 PM

## 2011-08-29 NOTE — Progress Notes (Signed)
Chaplain happened to be passing by patient's room when the patient called out for assistance. Patient stated that he was cold and wanted to get back into bed. Chaplain contacted patient's nurse, Ginger, via the call button and covered patient with an additional blanket until the nurse's arrival. No follow up needed.

## 2011-08-29 NOTE — Progress Notes (Signed)
Subjective:  Looks tired, siiting up in chair. Denies any c/o Objective: Vital signs in last 24 hours: Temp:  [97.8 F (36.6 C)-98.2 F (36.8 C)] 98.2 F (36.8 C) (01/13 0525) Pulse Rate:  [76-79] 77  (01/13 0525) Resp:  [18] 18  (01/13 0525) BP: (106-117)/(72-80) 117/80 mmHg (01/13 0525) SpO2:  [95 %-98 %] 98 % (01/13 0525) Last BM Date: 08/26/11 Intake/Output from previous day: 01/12 0701 - 01/13 0700 In: 960 [P.O.:960] Out: 800 [Urine:800] Intake/Output this shift: Total I/O In: 240 [P.O.:240] Out: 500 [Urine:500]    General Appearance:    Alert, cooperative, no distress  Lungs:     Clear to auscultation bilaterally, respirations unlabored   Heart:    Regular rate and rhythm, S1 and S2 normal, no murmur, rub   or gallop  Abdomen:     Soft, non-tender, bowel sounds present    no masses, no organomegaly  Extremities:   Extremities normal, atraumatic, no cyanosis or edema  Neurologic:   CNII-XII intact,nonfocal       Weight change:   Intake/Output Summary (Last 24 hours) at 08/29/11 1119 Last data filed at 08/29/11 0947  Gross per 24 hour  Intake    960 ml  Output   1300 ml  Net   -340 ml    Lab Results:   Basename 08/27/11 0505  NA 135  K 3.6  CL 105  CO2 23  GLUCOSE 87  BUN 11  CREATININE 0.82  CALCIUM 8.9   No results found for this basename: WBC:2,HGB:2,HCT:2,PLT:2,MCV:2 in the last 72 hours PT/INR No results found for this basename: LABPROT:2,INR:2 in the last 72 hours ABG No results found for this basename: PHART:2,PCO2:2,PO2:2,HCO3:2 in the last 72 hours  Micro Results: Recent Results (from the past 240 hour(s))  URINE CULTURE     Status: Normal   Collection Time   08/25/11  2:20 PM      Component Value Range Status Comment   Specimen Description URINE, CATHETERIZED   Final    Special Requests amox Normal   Final    Setup Time [id number]   Final    Colony Count NO GROWTH   Final    Culture NO GROWTH   Final    Report Status 08/26/2011  FINAL   Final    Studies/Results: No results found. Medications:  Scheduled Meds:    . clopidogrel  75 mg Oral Daily  . diltiazem  30 mg Oral Q6H  . dipyridamole-aspirin  1 capsule Oral BID  . enoxaparin  40 mg Subcutaneous Q24H  . Fesoterodine Fumarate  8 mg Oral Daily  . folic acid  1 mg Oral Daily  . galantamine  16 mg Oral Q breakfast  . levETIRAcetam  500 mg Oral Q12H  . memantine  10 mg Oral BID  . pantoprazole  40 mg Oral Q1200  . PHENobarbital  32.4 mg Oral BID  . phenytoin  200 mg Oral BID  . potassium chloride SA  40 mEq Oral Daily  . simvastatin  5 mg Oral q1800   Continuous Infusions:   PRN Meds:.acetaminophen, acetaminophen, albuterol, haloperidol, ondansetron (ZOFRAN) IV, ondansetron Assessment/Plan: Patient Active Hospital Problem List: Fall (08/25/2011) -last echo 11/18 with EF 55-60%,  -will follow up with SW for SNFplacement, PT following. tsh and vitB12 wnl   Seizure (07/02/2011) -continue dilantin,  level 12.1- therapeutic, no seizures reported overnight  h/o Stroke (07/02/2011) -continue outpt meds  Dementia (07/03/2011) -continue outpt meds, add prn haldol   Atrial fibrillation (07/07/2011) -  continue cardizem, rate controlled Failure to thrive (08/25/2011)       LOS: 4 days   [name] C 08/29/2011, 11:19 AM

## 2011-08-30 LAB — BASIC METABOLIC PANEL
CO2: 21 mEq/L (ref 19–32)
Chloride: 101 mEq/L (ref 96–112)
Creatinine, Ser: 0.71 mg/dL (ref 0.50–1.35)
GFR calc Af Amer: >90 mL/min (ref >90)
Potassium: 3.6 mEq/L (ref 3.5–5.1)
Sodium: 134 mEq/L — ABNORMAL LOW (ref 135–145)

## 2011-08-30 MED ORDER — HALOPERIDOL 0.5 MG PO TABS: 0.5000 mg | ORAL_TABLET | Freq: Two times a day (BID) | ORAL | Status: AC | PRN

## 2011-08-30 NOTE — Progress Notes (Addendum)
Nsg Discharge Note  Admit Date:  08/25/2011 Discharge date: 08/30/2011   [name] to be D/C'd Skilled nursing facility per MD order.  AVS completed.  Copy for chart, and copy for patient signed, and dated. Patient/caregiver able to verbalize understanding.  Discharge Medication:  [name], [name]  [facility] Medication Instructions [id number]   Printed on:08/30/11 1621  Medication Information                    PHENobarbital (LUMINAL) 32.4 MG tablet Take 32.4 mg by mouth 2 (two) times daily.             memantine (NAMENDA) 10 MG tablet Take 10 mg by mouth 2 (two) times daily.             donepezil (ARICEPT) 10 MG tablet Take 10 mg by mouth at bedtime as needed.             levETIRAcetam (KEPPRA) 500 MG tablet Take 500 mg by mouth every 12 (twelve) hours.            Fesoterodine Fumarate 8 MG TB24 Take 8 mg by mouth daily.            folic acid (FOLVITE) 1 MG tablet Take 1 mg by mouth daily.             lisinopril (PRINIVIL,ZESTRIL) 40 MG tablet Take 40 mg by mouth daily.             phenytoin (DILANTIN) 100 MG ER capsule Take 200 mg by mouth 2 (two) times daily.            galantamine (RAZADYNE ER) 16 MG 24 hr capsule Take 16 mg by mouth daily with breakfast.            NIFEdipine (PROCARDIA-XL/ADALAT CC) 60 MG 24 hr tablet Take 60 mg by mouth daily.             dipyridamole-aspirin (AGGRENOX) 25-200 MG per 12 hr capsule Take 1 capsule by mouth at bedtime.           dipyridamole-aspirin (AGGRENOX) 25-200 MG per 12 hr capsule Take 1 capsule by mouth 2 (two) times daily.           diltiazem (CARDIZEM) 30 MG tablet Take 1 tablet (30 mg total) by mouth every 6 (six) hours.           pantoprazole (PROTONIX) 40 MG tablet Take 1 tablet (40 mg total) by mouth daily at 12 noon.           pravastatin (PRAVACHOL) 40 MG tablet Take 40 mg by mouth daily.           haloperidol (HALDOL) 0.5 MG tablet Take 1 tablet (0.5 mg total) by mouth 2 (two) times daily as needed.        Discharge Assessment: Filed Vitals:   08/30/11 1415  BP: 105/69  Pulse: 85  Temp: 98 F (36.7 C)  Resp: 18   Skin clean, dry and intact without evidence of skin break down, no evidence of skin tears noted. IV catheter discontinued intact. Site without signs and symptoms of complications - no redness or edema noted at insertion site, patient denies c/o pain - only slight tenderness at site.  Dressing with slight pressure applied.  D/c Instructions-Education: Discharge packet given to wife to give to SNF.    Patient escorted via WC, and D/C to SNF via private auto.  [name] [name], RN 08/30/2011 4:21 PM

## 2011-08-30 NOTE — Progress Notes (Signed)
Patient for d/c today to SNF bed at [facility]. Patient and wife agreeable to this plan- plan transfer via wife's vehicle. Reece Levy, MSW, Theresia Majors [phone]

## 2011-08-30 NOTE — Discharge Summary (Signed)
Name: [name] MRN: [id number] DOB: September 07, 1945 66 y.o.  Date of Admission: 08/25/2011 11:07 AM Date of Discharge: 08/30/2011 Attending Physician: Kela Millin  Discharge Diagnosis: Principal Problem:  *Fall Active Problems:  Seizure  Stroke  Dementia  Atrial fibrillation  Failure to thrive   Discharge Medications: Current Discharge Medication List    START taking these medications   Details  haloperidol (HALDOL) 0.5 MG tablet Take 1 tablet (0.5 mg total) by mouth 2 (two) times daily as needed. Qty: 60 tablet, Refills: 0      CONTINUE these medications which have NOT CHANGED   Details  diltiazem (CARDIZEM) 30 MG tablet Take 1 tablet (30 mg total) by mouth every 6 (six) hours.    !! dipyridamole-aspirin (AGGRENOX) 25-200 MG per 12 hr capsule Take 1 capsule by mouth at bedtime.    !! dipyridamole-aspirin (AGGRENOX) 25-200 MG per 12 hr capsule Take 1 capsule by mouth 2 (two) times daily.    donepezil (ARICEPT) 10 MG tablet Take 10 mg by mouth at bedtime as needed.      Fesoterodine Fumarate 8 MG TB24 Take 8 mg by mouth daily.     folic acid (FOLVITE) 1 MG tablet Take 1 mg by mouth daily.      galantamine (RAZADYNE ER) 16 MG 24 hr capsule Take 16 mg by mouth daily with breakfast.     levETIRAcetam (KEPPRA) 500 MG tablet Take 500 mg by mouth every 12 (twelve) hours.     lisinopril (PRINIVIL,ZESTRIL) 40 MG tablet Take 40 mg by mouth daily.      memantine (NAMENDA) 10 MG tablet Take 10 mg by mouth 2 (two) times daily.      NIFEdipine (PROCARDIA-XL/ADALAT CC) 60 MG 24 hr tablet Take 60 mg by mouth daily.      pantoprazole (PROTONIX) 40 MG tablet Take 1 tablet (40 mg total) by mouth daily at 12 noon.    PHENobarbital (LUMINAL) 32.4 MG tablet Take 32.4 mg by mouth 2 (two) times daily.      phenytoin (DILANTIN) 100 MG ER capsule Take 200 mg by mouth 2 (two) times daily.     pravastatin (PRAVACHOL) 40 MG tablet Take 40 mg by mouth daily.     !! - Potential  duplicate medications found. Please discuss with provider.    STOP taking these medications     clopidogrel (PLAVIX) 75 MG tablet      hydrochlorothiazide (HYDRODIURIL) 25 MG tablet      potassium chloride SA (K-DUR,KLOR-CON) 20 MEQ tablet         Disposition and follow-up:   [name] was discharged from [facility] in improved/stable condition.    Follow-up Appointments: Discharge Orders    Future Orders Please Complete By Expires   Diet Heart      Increase activity slowly         Consultations:    Procedures Performed:  Dg Chest 2 View  08/25/2011  *RADIOLOGY REPORT*  Clinical Data: Altered mental status, cough, weakness  CHEST - 2 VIEW  Comparison: 07/04/2011  Findings: Upper normal heart size post CABG. Tortuous aorta. Pulmonary vascularity normal. Question bilateral nipple shadows. Lungs appear emphysematous but clear. No pleural effusion or pneumothorax. No acute osseous findings. Bones appear mildly demineralized.  IMPRESSION: Post CABG. Emphysematous changes with question bilateral nipple shadows; recommend repeat PA chest radiograph with nipple markers to exclude pulmonary nodules.  Original Report Authenticated By: Lollie Marrow, M.D.   Ct Head Wo Contrast (if Head  Trauma)  08/25/2011  *RADIOLOGY REPORT*  Clinical Data:  Fall.  Weakness  CT HEAD WITHOUT CONTRAST CT CERVICAL SPINE WITHOUT CONTRAST  Technique:  Multidetector CT imaging of the head and cervical spine was performed following the standard protocol without intravenous contrast.  Multiplanar CT image reconstructions of the cervical spine were also generated.  Comparison:  CT 07/02/2011  CT HEAD  Findings: Generalized atrophy of a moderate degree.  Chronic microvascular ischemia is present in the white matter.  Negative for acute infarct.  Negative for hemorrhage or mass.  No subdural hematoma.  Extensive sinusitis with mucosal edema in the sinuses and an air- fluid level left maxillary sinus.   Negative for skull fracture.  IMPRESSION: Atrophy and chronic ischemic change.  No acute intracranial abnormality.  Sinusitis.  CT CERVICAL SPINE  Findings: Normal alignment.  No fracture of the cervical spine. Mild disc degeneration and spondylosis C3-C7.  Apical emphysema bilaterally.  Carotid atherosclerotic disease.  IMPRESSION: Negative for fracture.  Original Report Authenticated By: Camelia Phenes, M.D.   Ct Cervical Spine Wo Contrast  08/25/2011  *RADIOLOGY REPORT*  Clinical Data:  Fall.  Weakness  CT HEAD WITHOUT CONTRAST CT CERVICAL SPINE WITHOUT CONTRAST  Technique:  Multidetector CT imaging of the head and cervical spine was performed following the standard protocol without intravenous contrast.  Multiplanar CT image reconstructions of the cervical spine were also generated.  Comparison:  CT 07/02/2011  CT HEAD  Findings: Generalized atrophy of a moderate degree.  Chronic microvascular ischemia is present in the white matter.  Negative for acute infarct.  Negative for hemorrhage or mass.  No subdural hematoma.  Extensive sinusitis with mucosal edema in the sinuses and an air- fluid level left maxillary sinus.  Negative for skull fracture.  IMPRESSION: Atrophy and chronic ischemic change.  No acute intracranial abnormality.  Sinusitis.  CT CERVICAL SPINE  Findings: Normal alignment.  No fracture of the cervical spine. Mild disc degeneration and spondylosis C3-C7.  Apical emphysema bilaterally.  Carotid atherosclerotic disease.  IMPRESSION: Negative for fracture.  Original Report Authenticated By: Camelia Phenes, M.D.   Brief history This is a 66 year old male with a recent history of CVA in November, was brought into the ER because of poor appetite, falls, noncompliance with medication use.Patient family states he has been in bed x one week since he was seen by his doctor and was given medication for a cough and cold. Patient is experiencing general weakness only and denies chest pain or general body  pain or SOB. Patient resting with NAD at this time. History is obtained from the patient however the patient is a very poor historian, no family is present at the bedside at this time.  Family member reports patient has had one week of increased weakness, not eating, increased confusion, and a fall this morning. States patient was placed in [facility] this fall following a stroke and seizure. States patient demanded to come home in December and has since barely gotten out of bed. States he has had a cough for several weeks, has been on a z-pak and now amoxicillin for a dental abscess - states cough is getting worse. This week, patient has gotten out of bed several times and urinated on the floor - stating he thought he was outside. States he is not eating or drinking much, only drinking part of a pepsi when he takes his medications. Pt has a hx of alzheimers. Family member denies N/V/D, any complaints of pain, any apparent wheezing  or SOB. Patient denies CP, SOB, abdominal pain, pain anywhere, dysuria. He was admitted for further evaluation/mangement and for placement Physical exam  General Appearance:    Alert, cooperative, no distress  Lungs:     Clear to auscultation bilaterally, respirations unlabored   Heart:    Regular rate and rhythm, S1 and S2 normal, no murmur, rub   or gallop  Abdomen:     Soft, non-tender, bowel sounds active all four quadrants,    no masses, no organomegaly  Extremities:   Extremities normal, atraumatic, no cyanosis or edema  Neurologic:   CNII-XII intact, nonfocal       Hospital Course by problem list: Principal Problem:  *Fall Active Problems:  Seizure  Stroke  Dementia  Atrial fibrillation  Failure to thrive  Falls (08/25/2011)  Upon admission the patient was noted to be clinically dehydrated/volume depleted with a systolic blood pressure in the 90s noted. The patient was hydrated with IV fluids and the hydrochlorothiazide was on was discontinued. Cardiac  enzymes were cycled and came back negative for MI. It was noted that he had had a 2-D echocardiogram done on 07/04/11 and an EF was 55-60% and itwas not repeated. A CT scan of his head was done and came back showing no acute intracranial findings, also cervical spine and CT scan did not show any fractures.Urinalysis was done and came back negative for infection. Also vitamin B 12 level was done and was within normal limits and a TSH also came back normal at 0.844. PT OT was consulted and saw the patient and have recommended a skilled nursing for rehabilitation and the patient and wife are agreeable to returning [facility] patient previously. Seizure (07/02/2011)  -The patient had a Dilantin level and it came back therapeutic level 12.1- therapeutic.he was maintained on his Dilantin while in the hospital and no seizures were reported h/o Stroke (07/02/2011)  -On his last discharge from the hospital following admission for CVA he was sent out on him Aggrenox twice a day and each bedtime , and her Plavix was discontinued but it was noted that patient still had her Plavix and listed up as above his active medications when he came back for this admission. The Plavix again has been discontinued, and he is to continue his other preadmission medications as previously. Dementia (07/03/2011)  -The patient was maintained on his outpatient medications,  Atrial fibrillation (07/07/2011)  -His rate remained controlled in the hospital and he was maintained on her Cardizem which is to continue upon discharge. Failure to thrive (08/25/2011)  -See#1,Workup was done as discussed above.   Discharge Vitals:  BP 111/75  Pulse 80  Temp(Src) 98 F (36.7 C) (Oral)  Resp 17  Ht 5\' 9"  (1.753 m)  Wt 78.336 kg (172 lb 11.2 oz)  BMI 25.50 kg/m2  SpO2 95%  Discharge Labs:  Results for orders placed during the hospital encounter of 08/25/11 (from the past 24 hour(s))  BASIC METABOLIC PANEL     Status: Abnormal    Collection Time   08/30/11  6:15 AM      Component Value Range   Sodium 134 (*) 135 - 145 (mEq/L)   Potassium 3.6  3.5 - 5.1 (mEq/L)   Chloride 101  96 - 112 (mEq/L)   CO2 21  19 - 32 (mEq/L)   Glucose, Bld 104 (*) 70 - 99 (mg/dL)   BUN 12  6 - 23 (mg/dL)   Creatinine, Ser 1.61  0.50 - 1.35 (mg/dL)  Calcium 9.5  8.4 - 10.5 (mg/dL)   GFR calc non Af Amer >90  >90 (mL/min)   GFR calc Af Amer >90  >90 (mL/min)    SignedKela Millin 08/30/2011, 12:11 PM

## 2011-08-31 NOTE — Progress Notes (Signed)
   CARE MANAGEMENT NOTE 08/31/2011  Patient:  [name], [name]   Account Number:  [id number]  Date Initiated:  08/27/2011  Documentation initiated by:  Donn Pierini  Subjective/Objective Assessment:   Pt admitted with falls at home, hx of CVA     Action/Plan:   PTA pt lived at home-  PT/OT evals   Anticipated DC Date:  08/30/2011   Anticipated DC Plan:  SKILLED NURSING FACILITY  In-house referral  Clinical Social Worker      DC Planning Services  CM consult      Choice offered to / List presented to:             Status of service:  Completed, signed off Medicare Important Message given?   (If response is "NO", the following Medicare IM given date fields will be blank) Date Medicare IM given:   Date Additional Medicare IM given:    Discharge Disposition:  SKILLED NURSING FACILITY  Per UR Regulation:    Comments:  08/31/11 9:45 Letha Cape RN,BSN [phone] pt dc to snf.  08/27/11- 1625- Donn Pierini RN, BSN [phone] Pt had gone to SNF for rehab following CVA in Nov. returned home before finishing rehab and has been falling. PT/OT evals recommending SNF. CSW consulted for possible placement.

## 2011-11-27 ENCOUNTER — Other Ambulatory Visit: Payer: Self-pay | Admitting: Family Medicine

## 2011-11-29 NOTE — Telephone Encounter (Signed)
Is this ok?

## 2012-01-05 ENCOUNTER — Other Ambulatory Visit: Payer: Self-pay | Admitting: Family Medicine

## 2012-01-05 DIAGNOSIS — M79604 Pain in right leg: Secondary | ICD-10-CM

## 2012-01-07 ENCOUNTER — Ambulatory Visit
Admission: RE | Admit: 2012-01-07 | Discharge: 2012-01-07 | Disposition: A | Discharge status: Home / Self Care | Payer: Medicare Other | Source: Ambulatory Visit | Attending: Family Medicine | Admitting: Family Medicine

## 2012-01-07 DIAGNOSIS — M79605 Pain in left leg: Secondary | ICD-10-CM

## 2012-11-29 ENCOUNTER — Other Ambulatory Visit: Payer: Self-pay | Admitting: Family Medicine

## 2012-11-29 NOTE — Telephone Encounter (Signed)
IS THIS OK 

## 2012-12-05 ENCOUNTER — Telehealth: Payer: Self-pay

## 2012-12-05 DIAGNOSIS — M8580 Other specified disorders of bone density and structure, unspecified site: Secondary | ICD-10-CM | POA: Insufficient documentation

## 2012-12-05 DIAGNOSIS — R270 Ataxia, unspecified: Secondary | ICD-10-CM | POA: Insufficient documentation

## 2012-12-05 DIAGNOSIS — G609 Hereditary and idiopathic neuropathy, unspecified: Secondary | ICD-10-CM | POA: Insufficient documentation

## 2012-12-05 MED ORDER — OLANZAPINE 2.5 MG PO TABS: 2.5000 mg | ORAL_TABLET | Freq: Every day | ORAL | Status: DC

## 2012-12-05 NOTE — Telephone Encounter (Signed)
[name] Patients wants to speak to her only. [phone]. Patients states that patient is taking Seroquel as directed but its not helping. Patient is keeping her up every night not falling a lseep until  4 or 5 am . I would like my husband to get back on Zyprexa this rx really helped.

## 2012-12-05 NOTE — Telephone Encounter (Signed)
I have called [name],  Will stop seroquel, and call in Zyprexa.

## 2012-12-19 ENCOUNTER — Telehealth: Payer: Self-pay | Admitting: Family Medicine

## 2012-12-19 ENCOUNTER — Telehealth: Payer: Self-pay | Admitting: *Deleted

## 2012-12-19 NOTE — Telephone Encounter (Signed)
Left message for patient

## 2012-12-19 NOTE — Telephone Encounter (Signed)
LM

## 2012-12-21 ENCOUNTER — Telehealth: Payer: Self-pay | Admitting: *Deleted

## 2012-12-21 NOTE — Telephone Encounter (Signed)
R/S appointment to 04/13/2013

## 2013-01-11 ENCOUNTER — Encounter (HOSPITAL_COMMUNITY): Payer: Self-pay | Admitting: *Deleted

## 2013-01-11 ENCOUNTER — Emergency Department (HOSPITAL_COMMUNITY)
Admission: EM | Admit: 2013-01-11 | Discharge: 2013-01-12 | Disposition: A | Discharge status: Home / Self Care | Payer: Medicare Other | Attending: Emergency Medicine | Admitting: Emergency Medicine

## 2013-01-11 ENCOUNTER — Emergency Department (HOSPITAL_COMMUNITY): Payer: Medicare Other

## 2013-01-11 DIAGNOSIS — I10 Essential (primary) hypertension: Secondary | ICD-10-CM | POA: Insufficient documentation

## 2013-01-11 DIAGNOSIS — R55 Syncope and collapse: Secondary | ICD-10-CM

## 2013-01-11 DIAGNOSIS — I252 Old myocardial infarction: Secondary | ICD-10-CM | POA: Insufficient documentation

## 2013-01-11 DIAGNOSIS — Z951 Presence of aortocoronary bypass graft: Secondary | ICD-10-CM | POA: Insufficient documentation

## 2013-01-11 DIAGNOSIS — G40909 Epilepsy, unspecified, not intractable, without status epilepticus: Secondary | ICD-10-CM | POA: Insufficient documentation

## 2013-01-11 DIAGNOSIS — Z87891 Personal history of nicotine dependence: Secondary | ICD-10-CM | POA: Insufficient documentation

## 2013-01-11 DIAGNOSIS — Z8673 Personal history of transient ischemic attack (TIA), and cerebral infarction without residual deficits: Secondary | ICD-10-CM | POA: Insufficient documentation

## 2013-01-11 DIAGNOSIS — Z79899 Other long term (current) drug therapy: Secondary | ICD-10-CM | POA: Insufficient documentation

## 2013-01-11 DIAGNOSIS — Z87828 Personal history of other (healed) physical injury and trauma: Secondary | ICD-10-CM | POA: Insufficient documentation

## 2013-01-11 DIAGNOSIS — F0390 Unspecified dementia without behavioral disturbance: Secondary | ICD-10-CM
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
## 2013-01-12 LAB — URINALYSIS, ROUTINE W REFLEX MICROSCOPIC
Leukocytes, UA: NEGATIVE
Protein, ur: NEGATIVE mg/dL
Urobilinogen, UA: 0.2 mg/dL (ref 0.0–1.0)

## 2013-01-12 LAB — COMPREHENSIVE METABOLIC PANEL
ALT: 9 U/L (ref 0–53)
Alkaline Phosphatase: 125 U/L — ABNORMAL HIGH (ref 39–117)
CO2: 27 mEq/L (ref 19–32)
Calcium: 9.9 mg/dL (ref 8.4–10.5)
GFR calc Af Amer: 85 mL/min — ABNORMAL LOW (ref >90)
GFR calc non Af Amer: 73 mL/min — ABNORMAL LOW (ref >90)
Glucose, Bld: 86 mg/dL (ref 70–99)
Sodium: 139 mEq/L (ref 135–145)

## 2013-01-12 LAB — CBC
Hemoglobin: 14.2 g/dL (ref 13.0–17.0)
MCHC: 33 g/dL (ref 30.0–36.0)
RDW: 14.1 % (ref 11.5–15.5)

## 2013-01-12 LAB — ETHANOL: Alcohol, Ethyl (B): <11 mg/dL (ref 0–11)

## 2013-01-12 NOTE — ED Notes (Signed)
Pt dc to home. Pt sts understanding to dc instructions. Pt taken to car via w/c. nadn. 

## 2013-03-29 ENCOUNTER — Ambulatory Visit: Payer: Self-pay | Admitting: Neurology

## 2013-04-12 ENCOUNTER — Ambulatory Visit: Payer: Self-pay | Admitting: Neurology

## 2013-04-13 ENCOUNTER — Encounter: Payer: Self-pay | Admitting: Neurology

## 2013-04-13 ENCOUNTER — Ambulatory Visit (INDEPENDENT_AMBULATORY_CARE_PROVIDER_SITE_OTHER): Payer: Medicare Other | Admitting: Neurology

## 2013-04-13 VITALS — BP 92/66 | HR 80 | Ht 69.0 in | Wt 170.0 lb

## 2013-04-13 DIAGNOSIS — G40309 Generalized idiopathic epilepsy and epileptic syndromes, not intractable, without status epilepticus: Secondary | ICD-10-CM

## 2013-04-13 DIAGNOSIS — E538 Deficiency of other specified B group vitamins: Secondary | ICD-10-CM

## 2013-04-13 DIAGNOSIS — I6350 Cerebral infarction due to unspecified occlusion or stenosis of unspecified cerebral artery: Secondary | ICD-10-CM

## 2013-04-13 DIAGNOSIS — R413 Other amnesia: Secondary | ICD-10-CM

## 2013-04-13 DIAGNOSIS — I10 Essential (primary) hypertension: Secondary | ICD-10-CM | POA: Insufficient documentation

## 2013-04-13 DIAGNOSIS — I639 Cerebral infarction, unspecified: Secondary | ICD-10-CM

## 2013-04-13 DIAGNOSIS — I4891 Unspecified atrial fibrillation: Secondary | ICD-10-CM

## 2013-04-13 DIAGNOSIS — F0390 Unspecified dementia without behavioral disturbance: Secondary | ICD-10-CM

## 2013-04-13 DIAGNOSIS — M899 Disorder of bone, unspecified: Secondary | ICD-10-CM

## 2013-04-13 DIAGNOSIS — IMO0002 Reserved for concepts with insufficient information to code with codable children: Secondary | ICD-10-CM

## 2013-04-13 MED ORDER — QUETIAPINE FUMARATE 50 MG PO TABS: 50.0000 mg | ORAL_TABLET | Freq: Every day | ORAL | Status: DC

## 2013-04-13 NOTE — Progress Notes (Signed)
History of Present Illness:  [name] is a 67 year-old right-handed married African American male follow up for dementia, gait difficulty, stroke. Last visit was Feb 2014  He has PMHx of 5 vessel coronary artery bypass surgery in 09/2000, complex partial seizure and secondarily generalized major motor seizures, bicerebral  small vessel ischemic disease on MRI, right brain posterior limb of the internal capsule stroke 04/1994, recurrent left-sided numbness with stroke 1999, and progressive memory loss.   He was admitted to [facility] 12/17/2011with a seizure and ataxia. MRI/MRA of the brain did not show a stroke. EEG was normal. Phenobarbital level 23, phenytoin level 5.6 ,and raised  to 17.2. 10/01/10  His interest is watching TV. When he has a seizure he has a dazed look. There is no aura. He does not drive a car.  He has no family history of memory loss. He has never been an alcohol drinker.He has been on Namenda and galantamine since 2008.  His wife reports that he has been falling, confused, and has urinary incontinence. He will not wear depends.   He denies numbness in his feet, pain in his neck to his arms, or pain in  his back to his legs. He cannot walk without a cane or walker and  has had multiple falls. He refuses to use his walker. He sleeps during the day.  He denies weakness in his legs or arms. He is not having headaches. He denies depression.  He was admitted to [facility] in 07/03/11 for mental status changes and had a seizure in the ER. According to wife he had not been taking his Dilantin as ordered. MRI of the brain with acute infarct right frontal cortex and advanced atrophy, chronic small vessel disease.  Carotid dopplers showed no significant  ICA stenosis. 2 D echo showed no source of embolus. No further seizure activity during admission.  He went to skilled facility for rehab. Readmitted to [facility] with dehydration, increased weakness, poor po intake and multiple falls at home on 08/25/11. Pt has rolling  walker but will not use it per wife.    He was sent home the next day with home therapies which he fired.  Due to his gait  Abnormality, we have been trying to get pt off Dilantin. Keppra was instituted   UPDATE August 2014:  I have started him on Seroquel 25mg  qhs, which did not help him sleep much.  He continues to have significant memory trouble, agitated easily, urinary incontinence, profound gait difficulty, but refused to use his cane, he had frequent arguments with his wife, he has difficulty sleeping, he drinks Pepcid 6-8 cans each day, complains of depression, he is going to be evaluated by a psychologist in September 8th    Review of Systems  Out of a complete 14 system review, the patient complains of only the following symptoms, and all other reviewed systems are negative.  Neurological: Memory loss, Confusion, insomnia, sleepiness, snoring,  Sleep: Sleepiness   Psychiatric: Not enough sleep, changing appetite      Social History  Patient lives at home with his wife.  He  retired in 1999 from [employer].  He has a 12th grade education.  He has one child.  He quit smoking in 2005.  Denies ever usingr illicit drugs.  He consumes alcohol once a year.   He consumes caffeine everyday..   Inhaled Tobacco Use: former smoker  Family History  His mother died at age 71.  His father died at age 19.  Family  history of seizures.    Past Medical History  Positive for right brain lacunar strokes in 1995 and 1999. Hypertension. Complex partial and secondarily generalized seizures. Coronary artery disease status post 5 vessel coronary artery bypass surgery. Osteopenia by DEXA scan  11/12-MRI of the brain with acute infarct right frontal cortex and advanced atrophy.  Surgical History   5 vessel coronary bypass in 2003, back right lumbar 2005  Physical Exam  Neck: supple no carotid bruits Respiratory: clear to auscultation bilaterally Cardiovascular: regular rate rhythm  Neurologic Exam   Mental Status: tired looking elderly male, awake, alert, cooperative to history, talking, and casual conversation. poor dentation, MMSE 23/30, he is not oriented to year and place, clinic name, he has difficulty spell WORLD backwards Cranial Nerves: CN II-XII pupils were equal round reactive to light.    Extraocular movements were full.  Visual fields were full on confrontational test.  Facial sensation and strength were normal.  Hearing was intact to finger rubbing bilaterally.  Uvula tongue were midline.  Head turning and shoulder shrugging were normal and symmetric.  Tongue protrusion into the cheeks strength were normal.  Motor: mild left arm and leg weakness, 5-/5. Sensory: length dependent decreased to  light touch, pinprick,  and vibratory sensation at toes. Coordination: Normal finger-to-nose, heel-to-shin.    Gait and Station: need to push up from seated position, wide based, cautious, unsteady gait.  Romberg sign: positive Reflexes: Deep tendon reflexes: Biceps: 2/2, Brachioradialis: 2/2, Triceps: 2/2, Pateller: 1/1, Achilles: 0/0.  Plantar responses are flexor.  Assessment and Plan:  Problems List:     67 yo AAM with PMhx of stroke. MRI of the brain showed advanced atrophy and severe chronic small vessel disease.  He is on Aggrenox. Long standing history of seizure, is taking Keppra 500mg  bid,  also PB 32.4 mg qid, last seizure was Nov 2012.  He complains of worsening gait difficulty, memory loss agitation    1.  His memory loss could be a combination of central nervous system degenerative disorder, in combination of vascular component, medicine side effect, 2, keep Keppra 500 mg twice a day, phenobarbital 32.4 mg 4 times a day 3. Increase Seroquel to 50mg  qhs. 4. RTC with Eber Jones in 6 months

## 2013-05-14 ENCOUNTER — Telehealth: Payer: Self-pay | Admitting: Neurology

## 2013-05-14 DIAGNOSIS — I10 Essential (primary) hypertension: Secondary | ICD-10-CM

## 2013-05-14 DIAGNOSIS — E785 Hyperlipidemia, unspecified: Secondary | ICD-10-CM

## 2013-05-14 DIAGNOSIS — I639 Cerebral infarction, unspecified: Secondary | ICD-10-CM

## 2013-05-14 DIAGNOSIS — I4891 Unspecified atrial fibrillation: Secondary | ICD-10-CM

## 2013-05-14 DIAGNOSIS — R569 Unspecified convulsions: Secondary | ICD-10-CM

## 2013-05-15 NOTE — Telephone Encounter (Signed)
Spoke to wife and told her the referral was made to psychiatrist, Dr. Donell Beers.   If she doesn't hear from them she will let us know.

## 2013-05-15 NOTE — Telephone Encounter (Signed)
I have seen them in August 2014, please call, I will refer him to psychiatrist. Dr. Donell Beers, Earvin Hansen.
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
## 2013-05-18 ENCOUNTER — Encounter (HOSPITAL_COMMUNITY): Payer: Self-pay | Admitting: Emergency Medicine

## 2013-05-18 ENCOUNTER — Emergency Department (HOSPITAL_COMMUNITY): Payer: Medicare Other

## 2013-05-18 ENCOUNTER — Emergency Department (HOSPITAL_COMMUNITY)
Admission: EM | Admit: 2013-05-18 | Discharge: 2013-05-19 | Disposition: A | Discharge status: Home / Self Care | Payer: Medicare Other | Attending: Emergency Medicine | Admitting: Emergency Medicine

## 2013-05-18 DIAGNOSIS — R4689 Other symptoms and signs involving appearance and behavior: Secondary | ICD-10-CM

## 2013-05-18 DIAGNOSIS — F0390 Unspecified dementia without behavioral disturbance: Secondary | ICD-10-CM

## 2013-05-18 DIAGNOSIS — Z87891 Personal history of nicotine dependence: Secondary | ICD-10-CM | POA: Insufficient documentation

## 2013-05-18 DIAGNOSIS — Z8639 Personal history of other endocrine, nutritional and metabolic disease: Secondary | ICD-10-CM | POA: Insufficient documentation

## 2013-05-18 DIAGNOSIS — R45850 Homicidal ideations: Secondary | ICD-10-CM | POA: Insufficient documentation

## 2013-05-18 DIAGNOSIS — Z951 Presence of aortocoronary bypass graft: Secondary | ICD-10-CM | POA: Insufficient documentation

## 2013-05-18 DIAGNOSIS — I25810 Atherosclerosis of coronary artery bypass graft(s) without angina pectoris: Secondary | ICD-10-CM | POA: Insufficient documentation

## 2013-05-18 DIAGNOSIS — Z8669 Personal history of other diseases of the nervous system and sense organs: Secondary | ICD-10-CM | POA: Insufficient documentation

## 2013-05-18 DIAGNOSIS — Z8673 Personal history of transient ischemic attack (TIA), and cerebral infarction without residual deficits: Secondary | ICD-10-CM | POA: Insufficient documentation

## 2013-05-18 DIAGNOSIS — Z79899 Other long term (current) drug therapy: Secondary | ICD-10-CM | POA: Insufficient documentation

## 2013-05-18 DIAGNOSIS — I499 Cardiac arrhythmia, unspecified: Secondary | ICD-10-CM | POA: Insufficient documentation

## 2013-05-18 DIAGNOSIS — F603 Borderline personality disorder: Secondary | ICD-10-CM | POA: Insufficient documentation

## 2013-05-18 DIAGNOSIS — G40909 Epilepsy, unspecified, not intractable, without status epilepticus: Secondary | ICD-10-CM | POA: Insufficient documentation

## 2013-05-18 DIAGNOSIS — Z862 Personal history of diseases of the blood and blood-forming organs and certain disorders involving the immune mechanism: Secondary | ICD-10-CM | POA: Insufficient documentation

## 2013-05-18 DIAGNOSIS — I252 Old myocardial infarction: Secondary | ICD-10-CM | POA: Insufficient documentation

## 2013-05-18 DIAGNOSIS — I10 Essential (primary) hypertension: Secondary | ICD-10-CM | POA: Insufficient documentation

## 2013-05-18 DIAGNOSIS — Z8739 Personal history of other diseases of the musculoskeletal system and connective tissue: Secondary | ICD-10-CM | POA: Insufficient documentation

## 2013-05-18 DIAGNOSIS — R45851 Suicidal ideations: Secondary | ICD-10-CM | POA: Insufficient documentation

## 2013-05-18 LAB — CBC WITH DIFFERENTIAL/PLATELET
Basophils Absolute: 0 10*3/uL (ref 0.0–0.1)
Basophils Relative: 0 % (ref 0–1)
Eosinophils Absolute: 0.1 10*3/uL (ref 0.0–0.7)
Eosinophils Relative: 1 % (ref 0–5)
Lymphs Abs: 2.5 10*3/uL (ref 0.7–4.0)
MCH: 31.1 pg (ref 26.0–34.0)
MCHC: 34.1 g/dL (ref 30.0–36.0)
MCV: 91.2 fL (ref 78.0–100.0)
Monocytes Absolute: 0.7 10*3/uL (ref 0.1–1.0)
Platelets: 198 10*3/uL (ref 150–400)
RDW: 13.9 % (ref 11.5–15.5)

## 2013-05-18 LAB — BASIC METABOLIC PANEL
Calcium: 9.9 mg/dL (ref 8.4–10.5)
Creatinine, Ser: 1.17 mg/dL (ref 0.50–1.35)
GFR calc Af Amer: 74 mL/min — ABNORMAL LOW (ref >90)
GFR calc non Af Amer: 64 mL/min — ABNORMAL LOW (ref >90)
Glucose, Bld: 119 mg/dL — ABNORMAL HIGH (ref 70–99)
Potassium: 3.3 mEq/L — ABNORMAL LOW (ref 3.5–5.1)
Sodium: 138 mEq/L (ref 135–145)

## 2013-05-18 LAB — RAPID URINE DRUG SCREEN, HOSP PERFORMED
Amphetamines: NOT DETECTED
Cocaine: NOT DETECTED
Opiates: NOT DETECTED
Tetrahydrocannabinol: NOT DETECTED

## 2013-05-18 LAB — ETHANOL: Alcohol, Ethyl (B): <11 mg/dL (ref 0–11)

## 2013-05-18 MED ORDER — GALANTAMINE HYDROBROMIDE 4 MG PO TABS
8.0000 mg | ORAL_TABLET | Freq: Two times a day (BID) | ORAL | Status: DC
  Administered 2013-05-18 – 2013-05-19 (×3): 8 mg via ORAL
  Filled 2013-05-18 (×8): qty 2

## 2013-05-18 MED ORDER — ASPIRIN-DIPYRIDAMOLE ER 25-200 MG PO CP12
1.0000 | ORAL_CAPSULE | Freq: Two times a day (BID) | ORAL | Status: DC
  Administered 2013-05-18 – 2013-05-19 (×3): 1 via ORAL
  Filled 2013-05-18 (×5): qty 1

## 2013-05-18 MED ORDER — FOLIC ACID 1 MG PO TABS
1.0000 mg | ORAL_TABLET | Freq: Every day | ORAL | Status: DC
  Administered 2013-05-18 – 2013-05-19 (×2): 1 mg via ORAL
  Filled 2013-05-18 (×2): qty 1

## 2013-05-18 MED ORDER — PHENOBARBITAL 32.4 MG PO TABS
32.4000 mg | ORAL_TABLET | Freq: Two times a day (BID) | ORAL | Status: DC
  Administered 2013-05-18 – 2013-05-19 (×3): 32.4 mg via ORAL
  Filled 2013-05-18 (×3): qty 1

## 2013-05-18 MED ORDER — MEMANTINE HCL 10 MG PO TABS
10.0000 mg | ORAL_TABLET | Freq: Two times a day (BID) | ORAL | Status: DC
  Administered 2013-05-18 – 2013-05-19 (×3): 10 mg via ORAL
  Filled 2013-05-18 (×5): qty 1

## 2013-05-18 MED ORDER — PANTOPRAZOLE SODIUM 40 MG PO TBEC
40.0000 mg | DELAYED_RELEASE_TABLET | Freq: Every day | ORAL | Status: DC
  Administered 2013-05-18 – 2013-05-19 (×2): 40 mg via ORAL
  Filled 2013-05-18 (×2): qty 1

## 2013-05-18 MED ORDER — LISINOPRIL 40 MG PO TABS
40.0000 mg | ORAL_TABLET | Freq: Every day | ORAL | Status: DC
  Administered 2013-05-18 – 2013-05-19 (×2): 40 mg via ORAL
  Filled 2013-05-18 (×2): qty 1

## 2013-05-18 MED ORDER — HYDROCHLOROTHIAZIDE 25 MG PO TABS
25.0000 mg | ORAL_TABLET | Freq: Every day | ORAL | Status: DC
  Administered 2013-05-18 – 2013-05-19 (×2): 25 mg via ORAL
  Filled 2013-05-18 (×2): qty 1

## 2013-05-18 MED ORDER — ATORVASTATIN CALCIUM 40 MG PO TABS
40.0000 mg | ORAL_TABLET | Freq: Every day | ORAL | Status: DC
  Administered 2013-05-18: 40 mg via ORAL
  Filled 2013-05-18 (×3): qty 1

## 2013-05-18 MED ORDER — QUETIAPINE FUMARATE 50 MG PO TABS
50.0000 mg | ORAL_TABLET | Freq: Every day | ORAL | Status: DC
  Administered 2013-05-18: 50 mg via ORAL
  Filled 2013-05-18: qty 1

## 2013-05-18 MED ORDER — LEVETIRACETAM 500 MG PO TABS
500.0000 mg | ORAL_TABLET | Freq: Two times a day (BID) | ORAL | Status: DC
  Administered 2013-05-18 – 2013-05-19 (×3): 500 mg via ORAL
  Filled 2013-05-18 (×6): qty 1

## 2013-05-18 MED ORDER — NIFEDIPINE ER 60 MG PO TB24
60.0000 mg | ORAL_TABLET | Freq: Every day | ORAL | Status: DC
  Administered 2013-05-18 – 2013-05-19 (×2): 60 mg via ORAL
  Filled 2013-05-18 (×2): qty 1

## 2013-05-18 NOTE — ED Notes (Signed)
Patient transported to CT 

## 2013-05-18 NOTE — ED Notes (Signed)
Assisted pt to the bathroom

## 2013-05-18 NOTE — BH Assessment (Signed)
[facility] Assessment Progress Note Tele assessment appt made for 0900.  Informed Stacy at [facility] who will inform pt's nurse.
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
## 2013-05-18 NOTE — Progress Notes (Signed)
Patient ID: [name], male   DOB: 1946-02-09, 67 y.o.   MRN: [id number] Patient was seen on rounds with Dr Ladona Ridgel.  Patient is alert and answers questions correctly.  Patient however is confused at a times and stated that he will be moving to his sister's house after treatment.  He states his marriage is not working well and that he does not have good relationship with his wife.  He agrees with the plan of care [facility].  He denies SI/HI/AVH. Patient was calm and cooperative during this interview. Dahlia Byes  PMHNP-BC
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
## 2013-05-18 NOTE — ED Notes (Signed)
Telepsych done at bedside.
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
## 2013-05-18 NOTE — ED Notes (Signed)
Wife went home  Verified contact numbers in chart

## 2013-05-18 NOTE — BH Assessment (Signed)
[facility] Assessment Progress Note Update:  Called [facility] and left message @ 1154.  [facility] @ 1155 and no geri beds per [employer].  Called [facility] and per [place] @ 1157, no beds.  Called [facility] and left message @ 1156.  Dereck Ligas, and per [place] @ 1158, beds available.  Referral faxed for review.

## 2013-05-19 DIAGNOSIS — F0390 Unspecified dementia without behavioral disturbance: Secondary | ICD-10-CM

## 2013-05-19 MED ORDER — POTASSIUM CHLORIDE CRYS ER 20 MEQ PO TBCR
40.0000 meq | EXTENDED_RELEASE_TABLET | Freq: Once | ORAL | Status: AC
  Administered 2013-05-19: 40 meq via ORAL
  Filled 2013-05-19: qty 2

## 2013-05-19 NOTE — ED Notes (Signed)
Pt.'spersonal belongings kept in a plastic bag; 1 pair of jeans with black belt, a pair  Of black ruber shoes, 1 red Polo Shirt, and underwear. Kept in [address]. 1 black wallet with driver's License,  Cash of $95.00, a credit card and VET ID kept in safety box c/o Security.

## 2013-05-19 NOTE — ED Notes (Signed)
Pt belongings moved to locker 43

## 2013-05-19 NOTE — Progress Notes (Signed)
CSW communicated with following facilities re: placement.  [facility]: beds available, CSW re-faxed referral info  [facility]: beds available, CSW faxed referral info  [facility]: beds available, CSW faxed referal info  [facility], [phone]     ED CSW  05/19/2013 12:57PM

## 2013-05-19 NOTE — Progress Notes (Addendum)
CSW contacted following facilities to follow up re: pt placement.  [facility]: Confirmed that information had been received. Pt denied due to acuity.  [facility]: Did not receive fax. CSW to resend.  [facility], [phone]     ED CSW  05/19/2013 11:59am

## 2013-05-19 NOTE — ED Notes (Signed)
MD at bedside. 
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
## 2013-05-19 NOTE — ED Provider Notes (Signed)
Pt has been evaluated by Psych team: pt does not meet inpt psychiatric admission criteria and they request to d/c pt home. Pt has significant hx of dementia and is waiting placement in Geriatric facility. Pt's wife would like to take him home now. Will d/c home with outpt f/u.  Laray Anger, DO 05/19/13 1430

## 2013-05-19 NOTE — Progress Notes (Signed)
CSW met with pt at bedside along with Psychiatric NP. Patient denies Si/HI/Ah/VH and patient able to contract for safety. Pt and pt wife stated they've had more disagreements because patient is no longer able to drive and pt wife has been told by [employer] that if she gives him the key she can be arrested. Pt verbalized understanding that he will never been able to drive again, and that patient wife did not make the decision but the doctors and the police department. Patient reports he is ready to return home and pt wife is as well. CSW informed EDP and RN. Pt to dc home with outpatient resources including mobile crisis.   Catha Gosselin, LCSW [phone]  ED CSW .05/19/2013 1306pm

## 2013-05-19 NOTE — Progress Notes (Addendum)
Called the following facilities for follow-up regarding placement:  [facility]. @ 1020- spoke with Britta Mccreedy, could not find referral and asked if it could be re-faxed, will re-fax paperwork [facility] @ 1030- spoke with [facility], stated pt had been declined by Dr. Aliene Beams d/t aggression  Update:  Received a phone call from [facility] from [place] who stated that she still had not received referral, referral sent again to 2 diff fax machines at facility.  Tomi Bamberger, MHT

## 2013-05-22 NOTE — Progress Notes (Signed)
Note reviewed and agreed with  

## 2013-05-27 ENCOUNTER — Emergency Department (HOSPITAL_COMMUNITY): Payer: Medicare Other

## 2013-05-27 ENCOUNTER — Emergency Department (HOSPITAL_COMMUNITY)
Admission: EM | Admit: 2013-05-27 | Discharge: 2013-05-29 | Disposition: A | Discharge status: Other Acute Inpatient Hospital | Payer: Medicare Other | Attending: Emergency Medicine | Admitting: Emergency Medicine

## 2013-05-27 ENCOUNTER — Encounter (HOSPITAL_COMMUNITY): Payer: Self-pay | Admitting: Emergency Medicine

## 2013-05-27 DIAGNOSIS — G40309 Generalized idiopathic epilepsy and epileptic syndromes, not intractable, without status epilepticus: Secondary | ICD-10-CM | POA: Insufficient documentation

## 2013-05-27 DIAGNOSIS — Z87891 Personal history of nicotine dependence: Secondary | ICD-10-CM | POA: Insufficient documentation

## 2013-05-27 DIAGNOSIS — F0281 Dementia in other diseases classified elsewhere with behavioral disturbance: Secondary | ICD-10-CM | POA: Insufficient documentation

## 2013-05-27 DIAGNOSIS — R45850 Homicidal ideations: Secondary | ICD-10-CM | POA: Insufficient documentation

## 2013-05-27 DIAGNOSIS — Z8673 Personal history of transient ischemic attack (TIA), and cerebral infarction without residual deficits: Secondary | ICD-10-CM | POA: Insufficient documentation

## 2013-05-27 DIAGNOSIS — R413 Other amnesia: Secondary | ICD-10-CM | POA: Insufficient documentation

## 2013-05-27 DIAGNOSIS — Z8739 Personal history of other diseases of the musculoskeletal system and connective tissue: Secondary | ICD-10-CM | POA: Insufficient documentation

## 2013-05-27 DIAGNOSIS — F29 Unspecified psychosis not due to a substance or known physiological condition: Secondary | ICD-10-CM | POA: Insufficient documentation

## 2013-05-27 DIAGNOSIS — F0280 Dementia in other diseases classified elsewhere without behavioral disturbance: Secondary | ICD-10-CM | POA: Insufficient documentation

## 2013-05-27 DIAGNOSIS — I252 Old myocardial infarction: Secondary | ICD-10-CM | POA: Insufficient documentation

## 2013-05-27 DIAGNOSIS — G309 Alzheimer's disease, unspecified: Secondary | ICD-10-CM | POA: Insufficient documentation

## 2013-05-27 DIAGNOSIS — Z951 Presence of aortocoronary bypass graft: Secondary | ICD-10-CM | POA: Insufficient documentation

## 2013-05-27 DIAGNOSIS — Z79899 Other long term (current) drug therapy: Secondary | ICD-10-CM | POA: Insufficient documentation

## 2013-05-27 DIAGNOSIS — Z8639 Personal history of other endocrine, nutritional and metabolic disease: Secondary | ICD-10-CM | POA: Insufficient documentation

## 2013-05-27 DIAGNOSIS — I2510 Atherosclerotic heart disease of native coronary artery without angina pectoris: Secondary | ICD-10-CM | POA: Insufficient documentation

## 2013-05-27 DIAGNOSIS — F02818 Dementia in other diseases classified elsewhere, unspecified severity, with other behavioral disturbance: Secondary | ICD-10-CM | POA: Diagnosis present

## 2013-05-27 DIAGNOSIS — G609 Hereditary and idiopathic neuropathy, unspecified: Secondary | ICD-10-CM | POA: Insufficient documentation

## 2013-05-27 DIAGNOSIS — I10 Essential (primary) hypertension: Secondary | ICD-10-CM | POA: Insufficient documentation

## 2013-05-27 LAB — ETHANOL: Alcohol, Ethyl (B): <11 mg/dL (ref 0–11)

## 2013-05-27 LAB — COMPREHENSIVE METABOLIC PANEL
AST: 14 U/L (ref 0–37)
Albumin: 3.8 g/dL (ref 3.5–5.2)
Calcium: 9.5 mg/dL (ref 8.4–10.5)
Creatinine, Ser: 1.12 mg/dL (ref 0.50–1.35)
GFR calc non Af Amer: 67 mL/min — ABNORMAL LOW (ref >90)
Potassium: 3.7 mEq/L (ref 3.5–5.1)
Sodium: 138 mEq/L (ref 135–145)

## 2013-05-27 LAB — CBC
Hemoglobin: 13.8 g/dL (ref 13.0–17.0)
MCH: 30.7 pg (ref 26.0–34.0)
MCHC: 34 g/dL (ref 30.0–36.0)
MCV: 90.4 fL (ref 78.0–100.0)
Platelets: 247 10*3/uL (ref 150–400)
RDW: 13.9 % (ref 11.5–15.5)

## 2013-05-27 MED ORDER — NIFEDIPINE ER 60 MG PO TB24
60.0000 mg | ORAL_TABLET | Freq: Every morning | ORAL | Status: DC
  Administered 2013-05-28 – 2013-05-29 (×2): 60 mg via ORAL
  Filled 2013-05-27 (×2): qty 1

## 2013-05-27 MED ORDER — FESOTERODINE FUMARATE ER 8 MG PO TB24
8.0000 mg | ORAL_TABLET | Freq: Every morning | ORAL | Status: DC
  Administered 2013-05-28 – 2013-05-29 (×2): 8 mg via ORAL
  Filled 2013-05-27 (×3): qty 1

## 2013-05-27 MED ORDER — LEVETIRACETAM 500 MG PO TABS
500.0000 mg | ORAL_TABLET | Freq: Two times a day (BID) | ORAL | Status: DC
  Administered 2013-05-28 – 2013-05-29 (×4): 500 mg via ORAL
  Filled 2013-05-27 (×5): qty 1

## 2013-05-27 MED ORDER — DARIFENACIN HYDROBROMIDE ER 7.5 MG PO TB24
7.5000 mg | ORAL_TABLET | Freq: Every day | ORAL | Status: DC
  Administered 2013-05-28 – 2013-05-29 (×2): 7.5 mg via ORAL
  Filled 2013-05-27 (×2): qty 1

## 2013-05-27 MED ORDER — PHENOBARBITAL 32.4 MG PO TABS
32.4000 mg | ORAL_TABLET | Freq: Two times a day (BID) | ORAL | Status: DC
  Administered 2013-05-28 – 2013-05-29 (×4): 32.4 mg via ORAL
  Filled 2013-05-27 (×4): qty 1

## 2013-05-27 MED ORDER — HYDROCHLOROTHIAZIDE 25 MG PO TABS
25.0000 mg | ORAL_TABLET | Freq: Every morning | ORAL | Status: DC
  Administered 2013-05-28 – 2013-05-29 (×2): 25 mg via ORAL
  Filled 2013-05-27 (×2): qty 1

## 2013-05-27 MED ORDER — LISINOPRIL 20 MG PO TABS
40.0000 mg | ORAL_TABLET | Freq: Every morning | ORAL | Status: DC
  Administered 2013-05-28 – 2013-05-29 (×2): 40 mg via ORAL
  Filled 2013-05-27 (×2): qty 2

## 2013-05-27 MED ORDER — ASPIRIN-DIPYRIDAMOLE ER 25-200 MG PO CP12
1.0000 | ORAL_CAPSULE | Freq: Two times a day (BID) | ORAL | Status: DC
  Administered 2013-05-28 – 2013-05-29 (×4): 1 via ORAL
  Filled 2013-05-27 (×5): qty 1

## 2013-05-27 MED ORDER — ATORVASTATIN CALCIUM 40 MG PO TABS
40.0000 mg | ORAL_TABLET | Freq: Every day | ORAL | Status: DC
  Administered 2013-05-29: 40 mg via ORAL
  Filled 2013-05-27 (×2): qty 1

## 2013-05-27 MED ORDER — MEMANTINE HCL 10 MG PO TABS
10.0000 mg | ORAL_TABLET | Freq: Two times a day (BID) | ORAL | Status: DC
  Administered 2013-05-28 – 2013-05-29 (×4): 10 mg via ORAL
  Filled 2013-05-27 (×5): qty 1

## 2013-05-27 MED ORDER — GALANTAMINE HYDROBROMIDE ER 16 MG PO CP24
16.0000 mg | ORAL_CAPSULE | Freq: Every day | ORAL | Status: DC
  Filled 2013-05-27: qty 1

## 2013-05-27 MED ORDER — QUETIAPINE FUMARATE 25 MG PO TABS
25.0000 mg | ORAL_TABLET | Freq: Every day | ORAL | Status: DC
  Administered 2013-05-28 (×2): 25 mg via ORAL
  Filled 2013-05-27 (×2): qty 1

## 2013-05-27 MED ORDER — FOLIC ACID 1 MG PO TABS
1.0000 mg | ORAL_TABLET | Freq: Every morning | ORAL | Status: DC
  Administered 2013-05-28 – 2013-05-29 (×2): 1 mg via ORAL
  Filled 2013-05-27 (×2): qty 1

## 2013-05-27 NOTE — ED Notes (Signed)
Pt talked to about his reason for coming.  Pt ambiguous about reason.  Pt understood where he was, what time it was and who the president was.  Pt asked Nursing about course of action, pt informed of the medical clearance process and that he had to stay at the hospital due to his IVC situation

## 2013-05-27 NOTE — ED Notes (Addendum)
Pt has 2 bags of belongings( coat, shoes, shirt, pants,hat,socks, and belt).  TCU locker 29.  Pt has a wallet with $18.03, master card debit, and NCID.   Pt has been seen and wanded by security.
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
## 2013-05-27 NOTE — ED Notes (Signed)
Pt's belonging including money counted and verified with [facility] Manual NT.  Property secured.

## 2013-05-28 DIAGNOSIS — F0281 Dementia in other diseases classified elsewhere with behavioral disturbance: Secondary | ICD-10-CM

## 2013-05-28 DIAGNOSIS — F0280 Dementia in other diseases classified elsewhere without behavioral disturbance: Secondary | ICD-10-CM

## 2013-05-28 DIAGNOSIS — G309 Alzheimer's disease, unspecified: Secondary | ICD-10-CM | POA: Diagnosis present

## 2013-05-28 LAB — URINALYSIS, ROUTINE W REFLEX MICROSCOPIC
Bilirubin Urine: NEGATIVE
Glucose, UA: NEGATIVE mg/dL
Hgb urine dipstick: NEGATIVE
Ketones, ur: NEGATIVE mg/dL
Leukocytes, UA: NEGATIVE
Nitrite: NEGATIVE
Protein, ur: NEGATIVE mg/dL
pH: 6 (ref 5.0–8.0)

## 2013-05-28 LAB — RAPID URINE DRUG SCREEN, HOSP PERFORMED
Amphetamines: NOT DETECTED
Barbiturates: POSITIVE — AB
Benzodiazepines: NOT DETECTED
Opiates: NOT DETECTED

## 2013-05-28 MED ORDER — GALANTAMINE HYDROBROMIDE 4 MG PO TABS
8.0000 mg | ORAL_TABLET | Freq: Two times a day (BID) | ORAL | Status: DC
  Administered 2013-05-28 – 2013-05-29 (×2): 8 mg via ORAL
  Administered 2013-05-29: 4 mg via ORAL
  Filled 2013-05-28 (×5): qty 2

## 2013-05-28 NOTE — Progress Notes (Signed)
Pt referred to [facility] pending review.  Pt declined [facility] due to aggression.  No beds at [facility], [facility], [facility], [place], [facility] [place], [facility], and [facility].   [facility] referral initiated.   Catha Gosselin, LCSW [phone]  ED CSW .05/28/2013 1439pm

## 2013-05-28 NOTE — BH Assessment (Signed)
Erik Morn, PA recommends Pt be transferred to a geriatric-psych unit. Contacted the following facilities with geriatric-psych units:  [facility]: At capacity [facility]: At capacity [facility]: At capacity [facility]: At capacity [facility]: At capacity [facility]: At capacity   [facility] [place], [facility], [facility] Triage Specialist
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
## 2013-05-28 NOTE — ED Notes (Signed)
Belongings to locker #29
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
## 2013-05-28 NOTE — ED Notes (Signed)
Pt ate 50% of his food

## 2013-05-28 NOTE — ED Provider Notes (Signed)
Patient awaiting placement at geri-psych facility. 8:13 AM Patient sleeping on exam.  Per RN, no new complaints.  Gerhard Munch, MD 05/28/13 [phone]
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
## 2013-05-29 ENCOUNTER — Encounter (HOSPITAL_COMMUNITY): Payer: Self-pay | Admitting: Registered Nurse

## 2013-05-29 MED ORDER — DILTIAZEM HCL 30 MG PO TABS
30.0000 mg | ORAL_TABLET | Freq: Four times a day (QID) | ORAL | Status: DC
  Filled 2013-05-29 (×4): qty 1

## 2013-05-29 NOTE — Progress Notes (Signed)
CSW received call from [place] at [facility] requesting an earlier EKG for pt that may have been done during an earlier visit.  CSW faxed EKG from 01/11/13.  CSW called to confirm fax receipt.  Left voicemail.  Marva Panda, Theresia Majors  [phone] .05/29/2013

## 2013-05-29 NOTE — Progress Notes (Addendum)
CSW spoke with Nicholos Johns at [facility], and pt pending review by MD. Pt EKG from 05/18/2013 requested, as well as ivc papers.   Catha Gosselin, LCSW [phone]  ED CSW .05/29/2013 10:17am   CSW left message for [facility], for [facility] regarding inpatient.  CSW spoke with Darlene at [facility], no beds available.    Frutoso Schatz [phone]  ED CSW 05/29/2013 12:50pm

## 2013-05-29 NOTE — Progress Notes (Signed)
CSW resent information to [employer]. Called to confirm receipt of fax.  CSW was told that fax machine was busy and to call back later to conifrm.   Marva Panda, Theresia Majors  [phone]  .05/29/2013 4:00 pm   CSW called [facility] again.  Spoke to [employer] and she reported that they had a fax jam and that fax was coming over currently.  Robinette reports that she will call CSW if she does not receive all 32 pages.  Marva Panda, Theresia Majors  [phone] .05/29/2013  4:35

## 2013-05-29 NOTE — ED Notes (Signed)
EDP made aware of bed availability at [facility]; Transportation via [employer] has been called and a message left on their voice mail; and the wife has been notified that the pt will be transferring hospitals.

## 2013-05-29 NOTE — ED Provider Notes (Signed)
5:38 PM Has been accepted at [facility] by Dr. Johnsie Cancel. I was not personally involved in this patients care.   Junius Argyle, MD 05/29/13 1739

## 2013-05-29 NOTE — Progress Notes (Signed)
CSW received call from Nicholos Johns that pt has been accepted to [facility] by Dr. Perfecto Kingdom to room 411-A.  Nurse can call report to [phone].   Marland KitchenMarva Panda, LCSWA  [phone]  .05/29/2013  5:30 pm
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
## 2013-05-31 NOTE — Consult Note (Signed)
Note reviewed and agreed with  

## 2013-05-31 NOTE — Consult Note (Signed)
Agree with above recommendations 

## 2013-06-17 ENCOUNTER — Emergency Department (HOSPITAL_COMMUNITY)
Admission: EM | Admit: 2013-06-17 | Discharge: 2013-06-17 | Disposition: A | Discharge status: Home / Self Care | Payer: Medicare Other | Attending: Emergency Medicine | Admitting: Emergency Medicine

## 2013-06-17 ENCOUNTER — Encounter (HOSPITAL_COMMUNITY): Payer: Self-pay | Admitting: Emergency Medicine

## 2013-06-17 ENCOUNTER — Emergency Department (HOSPITAL_COMMUNITY): Payer: Medicare Other

## 2013-06-17 DIAGNOSIS — R296 Repeated falls: Secondary | ICD-10-CM

## 2013-06-17 DIAGNOSIS — Z862 Personal history of diseases of the blood and blood-forming organs and certain disorders involving the immune mechanism: Secondary | ICD-10-CM | POA: Insufficient documentation

## 2013-06-17 DIAGNOSIS — G40309 Generalized idiopathic epilepsy and epileptic syndromes, not intractable, without status epilepticus: Secondary | ICD-10-CM | POA: Insufficient documentation

## 2013-06-17 DIAGNOSIS — I69319 Unspecified symptoms and signs involving cognitive functions following cerebral infarction: Secondary | ICD-10-CM

## 2013-06-17 DIAGNOSIS — I2510 Atherosclerotic heart disease of native coronary artery without angina pectoris: Secondary | ICD-10-CM | POA: Insufficient documentation

## 2013-06-17 DIAGNOSIS — Z951 Presence of aortocoronary bypass graft: Secondary | ICD-10-CM | POA: Insufficient documentation

## 2013-06-17 DIAGNOSIS — I10 Essential (primary) hypertension: Secondary | ICD-10-CM | POA: Insufficient documentation

## 2013-06-17 DIAGNOSIS — Z8673 Personal history of transient ischemic attack (TIA), and cerebral infarction without residual deficits: Secondary | ICD-10-CM | POA: Insufficient documentation

## 2013-06-17 DIAGNOSIS — Z87891 Personal history of nicotine dependence: Secondary | ICD-10-CM | POA: Insufficient documentation

## 2013-06-17 DIAGNOSIS — Y9389 Activity, other specified: Secondary | ICD-10-CM | POA: Insufficient documentation

## 2013-06-17 DIAGNOSIS — Z8739 Personal history of other diseases of the musculoskeletal system and connective tissue: Secondary | ICD-10-CM | POA: Insufficient documentation

## 2013-06-17 DIAGNOSIS — I252 Old myocardial infarction: Secondary | ICD-10-CM | POA: Insufficient documentation

## 2013-06-17 DIAGNOSIS — R5381 Other malaise: Secondary | ICD-10-CM | POA: Insufficient documentation

## 2013-06-17 DIAGNOSIS — W010XXA Fall on same level from slipping, tripping and stumbling without subsequent striking against object, initial encounter: Secondary | ICD-10-CM | POA: Insufficient documentation

## 2013-06-17 DIAGNOSIS — I69919 Unspecified symptoms and signs involving cognitive functions following unspecified cerebrovascular disease: Secondary | ICD-10-CM | POA: Insufficient documentation

## 2013-06-17 DIAGNOSIS — Z8639 Personal history of other endocrine, nutritional and metabolic disease: Secondary | ICD-10-CM | POA: Insufficient documentation

## 2013-06-17 DIAGNOSIS — Z79899 Other long term (current) drug therapy: Secondary | ICD-10-CM | POA: Insufficient documentation

## 2013-06-17 DIAGNOSIS — Y92410 Unspecified street and highway as the place of occurrence of the external cause: Secondary | ICD-10-CM | POA: Insufficient documentation

## 2013-06-17 DIAGNOSIS — F0390 Unspecified dementia without behavioral disturbance: Secondary | ICD-10-CM | POA: Insufficient documentation

## 2013-06-17 LAB — CBC WITH DIFFERENTIAL/PLATELET
Eosinophils Absolute: 0.1 10*3/uL (ref 0.0–0.7)
HCT: 39.7 % (ref 39.0–52.0)
Hemoglobin: 13.4 g/dL (ref 13.0–17.0)
Lymphocytes Relative: 25 % (ref 12–46)
Lymphs Abs: 1.9 10*3/uL (ref 0.7–4.0)
MCH: 31.3 pg (ref 26.0–34.0)
MCV: 92.8 fL (ref 78.0–100.0)
Monocytes Absolute: 0.6 10*3/uL (ref 0.1–1.0)
Neutrophils Relative %: 67 % (ref 43–77)
Platelets: 180 10*3/uL (ref 150–400)
RBC: 4.28 MIL/uL (ref 4.22–5.81)
WBC: 7.7 10*3/uL (ref 4.0–10.5)

## 2013-06-17 LAB — URINALYSIS, ROUTINE W REFLEX MICROSCOPIC
Glucose, UA: NEGATIVE mg/dL
Leukocytes, UA: NEGATIVE
Specific Gravity, Urine: 1.027 (ref 1.005–1.030)
Urobilinogen, UA: 0.2 mg/dL (ref 0.0–1.0)
pH: 7.5 (ref 5.0–8.0)

## 2013-06-17 LAB — BASIC METABOLIC PANEL
CO2: 27 mEq/L (ref 19–32)
Chloride: 106 mEq/L (ref 96–112)
Creatinine, Ser: 1.03 mg/dL (ref 0.50–1.35)
GFR calc Af Amer: 85 mL/min — ABNORMAL LOW (ref >90)
GFR calc non Af Amer: 74 mL/min — ABNORMAL LOW (ref >90)
Sodium: 141 mEq/L (ref 135–145)

## 2013-06-17 MED ORDER — SODIUM CHLORIDE 0.9 % IV BOLUS (SEPSIS)
500.0000 mL | Freq: Once | INTRAVENOUS | Status: AC
  Administered 2013-06-17: 500 mL via INTRAVENOUS

## 2013-06-17 NOTE — ED Notes (Signed)
Pt arrived from home by [facility] after a fall while pushing trash cans to the road. Denies any pain, no LOC and did not hit head when he fell. Pt wife stated to EMS that pt has been having increase confusion the past 2 days and has been having multiple falls lately. Normally A&O x 4 but today is disoriented to time. When FD arrived on scene pt was unable to stand on his own. 12 lead showed right bundle branch block. BP-120/90 HR-60 CBG-84 O2sat-97% RA
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
## 2013-06-24 ENCOUNTER — Inpatient Hospital Stay (HOSPITAL_COMMUNITY)
Admission: EM | Admit: 2013-06-24 | Discharge: 2013-06-28 | DRG: 948 | Disposition: A | Discharge status: Skilled Nursing Facility | Payer: Medicare Other | Attending: Internal Medicine | Admitting: Internal Medicine

## 2013-06-24 ENCOUNTER — Emergency Department (HOSPITAL_COMMUNITY): Payer: Medicare Other

## 2013-06-24 ENCOUNTER — Encounter (HOSPITAL_COMMUNITY): Payer: Self-pay | Admitting: Emergency Medicine

## 2013-06-24 DIAGNOSIS — I4891 Unspecified atrial fibrillation: Secondary | ICD-10-CM

## 2013-06-24 DIAGNOSIS — Z951 Presence of aortocoronary bypass graft: Secondary | ICD-10-CM

## 2013-06-24 DIAGNOSIS — F0280 Dementia in other diseases classified elsewhere without behavioral disturbance: Secondary | ICD-10-CM | POA: Diagnosis present

## 2013-06-24 DIAGNOSIS — R633 Feeding difficulties, unspecified: Secondary | ICD-10-CM | POA: Diagnosis present

## 2013-06-24 DIAGNOSIS — I639 Cerebral infarction, unspecified: Secondary | ICD-10-CM

## 2013-06-24 DIAGNOSIS — G40309 Generalized idiopathic epilepsy and epileptic syndromes, not intractable, without status epilepticus: Secondary | ICD-10-CM

## 2013-06-24 DIAGNOSIS — F0281 Dementia in other diseases classified elsewhere with behavioral disturbance: Secondary | ICD-10-CM | POA: Diagnosis present

## 2013-06-24 DIAGNOSIS — M899 Disorder of bone, unspecified: Secondary | ICD-10-CM

## 2013-06-24 DIAGNOSIS — E785 Hyperlipidemia, unspecified: Secondary | ICD-10-CM

## 2013-06-24 DIAGNOSIS — Z9181 History of falling: Secondary | ICD-10-CM

## 2013-06-24 DIAGNOSIS — G4733 Obstructive sleep apnea (adult) (pediatric): Secondary | ICD-10-CM

## 2013-06-24 DIAGNOSIS — G609 Hereditary and idiopathic neuropathy, unspecified: Secondary | ICD-10-CM

## 2013-06-24 DIAGNOSIS — M8580 Other specified disorders of bone density and structure, unspecified site: Secondary | ICD-10-CM

## 2013-06-24 DIAGNOSIS — R413 Other amnesia: Secondary | ICD-10-CM

## 2013-06-24 DIAGNOSIS — E538 Deficiency of other specified B group vitamins: Secondary | ICD-10-CM

## 2013-06-24 DIAGNOSIS — I10 Essential (primary) hypertension: Secondary | ICD-10-CM

## 2013-06-24 DIAGNOSIS — W19XXXS Unspecified fall, sequela: Secondary | ICD-10-CM

## 2013-06-24 DIAGNOSIS — IMO0002 Reserved for concepts with insufficient information to code with codable children: Secondary | ICD-10-CM

## 2013-06-24 DIAGNOSIS — G309 Alzheimer's disease, unspecified: Secondary | ICD-10-CM

## 2013-06-24 DIAGNOSIS — R569 Unspecified convulsions: Secondary | ICD-10-CM

## 2013-06-24 DIAGNOSIS — R270 Ataxia, unspecified: Secondary | ICD-10-CM

## 2013-06-24 DIAGNOSIS — W19XXXA Unspecified fall, initial encounter: Secondary | ICD-10-CM

## 2013-06-24 DIAGNOSIS — Z87891 Personal history of nicotine dependence: Secondary | ICD-10-CM

## 2013-06-24 DIAGNOSIS — I2510 Atherosclerotic heart disease of native coronary artery without angina pectoris: Secondary | ICD-10-CM | POA: Diagnosis present

## 2013-06-24 DIAGNOSIS — R32 Unspecified urinary incontinence: Secondary | ICD-10-CM | POA: Diagnosis present

## 2013-06-24 DIAGNOSIS — R279 Unspecified lack of coordination: Secondary | ICD-10-CM | POA: Diagnosis present

## 2013-06-24 DIAGNOSIS — R6251 Failure to thrive (child): Secondary | ICD-10-CM

## 2013-06-24 DIAGNOSIS — I252 Old myocardial infarction: Secondary | ICD-10-CM

## 2013-06-24 DIAGNOSIS — F0390 Unspecified dementia without behavioral disturbance: Secondary | ICD-10-CM

## 2013-06-24 DIAGNOSIS — F02818 Dementia in other diseases classified elsewhere, unspecified severity, with other behavioral disturbance: Secondary | ICD-10-CM | POA: Diagnosis present

## 2013-06-24 DIAGNOSIS — R5381 Other malaise: Principal | ICD-10-CM | POA: Diagnosis present

## 2013-06-24 DIAGNOSIS — M6281 Muscle weakness (generalized): Secondary | ICD-10-CM

## 2013-06-24 DIAGNOSIS — I69998 Other sequelae following unspecified cerebrovascular disease: Secondary | ICD-10-CM

## 2013-06-24 DIAGNOSIS — R531 Weakness: Secondary | ICD-10-CM

## 2013-06-24 HISTORY — DX: Obstructive sleep apnea (adult) (pediatric): G47.33

## 2013-06-24 HISTORY — DX: Weakness: R53.1

## 2013-06-24 HISTORY — DX: Dependence on other enabling machines and devices: Z99.89

## 2013-06-24 LAB — URINALYSIS, ROUTINE W REFLEX MICROSCOPIC
Hgb urine dipstick: NEGATIVE
Leukocytes, UA: NEGATIVE
Nitrite: NEGATIVE
Specific Gravity, Urine: 1.029 (ref 1.005–1.030)
Urobilinogen, UA: 0.2 mg/dL (ref 0.0–1.0)

## 2013-06-24 LAB — CBC
HCT: 44.6 % (ref 39.0–52.0)
MCHC: 33.9 g/dL (ref 30.0–36.0)
MCV: 93.1 fL (ref 78.0–100.0)
Platelets: 176 10*3/uL (ref 150–400)
RBC: 4.79 MIL/uL (ref 4.22–5.81)
WBC: 12.2 10*3/uL — ABNORMAL HIGH (ref 4.0–10.5)

## 2013-06-24 LAB — BASIC METABOLIC PANEL
CO2: 23 mEq/L (ref 19–32)
Chloride: 102 mEq/L (ref 96–112)
Creatinine, Ser: 1 mg/dL (ref 0.50–1.35)
GFR calc non Af Amer: 76 mL/min — ABNORMAL LOW (ref >90)
Potassium: 4.1 mEq/L (ref 3.5–5.1)
Sodium: 141 mEq/L (ref 135–145)

## 2013-06-24 LAB — GLUCOSE, CAPILLARY
Glucose-Capillary: 146 mg/dL — ABNORMAL HIGH (ref 70–99)
Glucose-Capillary: 83 mg/dL (ref 70–99)

## 2013-06-24 LAB — VALPROIC ACID LEVEL: Valproic Acid Lvl: 57.2 ug/mL (ref 50.0–100.0)

## 2013-06-24 LAB — TROPONIN I: Troponin I: <0.3 ng/mL (ref <0.30)

## 2013-06-24 MED ORDER — LEVETIRACETAM 500 MG PO TABS
500.0000 mg | ORAL_TABLET | Freq: Two times a day (BID) | ORAL | Status: DC
  Administered 2013-06-24 – 2013-06-28 (×8): 500 mg via ORAL
  Filled 2013-06-24 (×9): qty 1

## 2013-06-24 MED ORDER — DIVALPROEX SODIUM ER 250 MG PO TB24
250.0000 mg | ORAL_TABLET | Freq: Every day | ORAL | Status: DC
  Administered 2013-06-25 – 2013-06-28 (×4): 250 mg via ORAL
  Filled 2013-06-24 (×4): qty 1

## 2013-06-24 MED ORDER — MEMANTINE HCL 10 MG PO TABS
10.0000 mg | ORAL_TABLET | Freq: Two times a day (BID) | ORAL | Status: DC
  Administered 2013-06-24 – 2013-06-28 (×7): 10 mg via ORAL
  Filled 2013-06-24 (×10): qty 1

## 2013-06-24 MED ORDER — PHENOBARBITAL 32.4 MG PO TABS
32.4000 mg | ORAL_TABLET | Freq: Two times a day (BID) | ORAL | Status: DC
  Administered 2013-06-24 – 2013-06-28 (×7): 32.4 mg via ORAL
  Filled 2013-06-24 (×7): qty 1

## 2013-06-24 MED ORDER — ASPIRIN-DIPYRIDAMOLE ER 25-200 MG PO CP12
1.0000 | ORAL_CAPSULE | Freq: Two times a day (BID) | ORAL | Status: DC
  Administered 2013-06-24 – 2013-06-28 (×8): 1 via ORAL
  Filled 2013-06-24 (×9): qty 1

## 2013-06-24 MED ORDER — QUETIAPINE FUMARATE 50 MG PO TABS
50.0000 mg | ORAL_TABLET | Freq: Every day | ORAL | Status: DC
  Administered 2013-06-24 – 2013-06-27 (×3): 50 mg via ORAL
  Filled 2013-06-24 (×5): qty 1

## 2013-06-24 MED ORDER — ESCITALOPRAM OXALATE 20 MG PO TABS
20.0000 mg | ORAL_TABLET | Freq: Every day | ORAL | Status: DC
  Administered 2013-06-25 – 2013-06-28 (×4): 20 mg via ORAL
  Filled 2013-06-24 (×4): qty 1

## 2013-06-24 MED ORDER — GALANTAMINE HYDROBROMIDE ER 16 MG PO CP24
16.0000 mg | ORAL_CAPSULE | Freq: Every day | ORAL | Status: DC
  Filled 2013-06-24 (×3): qty 1

## 2013-06-24 MED ORDER — ENOXAPARIN SODIUM 40 MG/0.4ML ~~LOC~~ SOLN
40.0000 mg | SUBCUTANEOUS | Status: DC
  Administered 2013-06-24 – 2013-06-28 (×4): 40 mg via SUBCUTANEOUS
  Filled 2013-06-24 (×5): qty 0.4

## 2013-06-24 MED ORDER — ATORVASTATIN CALCIUM 40 MG PO TABS
40.0000 mg | ORAL_TABLET | Freq: Every day | ORAL | Status: DC
  Administered 2013-06-25 – 2013-06-27 (×3): 40 mg via ORAL
  Filled 2013-06-24 (×4): qty 1
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
## 2013-06-24 NOTE — ED Notes (Signed)
Wife reports worsening confusion, generalized weakness & therefore frequent falls past 1-2 weeks. Seen in ED for fall last Sunday. Wife reports fell 6 times past week, fell today x2. Witnessed, wife denies LOC.  Pt eating & drinking normally. Wife states weakness is so bad now that pt unable to ambulate at all even with assistance. Pt oriented to person only, denies any pain or injury from fall.
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
## 2013-06-24 NOTE — ED Notes (Signed)
Pt pass swallow study test
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
## 2013-06-24 NOTE — ED Notes (Signed)
Wife states pt was here last week for a fall in the driveway and she couldn't get him up. Since he was discharged home he has fallen 6 more times at home. Wife states he has some "marks on his back and shoulders" from the falls. Wife states he continues to get weaker and she can not take care of him. Pt is a&o, grips = bilaterally.

## 2013-06-25 ENCOUNTER — Observation Stay (HOSPITAL_COMMUNITY): Payer: Medicare Other

## 2013-06-25 DIAGNOSIS — G4733 Obstructive sleep apnea (adult) (pediatric): Secondary | ICD-10-CM

## 2013-06-25 DIAGNOSIS — G459 Transient cerebral ischemic attack, unspecified: Secondary | ICD-10-CM

## 2013-06-25 DIAGNOSIS — W19XXXA Unspecified fall, initial encounter: Secondary | ICD-10-CM

## 2013-06-25 DIAGNOSIS — E785 Hyperlipidemia, unspecified: Secondary | ICD-10-CM

## 2013-06-25 DIAGNOSIS — R569 Unspecified convulsions: Secondary | ICD-10-CM

## 2013-06-25 DIAGNOSIS — I4891 Unspecified atrial fibrillation: Secondary | ICD-10-CM

## 2013-06-25 DIAGNOSIS — G40309 Generalized idiopathic epilepsy and epileptic syndromes, not intractable, without status epilepticus: Secondary | ICD-10-CM

## 2013-06-25 DIAGNOSIS — R269 Unspecified abnormalities of gait and mobility: Secondary | ICD-10-CM

## 2013-06-25 DIAGNOSIS — I10 Essential (primary) hypertension: Secondary | ICD-10-CM

## 2013-06-25 DIAGNOSIS — I517 Cardiomegaly: Secondary | ICD-10-CM

## 2013-06-25 LAB — GLUCOSE, CAPILLARY: Glucose-Capillary: 100 mg/dL — ABNORMAL HIGH (ref 70–99)

## 2013-06-25 LAB — COMPREHENSIVE METABOLIC PANEL
AST: 19 U/L (ref 0–37)
Albumin: 3.6 g/dL (ref 3.5–5.2)
CO2: 27 mEq/L (ref 19–32)
Calcium: 9.6 mg/dL (ref 8.4–10.5)
Creatinine, Ser: 1.09 mg/dL (ref 0.50–1.35)
GFR calc Af Amer: 80 mL/min — ABNORMAL LOW (ref >90)
GFR calc non Af Amer: 69 mL/min — ABNORMAL LOW (ref >90)
Glucose, Bld: 80 mg/dL (ref 70–99)

## 2013-06-25 LAB — LIPID PANEL
Cholesterol: 155 mg/dL (ref 0–200)
LDL Cholesterol: 83 mg/dL (ref 0–99)
Total CHOL/HDL Ratio: 2.9 RATIO
Triglycerides: 88 mg/dL (ref <150)
VLDL: 18 mg/dL (ref 0–40)

## 2013-06-25 LAB — RAPID URINE DRUG SCREEN, HOSP PERFORMED
Benzodiazepines: NOT DETECTED
Cocaine: NOT DETECTED
Opiates: NOT DETECTED

## 2013-06-25 LAB — CBC WITH DIFFERENTIAL/PLATELET
Basophils Absolute: 0 10*3/uL (ref 0.0–0.1)
Basophils Relative: 0 % (ref 0–1)
Eosinophils Relative: 1 % (ref 0–5)
HCT: 44.8 % (ref 39.0–52.0)
Lymphocytes Relative: 23 % (ref 12–46)
Lymphs Abs: 2.6 10*3/uL (ref 0.7–4.0)
MCHC: 33.9 g/dL (ref 30.0–36.0)
MCV: 93.5 fL (ref 78.0–100.0)
Monocytes Absolute: 0.9 10*3/uL (ref 0.1–1.0)
RDW: 15.3 % (ref 11.5–15.5)
WBC: 11.2 10*3/uL — ABNORMAL HIGH (ref 4.0–10.5)

## 2013-06-25 LAB — HEMOGLOBIN A1C: Hgb A1c MFr Bld: 5.9 % — ABNORMAL HIGH (ref <5.7)

## 2013-06-25 LAB — PROTIME-INR
INR: 1.19 (ref 0.00–1.49)
Prothrombin Time: 14.8 seconds (ref 11.6–15.2)

## 2013-06-25 LAB — FOLATE: Folate: >20 ng/mL

## 2013-06-25 LAB — RPR: RPR Ser Ql: NONREACTIVE

## 2013-06-25 MED ORDER — SODIUM CHLORIDE 0.9 % IV BOLUS (SEPSIS)
500.0000 mL | Freq: Once | INTRAVENOUS | Status: AC
  Administered 2013-06-25: 500 mL via INTRAVENOUS

## 2013-06-25 MED ORDER — GALANTAMINE HYDROBROMIDE 4 MG PO TABS
8.0000 mg | ORAL_TABLET | Freq: Two times a day (BID) | ORAL | Status: DC
  Administered 2013-06-25 – 2013-06-28 (×7): 8 mg via ORAL
  Filled 2013-06-25 (×10): qty 2

## 2013-06-25 NOTE — Plan of Care (Signed)
Pt unable to void since last night. Denies discomfort or tenderness. Bladder scan done and showed more than . In and out done. Urine culture sent. 350 ml emptied. Will cont to monitor

## 2013-06-25 NOTE — Progress Notes (Signed)
NEURO HOSPITALIST PROGRESS NOTE   SUBJECTIVE:                                                                                                                        Patient is very drowsy, has no complaints.  Wife is at bedside who states he has been showing increased (generalized) weakness for the last two weeks along with poor appetite. She has noted he has been shuffling bilateral Left> right.   OBJECTIVE:                                                                                                                           Vital signs in last 24 hours: Temp:  [97.4 F (36.3 C)-98.7 F (37.1 C)] 97.6 F (36.4 C) (11/10 1108) Pulse Rate:  [75-128] 78 (11/10 1108) Resp:  [13-24] 20 (11/10 1108) BP: (101-127)/(74-95) 104/80 mmHg (11/10 1108) SpO2:  [94 %-100 %] 97 % (11/10 1108) Weight:  [81.92 kg (180 lb 9.6 oz)] 81.92 kg (180 lb 9.6 oz) (11/09 2231)  Intake/Output from previous day:   Intake/Output this shift:   Nutritional status: Cardiac  Past Medical History  Diagnosis Date  . Hypertension   . Stroke     Multiple  . Seizures     partial and generalized, poor compliance  . Myocardial infarction   . Unspecified hereditary and idiopathic peripheral neuropathy   . Vitamin B12 deficiency   . Stroke   . High blood pressure   . Ataxia   . Thoracic or lumbosacral neuritis or radiculitis, unspecified   . Coronary atherosclerosis of artery bypass graft   . Memory loss   . Generalized convulsive epilepsy without mention of intractable epilepsy   . Obstructive sleep apnea on CPAP   . Weakness of left side of body     S/P CVA  . Dementia      Neurologic Exam:   Mental Status: Alert, not oriented, thought content slow.  Speech fluent without evidence of aphasia.  Able to follow simple commands but multitask commands he could not follow.  Cranial Nerves: II: Visual fields grossly normal, pupils equal, round, reactive to light and  accommodation III,IV, VI: ptosis not present, extra-ocular motions intact bilaterally V,VII: smile asymmetric with  decreased left NLF, facial light touch sensation normal bilaterally VIII: hearing normal bilaterally IX,X: gag reflex present XI: bilateral shoulder shrug XII: midline tongue extension without atrophy or fasciculations  Motor: Right : Upper extremity   5/5    Left:     Upper extremity   5/5  Lower extremity   4+/5    Lower extremity   4+/5 Tone and bulk:normal tone throughout; no atrophy noted Sensory: patient shows no PP, LT sensation to ankle, decreased vibratory to knee, no proprioception bilateral feet.  Deep Tendon Reflexes:  1+ bilaterally upper extremity bicep and brachioradialis.   Lower Extremity Lower Extremity  0/4 bilaterally  Plantars: Right: downgoing   Left: downgoing  Lab Results: Lab Results  Component Value Date/Time   CHOL 155 06/25/2013  5:15 AM   Lipid Panel  Recent Labs  06/25/13 0515  CHOL 155  TRIG 88  HDL 54  CHOLHDL 2.9  VLDL 18  LDLCALC 83    Studies/Results: Dg Chest 2 View  06/24/2013   CLINICAL DATA:  History of hypertension, stroke, seizures and myocardial infarction, fell today  EXAM: CHEST  2 VIEW  COMPARISON:  05/27/2013  FINDINGS: Moderate cardiomegaly. Status post CABG. Uncoiled aorta. Vascular pattern normal. Lungs clear. No pleural effusions. Bony thorax is intact.  IMPRESSION: No acute findings.   Electronically Signed   By: Esperanza Heir M.D.   On: 06/24/2013 19:08   Dg Pelvis 1-2 Views  06/24/2013   CLINICAL DATA:  Patient fell today and landed on buttocks  EXAM: PELVIS - 1-2 VIEW  COMPARISON:  None.  FINDINGS: There is no evidence of pelvic fracture or diastasis. No other pelvic bone lesions are seen. There is a 1.3 cm focus of foreign body material projecting over the inner wall of the right acetabulum of uncertain origin.  IMPRESSION: No fracture. Foreign body of uncertain origin on the right. It is unlikely to be  of acute significance.   Electronically Signed   By: Esperanza Heir M.D.   On: 06/24/2013 19:07   Ct Head Wo Contrast  06/24/2013   CLINICAL DATA:  Patient fell last week and has fallen 6 more times since then  EXAM: CT HEAD WITHOUT CONTRAST  TECHNIQUE: Contiguous axial images were obtained from the base of the skull through the vertex without intravenous contrast.  COMPARISON:  06/17/2013  FINDINGS: The calvarium is intact with no skull fracture. No significant paranasal sinus inflammatory change. There is moderate diffuse atrophy. No focal attenuation abnormality to suggest vascular territory infarct. Small area of low attenuation left anterior pons, possibly representing lacunar infarct. No hemorrhage or extra-axial fluid. Low attenuation in the deep white matter consistent with chronic small vessel ischemic change.  IMPRESSION: No acute traumatic injury. Chronic involutional change. Possible tiny lacunar pontine infarct, age uncertain.   Electronically Signed   By: Esperanza Heir M.D.   On: 06/24/2013 19:03   Mr Brain Wo Contrast  06/25/2013   CLINICAL DATA:  Ataxia. Confusion. History of stroke, seizures, hypertension and dementia.  EXAM: MRI HEAD WITHOUT CONTRAST  MRA HEAD WITHOUT CONTRAST  TECHNIQUE: Multiplanar, multiecho pulse sequences of the brain and surrounding structures were obtained without intravenous contrast. Angiographic images of the head were obtained using MRA technique without contrast.  COMPARISON:  06/24/2013 CT. 07/03/2011 and 08/01/2010 MR.  FINDINGS: MRI HEAD FINDINGS  Exam is motion degraded.  No acute infarct.  Remote posterior right frontal lobe infarct. Remote tiny cerebellar infarcts. Remote coronal radiata/ basal ganglia infarcts/prominent small vessel disease  type changes. Small vessel disease type changes of the pons.  Within the right uncus, 5 mm lesion has an appearance suggestive of a small cavernoma and is slightly more conspicuous than on prior examinations.  No  other evidence of intracranial mass/ hemorrhage.  Global atrophy. The degree of ventricular prominence may be related to atrophy although difficult to exclude a mild component of hydrocephalus. Appearance is without significant change.  Mild exophthalmos.  MRA HEAD FINDINGS  Moderate narrowing petrous segment of the right internal carotid artery. Moderate to marked narrowing pre cavernous segment right internal carotid artery. High-grade stenosis at the junction of the cavernous and supraclinoid aspect of the right internal carotid artery.  Moderate to marked narrowing cavernous segment left internal carotid artery.  Aplastic A1 segment right anterior cerebral artery.  Ectatic M1 segment middle cerebral artery bilaterally.  Middle cerebral artery moderate branch vessel irregularity bilaterally with decrease number of visualized right middle cerebral artery branches consistent with patient's remote infarct.  Mild narrowing A2 segment left anterior cerebral artery.  Ectatic vertebral arteries bilaterally with moderate to marked narrowing distal left vertebral artery and moderate narrowing and irregularity distal right vertebral artery.  Moderate narrowing of the posterior inferior cerebellar artery bilaterally.  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  Moderate to marked narrowing mid aspect of the basilar artery.  Non visualization anterior inferior cerebellar arteries bilaterally.  Moderate narrowing distal basilar artery just proximal to the takeoff of the superior cerebral arteries.  Marked narrowing mid aspect left posterior cerebral artery. Moderate narrowing proximal and mid aspect right posterior cerebral artery. Narrowing of the posterior cerebral artery distal branches greater on the right.  IMPRESSION: Exam is motion degraded.  No acute infarct.  Remote posterior right frontal lobe infarct. Remote tiny cerebellar infarcts. Remote coronal radiata/ basal ganglia infarcts/prominent small vessel  disease type changes. Small vessel disease type changes of the pons.  Within the right uncus, 5 mm lesion has an appearance suggestive of a small cavernoma and is slightly more conspicuous than on prior examinations.  Global atrophy. The degree of ventricular prominence may be related to atrophy although difficult to exclude a mild component of hydrocephalus. Appearance is without significant change.  Progressive significant intracranial atherosclerotic type changes as detailed above.   Electronically Signed   By: Bridgett Larsson M.D.   On: 06/25/2013 08:49   Mr Maxine Glenn Head/brain Wo Cm  06/25/2013   CLINICAL DATA:  Ataxia. Confusion. History of stroke, seizures, hypertension and dementia.  EXAM: MRI HEAD WITHOUT CONTRAST  MRA HEAD WITHOUT CONTRAST  TECHNIQUE: Multiplanar, multiecho pulse sequences of the brain and surrounding structures were obtained without intravenous contrast. Angiographic images of the head were obtained using MRA technique without contrast.  COMPARISON:  06/24/2013 CT. 07/03/2011 and 08/01/2010 MR.  FINDINGS: MRI HEAD FINDINGS  Exam is motion degraded.  No acute infarct.  Remote posterior right frontal lobe infarct. Remote tiny cerebellar infarcts. Remote coronal radiata/ basal ganglia infarcts/prominent small vessel disease type changes. Small vessel disease type changes of the pons.  Within the right uncus, 5 mm lesion has an appearance suggestive of a small cavernoma and is slightly more conspicuous than on prior examinations.  No other evidence of intracranial mass/ hemorrhage.  Global atrophy. The degree of ventricular prominence may be related to atrophy although difficult to exclude a mild component of hydrocephalus. Appearance is without significant change.  Mild exophthalmos.  MRA HEAD FINDINGS  Moderate narrowing petrous segment of the right internal carotid artery. Moderate to marked  narrowing pre cavernous segment right internal carotid artery. High-grade stenosis at the junction of  the cavernous and supraclinoid aspect of the right internal carotid artery.  Moderate to marked narrowing cavernous segment left internal carotid artery.  Aplastic A1 segment right anterior cerebral artery.  Ectatic M1 segment middle cerebral artery bilaterally.  Middle cerebral artery moderate branch vessel irregularity bilaterally with decrease number of visualized right middle cerebral artery branches consistent with patient's remote infarct.  Mild narrowing A2 segment left anterior cerebral artery.  Ectatic vertebral arteries bilaterally with moderate to marked narrowing distal left vertebral artery and moderate narrowing and irregularity distal right vertebral artery.  Moderate narrowing of the posterior inferior cerebellar artery bilaterally.  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  Moderate to marked narrowing mid aspect of the basilar artery.  Non visualization anterior inferior cerebellar arteries bilaterally.  Moderate narrowing distal basilar artery just proximal to the takeoff of the superior cerebral arteries.  Marked narrowing mid aspect left posterior cerebral artery. Moderate narrowing proximal and mid aspect right posterior cerebral artery. Narrowing of the posterior cerebral artery distal branches greater on the right.  IMPRESSION: Exam is motion degraded.  No acute infarct.  Remote posterior right frontal lobe infarct. Remote tiny cerebellar infarcts. Remote coronal radiata/ basal ganglia infarcts/prominent small vessel disease type changes. Small vessel disease type changes of the pons.  Within the right uncus, 5 mm lesion has an appearance suggestive of a small cavernoma and is slightly more conspicuous than on prior examinations.  Global atrophy. The degree of ventricular prominence may be related to atrophy although difficult to exclude a mild component of hydrocephalus. Appearance is without significant change.  Progressive significant intracranial atherosclerotic type changes as  detailed above.   Electronically Signed   By: Bridgett Larsson M.D.   On: 06/25/2013 08:49    MEDICATIONS                                                                                                                        Scheduled: . atorvastatin  40 mg Oral q1800  . dipyridamole-aspirin  1 capsule Oral BID  . divalproex  250 mg Oral Daily  . enoxaparin (LOVENOX) injection  40 mg Subcutaneous Q24H  . escitalopram  20 mg Oral Daily  . galantamine  16 mg Oral Q breakfast  . galantamine  8 mg Oral BID WC  . levETIRAcetam  500 mg Oral Q12H  . memantine  10 mg Oral BID  . PHENobarbital  32.4 mg Oral BID  . QUEtiapine  50 mg Oral QHS    ASSESSMENT/PLAN:  67 YO male with 2 week increased general weakness and increased gait difficulty. MRI brain showed no acute infarct.  Exam shows significant LE peripheral neuropathy with no vibratory and proprioception sensation in bilateral feet. LDL 83, Carotid dopplers shows no ICA stenosis, A1c 5.9, B12 normal, Folate normal, VPA 57.2.  Recommend: 1) Echo.  2) PT for gait and strengthening.   No further recommendations at this time.    Assessment and plan discussed with with attending physician and they are in agreement.    Felicie Morn PA-C [facility] [phone]  06/25/2013, 12:36 PM

## 2013-06-25 NOTE — Progress Notes (Signed)
   CARE MANAGEMENT NOTE 06/25/2013  Patient:  [name], [name]   Account Number:  [id number]  Date Initiated:  06/25/2013  Documentation initiated by:  Jiles Crocker  Subjective/Objective Assessment:   ADMITTED WITH CVA     Action/Plan:   CM FOLLOWING FOR DCP   Anticipated DC Date:  06/29/2013   Anticipated DC Plan:  POSSIBLE HOME W [facility]; AWAITING FOR PT/OT EVALS;    DC Planning Services  CM consult         Status of service:  In process, will continue to follow Medicare Important Message given?  NA - LOS <3 / Initial given by admissions (If response is "NO", the following Medicare IM given date fields will be blank)  Per UR Regulation:  Reviewed for med. necessity/level of care/duration of stay  Comments:  06/25/2013- Erik Oather [place] RN,BSN,MHA

## 2013-06-25 NOTE — Evaluation (Signed)
Physical Therapy Evaluation Patient Details Name: [name] MRN: [id number] DOB: 1946-04-18 Today's Date: 06/25/2013 Time: 4098-1191 PT Time Calculation (min): 19 min  PT Assessment / Plan / Recommendation History of Present Illness  Pt admitted with falls and weakness with inability to walk.  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  MRI is without acute changes, display global atrophy.    Clinical Impression  Pt presents with baseline cognitive deficits and no family present to determine if pt is at his baseline.  Requires A for all aspects of mobility and feel SNF is safest D/C option at this time.      PT Assessment  Patient needs continued PT services    Follow Up Recommendations  SNF    Does the patient have the potential to tolerate intense rehabilitation      Barriers to Discharge        Equipment Recommendations  Rolling walker with 5" wheels    Recommendations for Other Services     Frequency Min 3X/week    Precautions / Restrictions Precautions Precautions: Fall Restrictions Weight Bearing Restrictions: No   Pertinent Vitals/Pain Denied pain.        Mobility  Bed Mobility Bed Mobility: Supine to Sit;Sit to Supine;Sitting - Scoot to [place] of Bed;Scooting to HOB Supine to Sit: 1: +2 Total assist;HOB elevated Supine to Sit: Patient Percentage: 0% Sitting - Scoot to Edge of Bed: 1: +2 Total assist Sitting - Scoot to Edge of Bed: Patient Percentage: 0% Sit to Supine: 1: +2 Total assist;HOB flat Sit to Supine: Patient Percentage: 20% Scooting to HOB: 1: +2 Total assist Scooting to [facility]: Patient Percentage: 0% Details for Bed Mobility Assistance: ? pt requiring increased A to come to A 2/2 cognitive deficits.   Transfers Transfers: Sit to Stand;Stand to Sit Sit to Stand: 1: +2 Total assist;From bed Sit to Stand: Patient Percentage: 40% Stand to Sit: 1: +2 Total assist;To bed Stand to Sit: Patient  Percentage: 40% Details for Transfer Assistance: pt does participate with sit to stand, but remains somewhat crouched.   Ambulation/Gait Ambulation/Gait Assistance: Not tested (comment) Stairs: No Wheelchair Mobility Wheelchair Mobility: No Modified Rankin (Stroke Patients Only) Pre-Morbid Rankin Score: Moderate disability Modified Rankin: Severe disability    Exercises     PT Diagnosis: Difficulty walking;Generalized weakness  PT Problem List: Decreased strength;Decreased activity tolerance;Decreased balance;Decreased mobility;Decreased coordination;Decreased cognition;Decreased knowledge of use of DME;Decreased safety awareness PT Treatment Interventions: DME instruction;Gait training;Stair training;Functional mobility training;Therapeutic activities;Therapeutic exercise;Balance training;Neuromuscular re-education;Cognitive remediation;Patient/family education     PT Goals(Current goals can be found in the care plan section) Acute Rehab PT Goals Patient Stated Goal: None stated.   PT Goal Formulation: Patient unable to participate in goal setting Time For Goal Achievement: 07/09/13 Potential to Achieve Goals: Fair  Visit Information  Last PT Received On: 06/25/13 Assistance Needed: +2 PT/OT Co-Evaluation/Treatment: Yes History of Present Illness: Pt admitted with falls and weakness with inability to walk.  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  MRI is without acute changes, display global atrophy.         Prior Functioning  Home Living Family/patient expects to be discharged to:: Skilled nursing facility Living Arrangements: Spouse/significant other Prior Function Comments: No family available to report PLOF. Communication Communication: Expressive difficulties (slurred speech) Dominant Hand: Right    Cognition  Cognition Arousal/Alertness: Lethargic Behavior During Therapy: Flat affect Overall Cognitive Status: No  family/caregiver present to determine baseline cognitive functioning (baseline dementia, oriented to self, not place,time or situa)    Extremity/Trunk Assessment Upper Extremity Assessment Upper Extremity Assessment: Defer to OT evaluation RUE Deficits / Details: 2/5 shoulder, at least 3/5 elbow to hand per observation LUE Deficits / Details: 2/5 shoulder, at least 3/5 elbow to hand per observation Lower Extremity Assessment Lower Extremity Assessment: RLE deficits/detail;Generalized weakness;Difficult to assess due to impaired cognition RLE Deficits / Details: pt generally weak, but R LE grossly 4-/5.     Balance Balance Balance Assessed: Yes Static Sitting Balance Static Sitting - Balance Support: Feet supported;Feet unsupported Static Sitting - Level of Assistance: 2: Max assist Static Sitting - Comment/# of Minutes: Sat EOB ~62mins with strong posterior lean.    End of Session PT - End of Session Equipment Utilized During Treatment: Gait belt Activity Tolerance: Patient tolerated treatment well Patient left: in bed;with call bell/phone within reach;with bed alarm set Nurse Communication: Mobility status  GP     Sunny Schlein,  [phone] 06/25/2013, 2:33 PM

## 2013-06-25 NOTE — Evaluation (Signed)
Occupational Therapy Evaluation Patient Details Name: [name] MRN: [id number] DOB: September 12, 1945 Today's Date: 06/25/2013 Time: 1310-1330 OT Time Calculation (min): 20 min  OT Assessment / Plan / Recommendation History of present illness Pt admitted with falls and weakness with inability to walk.  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  MRI is without acute changes, displays global atrophy.     Clinical Impression   Pt presents with cognitive deficits, lethargy, dependence in all mobility and in ADL.  No family available.  Recommending SNF as pt requires +2 assist for OOB.    OT Assessment  Patient needs continued OT Services    Follow Up Recommendations  SNF    Barriers to Discharge      Equipment Recommendations  None recommended by OT    Recommendations for Other Services    Frequency  Min 2X/week    Precautions / Restrictions Precautions Precautions: Fall Restrictions Weight Bearing Restrictions: No   Pertinent Vitals/Pain No pain, VSS    ADL  Grooming: Wash/dry face;Supervision/safety Where Assessed - Grooming: Supported sitting Upper Body Bathing: +1 Total assistance Where Assessed - Upper Body Bathing: Supported sitting Lower Body Bathing: +2 Total assistance Lower Body Bathing: Patient Percentage: 0% Where Assessed - Lower Body Bathing: Unsupported sitting;Supported sit to stand Upper Body Dressing: Maximal assistance Where Assessed - Upper Body Dressing: Supported sitting Lower Body Dressing: +2 Total assistance Lower Body Dressing: Patient Percentage: 0% Where Assessed - Lower Body Dressing: Unsupported sitting;Supported sit to stand Equipment Used: Gait belt    OT Diagnosis: Generalized weakness;Cognitive deficits;Altered mental status  OT Problem List: Decreased strength;Decreased activity tolerance;Impaired balance (sitting and/or standing);Decreased cognition;Decreased safety awareness;Impaired UE  functional use OT Treatment Interventions: Self-care/ADL training;DME and/or AE instruction;Balance training;Patient/family education   OT Goals(Current goals can be found in the care plan section) Acute Rehab OT Goals Patient Stated Goal: None stated.   OT Goal Formulation: Patient unable to participate in goal setting Time For Goal Achievement: 07/09/13 Potential to Achieve Goals: Good ADL Goals Pt Will Perform Eating: with supervision;sitting Pt Will Perform Grooming: with supervision;sitting Pt Will Transfer to Toilet: with mod assist;bedside commode Pt Will Perform Toileting - Clothing Manipulation and hygiene: with mod assist;sit to/from stand Additional ADL Goal #1: Pt will sit EOB unsupported x 10 minutes with supervision in preparation for ADL.  Visit Information  Last OT Received On: 06/25/13 Assistance Needed: +2 PT/OT Co-Evaluation/Treatment: Yes History of Present Illness: Pt admitted with falls and weakness with inability to walk.  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  MRI is without acute changes, display global atrophy.         Prior Functioning     Home Living Family/patient expects to be discharged to:: Skilled nursing facility Living Arrangements: Spouse/significant other Prior Function Comments: No family available to report PLOF. Communication Communication: Expressive difficulties (slurred speech) Dominant Hand: Right         Vision/Perception     Cognition  Cognition Arousal/Alertness: Lethargic Behavior During Therapy: Flat affect Overall Cognitive Status: No family/caregiver present to determine baseline cognitive functioning (baseline dementia, oriented to self, not place,time or situa)    Extremity/Trunk Assessment Upper Extremity Assessment Upper Extremity Assessment: Defer to OT evaluation RUE Deficits / Details: 2/5 shoulder, at least 3/5 elbow to hand per observation LUE Deficits /  Details: 2/5 shoulder, at least 3/5 elbow to hand per observation Lower Extremity Assessment Lower Extremity  Assessment: RLE deficits/detail;Generalized weakness;Difficult to assess due to impaired cognition RLE Deficits / Details: pt generally weak, but R LE grossly 4-/5.       Mobility Bed Mobility Bed Mobility: Supine to Sit;Sit to Supine;Sitting - Scoot to [place] of Bed;Scooting to HOB Supine to Sit: 1: +2 Total assist;HOB elevated Supine to Sit: Patient Percentage: 0% Sitting - Scoot to Edge of Bed: 1: +2 Total assist Sitting - Scoot to Edge of Bed: Patient Percentage: 0% Sit to Supine: 1: +2 Total assist;HOB flat Sit to Supine: Patient Percentage: 20% Scooting to HOB: 1: +2 Total assist Scooting to [facility]: Patient Percentage: 0% Details for Bed Mobility Assistance: ? pt requiring increased A to come to A 2/2 cognitive deficits.   Transfers Transfers: Sit to Stand;Stand to Sit Sit to Stand: 1: +2 Total assist;From bed Sit to Stand: Patient Percentage: 40% Stand to Sit: 1: +2 Total assist;To bed Stand to Sit: Patient Percentage: 40% Details for Transfer Assistance: pt does participate with sit to stand, but remains somewhat crouched.       Exercise     Balance Balance Balance Assessed: Yes Static Sitting Balance Static Sitting - Balance Support: Feet supported;Feet unsupported Static Sitting - Level of Assistance: 2: Max assist Static Sitting - Comment/# of Minutes: Sat EOB ~22mins with strong posterior lean.     End of Session OT - End of Session Activity Tolerance: Patient limited by lethargy Patient left: in bed;with call bell/phone within reach;with bed alarm set Nurse Communication: Mobility status  GO     Evern Bio 06/25/2013, 3:20 PM [phone]

## 2013-06-25 NOTE — Progress Notes (Signed)
[facility]                                                                                Patient Demographics  [name], is a 67 y.o. male, DOB - June 06, 1946, ZOX:[id number]  Admit date - 06/24/2013   Admitting Physician Lynden Oxford, MD  Outpatient Primary MD for the patient is [name], MD  LOS - 1   Chief Complaint  Patient presents with  . 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  PT and OT consulted. -Continue Aggrenox, and lipitor  HTN -Controlled, on no medications at this time.  Afib -Rate controlled  Seizure Disorder -Continue keppra, luminal, depakote  Dementia Continue namenda,   Code Status: Full  Family Communication: None at this time.  Disposition Plan: Admitted.    Procedures  Carotid Doppler  Consults   Neuro  DVT Prophylaxis  Lovenox   Lab Results  Component Value Date   PLT 180 06/25/2013    Medications  Scheduled Meds: . atorvastatin  40 mg Oral q1800  . dipyridamole-aspirin  1 capsule Oral BID  . divalproex  250 mg Oral Daily  . enoxaparin (LOVENOX) injection  40 mg Subcutaneous Q24H  . escitalopram  20 mg Oral Daily  . galantamine  8 mg Oral BID WC  . levETIRAcetam  500 mg Oral Q12H  . memantine  10 mg Oral BID  . PHENobarbital  32.4 mg Oral BID  . QUEtiapine  50 mg Oral QHS   Continuous Infusions:  PRN Meds:.  Antibiotics   Anti-infectives   None     Time Spent in minutes   30 minutes   Perline Awe D.O. on 06/25/2013 at 2:56 PM  Between 7am to 7pm - Pager - [phone]  After 7pm go to  [email or web address] - password [email or web address]  And look for the night coverage person covering for me after hours  [facility] Office  [phone]    Subjective:   [name] seen and examined today.   Patient has no complaints.  Patient denies dizziness, chest pain, shortness of breath, abdominal pain, N/V/D/C, new weakness, numbess, tingling.    Objective:   Filed Vitals:   06/25/13 0228 06/25/13 0552 06/25/13 1108 06/25/13 1421  BP: 117/91 113/76 104/80 106/80  Pulse: 81 90 78 73  Temp: 97.7 F (36.5 C) 97.4 F (36.3 C) 97.6 F (36.4 C) 97.5 F (36.4 C)  TempSrc: Oral Oral Oral Oral  Resp: 20 20 20 20   Height:      Weight:      SpO2: 96% 98% 97% 95%    Wt Readings from Last 3 Encounters:  06/24/13 81.92 kg (180 lb 9.6 oz)  05/27/13 82.101 kg (181 lb)  04/13/13 77.111 kg (170 lb)  No intake or output data in the 24 hours ending 06/25/13 1456  Exam  General: Well developed, well nourished, NAD, appears stated age  HEENT: NCAT, PERRLA, EOMI, Anicteic Sclera, mucous membranes moist. No pharyngeal erythema or exudates  Neck: Supple, no JVD, no masses  Cardiovascular: S1 S2 auscultated, 2/6 SEM  Respiratory: Clear to auscultation bilaterally with equal chest rise  Abdomen: Soft, nontender, nondistended, + bowel sounds  Extremities: warm dry without cyanosis clubbing or edema  Neuro: AAOx3, cranial nerves grossly intact. Strength 5/5 in patient's upper and lower extremities bilaterally  Skin: Without rashes exudates or nodules  Data Review   Micro Results No results found for this or any previous visit (from the past 240 hour(s)).  Radiology Reports Dg Chest 2 View  06/24/2013   CLINICAL DATA:  History of hypertension, stroke, seizures and myocardial infarction, fell today  EXAM: CHEST  2 VIEW  COMPARISON:  05/27/2013  FINDINGS: Moderate cardiomegaly. Status post CABG. Uncoiled aorta. Vascular pattern normal. Lungs clear. No pleural effusions. Bony  thorax is intact.  IMPRESSION: No acute findings.   Electronically Signed   By: Esperanza Heir M.D.   On: 06/24/2013 19:08   Dg Chest 2 View  05/27/2013   CLINICAL DATA:  Altered mental status. Hypertension.  EXAM: CHEST  2 VIEW  COMPARISON:  01/11/2013  FINDINGS: Cardiomegaly. Prior CABG. Calcified tortuous aorta. No infiltrates or failure. No effusion or pneumothorax. Negative osseous structures. Stable from priors.  IMPRESSION: Cardiomegaly with no acute cardiopulmonary process identified.   Electronically Signed   By: Davonna Belling M.D.   On: 05/27/2013 22:47   Dg Pelvis 1-2 Views  06/24/2013   CLINICAL DATA:  Patient fell today and landed on buttocks  EXAM: PELVIS - 1-2 VIEW  COMPARISON:  None.  FINDINGS: There is no evidence of pelvic fracture or diastasis. No other pelvic bone lesions are seen. There is a 1.3 cm focus of foreign body material projecting over the inner wall of the right acetabulum of uncertain origin.  IMPRESSION: No fracture. Foreign body of uncertain origin on the right. It is unlikely to be of acute significance.   Electronically Signed   By: Esperanza Heir M.D.   On: 06/24/2013 19:07   Ct Head Wo Contrast  06/24/2013   CLINICAL DATA:  Patient fell last week and has fallen 6 more times since then  EXAM: CT HEAD WITHOUT CONTRAST  TECHNIQUE: Contiguous axial images were obtained from the base of the skull through the vertex without intravenous contrast.  COMPARISON:  06/17/2013  FINDINGS: The calvarium is intact with no skull fracture. No significant paranasal sinus inflammatory change. There is moderate diffuse atrophy. No focal attenuation abnormality to suggest vascular territory infarct. Small area of low attenuation left anterior pons, possibly representing lacunar infarct. No hemorrhage or extra-axial fluid. Low attenuation in the deep white matter consistent with chronic small vessel ischemic change.  IMPRESSION: No acute traumatic injury. Chronic involutional change.  Possible tiny lacunar pontine infarct, age uncertain.   Electronically Signed   By: Esperanza Heir M.D.   On: 06/24/2013 19:03   Ct Head Wo Contrast  06/17/2013   CLINICAL DATA:  The patient fell in the driveway. Confusion over the last 2 days. Recent falls. Unable to stand.  EXAM: CT HEAD WITHOUT CONTRAST  TECHNIQUE: Contiguous axial images were obtained from the base of the skull through the vertex without intravenous contrast.  COMPARISON:  05/18/2013  FINDINGS: There is moderate central and cortical atrophy. Periventricular white matter  changes are consistent with small vessel disease. There is no evidence for hemorrhage, mass lesion, or acute infarction. There is atherosclerotic calcification of the internal carotid arteries. No calvarial fracture or paranasal sinus disease.  IMPRESSION: 1. Atrophy and small vessel disease. 2.  No evidence for acute intracranial abnormality.   Electronically Signed   By: Rosalie Gums M.D.   On: 06/17/2013 15:30   Mr Brain Wo Contrast  06/25/2013   CLINICAL DATA:  Ataxia. Confusion. History of stroke, seizures, hypertension and dementia.  EXAM: MRI HEAD WITHOUT CONTRAST  MRA HEAD WITHOUT CONTRAST  TECHNIQUE: Multiplanar, multiecho pulse sequences of the brain and surrounding structures were obtained without intravenous contrast. Angiographic images of the head were obtained using MRA technique without contrast.  COMPARISON:  06/24/2013 CT. 07/03/2011 and 08/01/2010 MR.  FINDINGS: MRI HEAD FINDINGS  Exam is motion degraded.  No acute infarct.  Remote posterior right frontal lobe infarct. Remote tiny cerebellar infarcts. Remote coronal radiata/ basal ganglia infarcts/prominent small vessel disease type changes. Small vessel disease type changes of the pons.  Within the right uncus, 5 mm lesion has an appearance suggestive of a small cavernoma and is slightly more conspicuous than on prior examinations.  No other evidence of intracranial mass/ hemorrhage.  Global atrophy.  The degree of ventricular prominence may be related to atrophy although difficult to exclude a mild component of hydrocephalus. Appearance is without significant change.  Mild exophthalmos.  MRA HEAD FINDINGS  Moderate narrowing petrous segment of the right internal carotid artery. Moderate to marked narrowing pre cavernous segment right internal carotid artery. High-grade stenosis at the junction of the cavernous and supraclinoid aspect of the right internal carotid artery.  Moderate to marked narrowing cavernous segment left internal carotid artery.  Aplastic A1 segment right anterior cerebral artery.  Ectatic M1 segment middle cerebral artery bilaterally.  Middle cerebral artery moderate branch vessel irregularity bilaterally with decrease number of visualized right middle cerebral artery branches consistent with patient's remote infarct.  Mild narrowing A2 segment left anterior cerebral artery.  Ectatic vertebral arteries bilaterally with moderate to marked narrowing distal left vertebral artery and moderate narrowing and irregularity distal right vertebral artery.  Moderate narrowing of the posterior inferior cerebellar artery bilaterally.  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  Moderate to marked narrowing mid aspect of the basilar artery.  Non visualization anterior inferior cerebellar arteries bilaterally.  Moderate narrowing distal basilar artery just proximal to the takeoff of the superior cerebral arteries.  Marked narrowing mid aspect left posterior cerebral artery. Moderate narrowing proximal and mid aspect right posterior cerebral artery. Narrowing of the posterior cerebral artery distal branches greater on the right.  IMPRESSION: Exam is motion degraded.  No acute infarct.  Remote posterior right frontal lobe infarct. Remote tiny cerebellar infarcts. Remote coronal radiata/ basal ganglia infarcts/prominent small vessel disease type changes. Small vessel disease type changes of the pons.   Within the right uncus, 5 mm lesion has an appearance suggestive of a small cavernoma and is slightly more conspicuous than on prior examinations.  Global atrophy. The degree of ventricular prominence may be related to atrophy although difficult to exclude a mild component of hydrocephalus. Appearance is without significant change.  Progressive significant intracranial atherosclerotic type changes as detailed above.   Electronically Signed   By: Bridgett Larsson M.D.   On: 06/25/2013 08:49   Mr Maxine Glenn Head/brain Wo Cm  06/25/2013   CLINICAL DATA:  Ataxia. Confusion. History of stroke, seizures, hypertension and dementia.  EXAM: MRI  HEAD WITHOUT CONTRAST  MRA HEAD WITHOUT CONTRAST  TECHNIQUE: Multiplanar, multiecho pulse sequences of the brain and surrounding structures were obtained without intravenous contrast. Angiographic images of the head were obtained using MRA technique without contrast.  COMPARISON:  06/24/2013 CT. 07/03/2011 and 08/01/2010 MR.  FINDINGS: MRI HEAD FINDINGS  Exam is motion degraded.  No acute infarct.  Remote posterior right frontal lobe infarct. Remote tiny cerebellar infarcts. Remote coronal radiata/ basal ganglia infarcts/prominent small vessel disease type changes. Small vessel disease type changes of the pons.  Within the right uncus, 5 mm lesion has an appearance suggestive of a small cavernoma and is slightly more conspicuous than on prior examinations.  No other evidence of intracranial mass/ hemorrhage.  Global atrophy. The degree of ventricular prominence may be related to atrophy although difficult to exclude a mild component of hydrocephalus. Appearance is without significant change.  Mild exophthalmos.  MRA HEAD FINDINGS  Moderate narrowing petrous segment of the right internal carotid artery. Moderate to marked narrowing pre cavernous segment right internal carotid artery. High-grade stenosis at the junction of the cavernous and supraclinoid aspect of the right internal carotid  artery.  Moderate to marked narrowing cavernous segment left internal carotid artery.  Aplastic A1 segment right anterior cerebral artery.  Ectatic M1 segment middle cerebral artery bilaterally.  Middle cerebral artery moderate branch vessel irregularity bilaterally with decrease number of visualized right middle cerebral artery branches consistent with patient's remote infarct.  Mild narrowing A2 segment left anterior cerebral artery.  Ectatic vertebral arteries bilaterally with moderate to marked narrowing distal left vertebral artery and moderate narrowing and irregularity distal right vertebral artery.  Moderate narrowing of the posterior inferior cerebellar artery bilaterally.  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  Moderate to marked narrowing mid aspect of the basilar artery.  Non visualization anterior inferior cerebellar arteries bilaterally.  Moderate narrowing distal basilar artery just proximal to the takeoff of the superior cerebral arteries.  Marked narrowing mid aspect left posterior cerebral artery. Moderate narrowing proximal and mid aspect right posterior cerebral artery. Narrowing of the posterior cerebral artery distal branches greater on the right.  IMPRESSION: Exam is motion degraded.  No acute infarct.  Remote posterior right frontal lobe infarct. Remote tiny cerebellar infarcts. Remote coronal radiata/ basal ganglia infarcts/prominent small vessel disease type changes. Small vessel disease type changes of the pons.  Within the right uncus, 5 mm lesion has an appearance suggestive of a small cavernoma and is slightly more conspicuous than on prior examinations.  Global atrophy. The degree of ventricular prominence may be related to atrophy although difficult to exclude a mild component of hydrocephalus. Appearance is without significant change.  Progressive significant intracranial atherosclerotic type changes as detailed above.   Electronically Signed   By: Bridgett Larsson M.D.   On:  06/25/2013 08:49    CBC  Recent Labs Lab 06/24/13 1800 06/25/13 0515  WBC 12.2* 11.2*  HGB 15.1 15.2  HCT 44.6 44.8  PLT 176 180  MCV 93.1 93.5  MCH 31.5 31.7  MCHC 33.9 33.9  RDW 15.2 15.3  LYMPHSABS  --  2.6  MONOABS  --  0.9  EOSABS  --  0.1  BASOSABS  --  0.0    Chemistries   Recent Labs Lab 06/24/13 1520 06/25/13 0515  NA 141 144  K 4.1 3.6  CL 102 104  CO2 23 27  GLUCOSE 98 80  BUN 18 17  CREATININE 1.00 1.09  CALCIUM 10.6* 9.6  AST  --  19  ALT  --  9  ALKPHOS  --  106  BILITOT  --  0.4   ------------------------------------------------------------------------------------------------------------------ estimated creatinine clearance is 66.7 ml/min (by C-G formula based on Cr of 1.09). ------------------------------------------------------------------------------------------------------------------  Recent Labs  06/25/13 0515  HGBA1C 5.9*   ------------------------------------------------------------------------------------------------------------------  Recent Labs  06/25/13 0515  CHOL 155  HDL 54  LDLCALC 83  TRIG 88  CHOLHDL 2.9   ------------------------------------------------------------------------------------------------------------------ No results found for this basename: TSH, T4TOTAL, FREET3, T3FREE, THYROIDAB,  in the last 72 hours ------------------------------------------------------------------------------------------------------------------  Recent Labs  06/25/13 0515  VITAMINB12 403  FOLATE >20.0    Coagulation profile  Recent Labs Lab 06/25/13 0515  INR 1.19    No results found for this basename: DDIMER,  in the last 72 hours  Cardiac Enzymes  Recent Labs Lab 06/24/13 1520 06/25/13 0515  TROPONINI <0.30 <0.30   ------------------------------------------------------------------------------------------------------------------ No components found with this basename: POCBNP,

## 2013-06-25 NOTE — ED Provider Notes (Signed)
I saw and evaluated the patient, reviewed the resident's note and I agree with the findings and plan.  EKG Interpretation     Ventricular Rate:  95 PR Interval:    QRS Duration: 132 QT Interval:  379 QTC Calculation: 476 R Axis:   27 Text Interpretation:  Atrial fibrillation Ventricular premature complex Right bundle branch block No old tracing to compare Vent rate 95            Admit pt for ataxia and weakness.  ER w/o negative except for lacunar infarct.  Further evaluation as an inpatient.    Darlys Gales, MD 06/25/13 [phone]

## 2013-06-25 NOTE — Progress Notes (Signed)
*  PRELIMINARY RESULTS* Vascular Ultrasound Carotid Duplex (Doppler) has been completed.   Findings suggest 1-39% internal carotid artery stenosis bilaterally. Vertebral arteries are patent with antegrade flow.  06/25/2013 10:41 AM Gertie Fey, RVT, RDCS, RDMS

## 2013-06-25 NOTE — Progress Notes (Signed)
*  PRELIMINARY RESULTS* Echocardiogram 2D Echocardiogram has been performed.  [name] 06/25/2013, 12:23 PM

## 2013-06-25 NOTE — Progress Notes (Signed)
Around 2240, RN bladder scanned pt with result of 190 cc's. Pt denies discomfort and does not feel the need to void. Pt has not voided since being in and out catherized around 1412 today. Pt has also developed a wet cough and is having trouble staying awake long enough to swallow medications. Tama Gander, NP was paged regarding pt's condition. 500cc bolus of NS ordered to be given. Pt is to be kept NPO at this time until speech therapy can evaluate him. RN also informed NP that pt's home meds Seroquel and Luminal were held because of pt's current drowsiness. Will continue to monitor pt's hydration status and lungs. Salvadore Oxford, RN 06/25/13 [phone]

## 2013-06-26 DIAGNOSIS — F0281 Dementia in other diseases classified elsewhere with behavioral disturbance: Secondary | ICD-10-CM

## 2013-06-26 DIAGNOSIS — F0280 Dementia in other diseases classified elsewhere without behavioral disturbance: Secondary | ICD-10-CM

## 2013-06-26 LAB — GLUCOSE, CAPILLARY: Glucose-Capillary: 113 mg/dL — ABNORMAL HIGH (ref 70–99)

## 2013-06-26 MED ORDER — INFLUENZA VAC SPLIT QUAD 0.5 ML IM SUSP
0.5000 mL | INTRAMUSCULAR | Status: AC
  Administered 2013-06-27: 0.5 mL via INTRAMUSCULAR
  Filled 2013-06-26: qty 0.5

## 2013-06-26 NOTE — Progress Notes (Signed)
[facility]                                                                                Patient Demographics  [name], is a 67 y.o. male, DOB - 1945/10/15, ZOX:[id number]  Admit date - 06/24/2013   Admitting Physician Lynden Oxford, MD  Outpatient Primary MD for the patient is [name], MD  LOS - 2   Chief Complaint  Patient presents with  . Fall      Interim history History of hypertension, multiple sports and seizure disorder presented to emergency for recurrent falls. Patient also has underlying dementia. He was currently living at home. At this time negative workup. PT and OT had been following the patient as well as neurology. Patient will need a nursing home placement.  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  LV function normal. -Neurology consulted and following.  PT and OT consulted. -Continue Aggrenox, and lipitor  HTN -Controlled, on no medications at this time.  Afib -Rate controlled  Seizure Disorder -Continue keppra, luminal, depakote  Dementia Continue namenda,   Code Status: Full  Family Communication: None at this time.  Disposition Plan: Admitted.    Procedures  Carotid Doppler Findings suggest 1-39% internal carotid artery stenosis bilaterally. The right ICA/CCA ratio is suggestive of 60-79% stenosis, howeverthere is no significant plaque morphology to support stenosis. Vertebral arteries are patent with antegrade flow.  Echo Study Conclusions - Left ventricle: The cavity size was normal. There was mild concentric hypertrophy. Systolic function was normal. The   estimated ejection fraction was in the range of 60% to 65%. Although no diagnostic regional wall motion abnormality was identified, this possibility cannot be completely excluded on the basis of this study. Doppler  parameters are consistent with abnormal left ventricular relaxation (grade 1 diastolic dysfunction). - Aortic valve: Trivial regurgitation. - Right ventricle: The cavity size was mildly dilated. Wall thickness was normal. Impressions:No cardiac source of emboli was indentified.  Consults   Neuro  DVT Prophylaxis  Lovenox   Lab Results  Component Value Date   PLT 180 06/25/2013    Medications  Scheduled Meds: . atorvastatin  40 mg Oral q1800  . dipyridamole-aspirin  1 capsule Oral BID  . divalproex  250 mg Oral Daily  . enoxaparin (LOVENOX) injection  40 mg Subcutaneous Q24H  . escitalopram  20 mg Oral Daily  . galantamine  8 mg Oral BID WC  . [START ON 06/27/2013] influenza vac split quadrivalent PF  0.5 mL Intramuscular Tomorrow-1000  . levETIRAcetam  500 mg Oral Q12H  . memantine  10 mg Oral BID  . PHENobarbital  32.4 mg Oral BID  . QUEtiapine  50 mg Oral QHS   Continuous Infusions:  PRN Meds:.  Antibiotics   Anti-infectives   None     Time  Spent in minutes   20 minutes   Maysel Mccolm D.O. on 06/26/2013 at 11:29 AM  Between 7am to 7pm - Pager - [phone]  After 7pm go to [email or web address] - password [email or web address]  And look for the night coverage person covering for me after hours  [facility] Office  [phone]    Subjective:   [name] seen and examined today.   Patient has no complaints.  Patient denies dizziness, chest pain, shortness of breath, abdominal pain, N/V/D/C, new weakness, numbess, tingling.    Objective:   Filed Vitals:   06/25/13 2148 06/26/13 0204 06/26/13 0500 06/26/13 0934  BP: 127/73 125/73 111/69 129/76  Pulse: 76 114 68 88  Temp: 98.1 F (36.7 C) 98 F (36.7 C) 97.5 F (36.4 C) 97.2 F (36.2 C)   TempSrc: Oral Oral Oral Oral  Resp: 18 20 20 20   Height:      Weight:      SpO2: 96% 96% 98% 93%    Wt Readings from Last 3 Encounters:  06/24/13 81.92 kg (180 lb 9.6 oz)  05/27/13 82.101 kg (181 lb)  04/13/13 77.111 kg (170 lb)     Intake/Output Summary (Last 24 hours) at 06/26/13 1129 Last data filed at 06/26/13 0538  Gross per 24 hour  Intake      0 ml  Output    225 ml  Net   -225 ml    Exam  General: Well developed, well nourished, NAD, appears stated age  HEENT: NCAT, PERRLA, EOMI, Anicteic Sclera, mucous membranes moist. No pharyngeal erythema or exudates  Neck: Supple, no JVD, no masses  Cardiovascular: S1 S2 auscultated, 2/6 SEM  Respiratory: Clear to auscultation bilaterally with equal chest rise  Abdomen: Soft, nontender, nondistended, + bowel sounds  Extremities: warm dry without cyanosis clubbing or edema  Neuro: AAOx3, cranial nerves grossly intact. Strength 5/5 in patient's upper and lower extremities bilaterally  Skin: Without rashes exudates or nodules  Data Review   Micro Results No results found for this or any previous visit (from the past 240 hour(s)).  Radiology Reports Dg Chest 2 View  06/24/2013   CLINICAL DATA:  History of hypertension, stroke, seizures and myocardial infarction, fell today  EXAM: CHEST  2 VIEW  COMPARISON:  05/27/2013  FINDINGS: Moderate cardiomegaly. Status post CABG. Uncoiled aorta. Vascular pattern normal. Lungs clear. No pleural effusions. Bony thorax is intact.  IMPRESSION: No acute findings.   Electronically Signed   By: Esperanza Heir M.D.   On: 06/24/2013 19:08   Dg Chest 2 View  05/27/2013   CLINICAL DATA:  Altered mental status. Hypertension.  EXAM: CHEST  2 VIEW  COMPARISON:  01/11/2013  FINDINGS: Cardiomegaly. Prior CABG. Calcified tortuous aorta. No infiltrates or failure. No effusion or pneumothorax. Negative osseous structures. Stable from priors.  IMPRESSION: Cardiomegaly with no acute cardiopulmonary  process identified.   Electronically Signed   By: Davonna Belling M.D.   On: 05/27/2013 22:47   Dg Pelvis 1-2 Views  06/24/2013   CLINICAL DATA:  Patient fell today and landed on buttocks  EXAM: PELVIS - 1-2 VIEW  COMPARISON:  None.  FINDINGS: There is no evidence of pelvic fracture or diastasis. No other pelvic bone lesions are seen. There is a 1.3 cm focus of foreign body material projecting over the inner wall of the right acetabulum of uncertain origin.  IMPRESSION: No fracture. Foreign body of uncertain origin on the right. It is unlikely to be of acute significance.   Electronically  Signed   By: Esperanza Heir M.D.   On: 06/24/2013 19:07   Ct Head Wo Contrast  06/24/2013   CLINICAL DATA:  Patient fell last week and has fallen 6 more times since then  EXAM: CT HEAD WITHOUT CONTRAST  TECHNIQUE: Contiguous axial images were obtained from the base of the skull through the vertex without intravenous contrast.  COMPARISON:  06/17/2013  FINDINGS: The calvarium is intact with no skull fracture. No significant paranasal sinus inflammatory change. There is moderate diffuse atrophy. No focal attenuation abnormality to suggest vascular territory infarct. Small area of low attenuation left anterior pons, possibly representing lacunar infarct. No hemorrhage or extra-axial fluid. Low attenuation in the deep white matter consistent with chronic small vessel ischemic change.  IMPRESSION: No acute traumatic injury. Chronic involutional change. Possible tiny lacunar pontine infarct, age uncertain.   Electronically Signed   By: Esperanza Heir M.D.   On: 06/24/2013 19:03   Ct Head Wo Contrast  06/17/2013   CLINICAL DATA:  The patient fell in the driveway. Confusion over the last 2 days. Recent falls. Unable to stand.  EXAM: CT HEAD WITHOUT CONTRAST  TECHNIQUE: Contiguous axial images were obtained from the base of the skull through the vertex without intravenous contrast.  COMPARISON:  05/18/2013  FINDINGS: There is  moderate central and cortical atrophy. Periventricular white matter changes are consistent with small vessel disease. There is no evidence for hemorrhage, mass lesion, or acute infarction. There is atherosclerotic calcification of the internal carotid arteries. No calvarial fracture or paranasal sinus disease.  IMPRESSION: 1. Atrophy and small vessel disease. 2.  No evidence for acute intracranial abnormality.   Electronically Signed   By: Rosalie Gums M.D.   On: 06/17/2013 15:30   Mr Brain Wo Contrast  06/25/2013   CLINICAL DATA:  Ataxia. Confusion. History of stroke, seizures, hypertension and dementia.  EXAM: MRI HEAD WITHOUT CONTRAST  MRA HEAD WITHOUT CONTRAST  TECHNIQUE: Multiplanar, multiecho pulse sequences of the brain and surrounding structures were obtained without intravenous contrast. Angiographic images of the head were obtained using MRA technique without contrast.  COMPARISON:  06/24/2013 CT. 07/03/2011 and 08/01/2010 MR.  FINDINGS: MRI HEAD FINDINGS  Exam is motion degraded.  No acute infarct.  Remote posterior right frontal lobe infarct. Remote tiny cerebellar infarcts. Remote coronal radiata/ basal ganglia infarcts/prominent small vessel disease type changes. Small vessel disease type changes of the pons.  Within the right uncus, 5 mm lesion has an appearance suggestive of a small cavernoma and is slightly more conspicuous than on prior examinations.  No other evidence of intracranial mass/ hemorrhage.  Global atrophy. The degree of ventricular prominence may be related to atrophy although difficult to exclude a mild component of hydrocephalus. Appearance is without significant change.  Mild exophthalmos.  MRA HEAD FINDINGS  Moderate narrowing petrous segment of the right internal carotid artery. Moderate to marked narrowing pre cavernous segment right internal carotid artery. High-grade stenosis at the junction of the cavernous and supraclinoid aspect of the right internal carotid artery.   Moderate to marked narrowing cavernous segment left internal carotid artery.  Aplastic A1 segment right anterior cerebral artery.  Ectatic M1 segment middle cerebral artery bilaterally.  Middle cerebral artery moderate branch vessel irregularity bilaterally with decrease number of visualized right middle cerebral artery branches consistent with patient's remote infarct.  Mild narrowing A2 segment left anterior cerebral artery.  Ectatic vertebral arteries bilaterally with moderate to marked narrowing distal left vertebral artery and moderate narrowing and irregularity distal  right vertebral artery.  Moderate narrowing of the posterior inferior cerebellar artery bilaterally.  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  Moderate to marked narrowing mid aspect of the basilar artery.  Non visualization anterior inferior cerebellar arteries bilaterally.  Moderate narrowing distal basilar artery just proximal to the takeoff of the superior cerebral arteries.  Marked narrowing mid aspect left posterior cerebral artery. Moderate narrowing proximal and mid aspect right posterior cerebral artery. Narrowing of the posterior cerebral artery distal branches greater on the right.  IMPRESSION: Exam is motion degraded.  No acute infarct.  Remote posterior right frontal lobe infarct. Remote tiny cerebellar infarcts. Remote coronal radiata/ basal ganglia infarcts/prominent small vessel disease type changes. Small vessel disease type changes of the pons.  Within the right uncus, 5 mm lesion has an appearance suggestive of a small cavernoma and is slightly more conspicuous than on prior examinations.  Global atrophy. The degree of ventricular prominence may be related to atrophy although difficult to exclude a mild component of hydrocephalus. Appearance is without significant change.  Progressive significant intracranial atherosclerotic type changes as detailed above.   Electronically Signed   By: Bridgett Larsson M.D.   On:  06/25/2013 08:49   Mr Maxine Glenn Head/brain Wo Cm  06/25/2013   CLINICAL DATA:  Ataxia. Confusion. History of stroke, seizures, hypertension and dementia.  EXAM: MRI HEAD WITHOUT CONTRAST  MRA HEAD WITHOUT CONTRAST  TECHNIQUE: Multiplanar, multiecho pulse sequences of the brain and surrounding structures were obtained without intravenous contrast. Angiographic images of the head were obtained using MRA technique without contrast.  COMPARISON:  06/24/2013 CT. 07/03/2011 and 08/01/2010 MR.  FINDINGS: MRI HEAD FINDINGS  Exam is motion degraded.  No acute infarct.  Remote posterior right frontal lobe infarct. Remote tiny cerebellar infarcts. Remote coronal radiata/ basal ganglia infarcts/prominent small vessel disease type changes. Small vessel disease type changes of the pons.  Within the right uncus, 5 mm lesion has an appearance suggestive of a small cavernoma and is slightly more conspicuous than on prior examinations.  No other evidence of intracranial mass/ hemorrhage.  Global atrophy. The degree of ventricular prominence may be related to atrophy although difficult to exclude a mild component of hydrocephalus. Appearance is without significant change.  Mild exophthalmos.  MRA HEAD FINDINGS  Moderate narrowing petrous segment of the right internal carotid artery. Moderate to marked narrowing pre cavernous segment right internal carotid artery. High-grade stenosis at the junction of the cavernous and supraclinoid aspect of the right internal carotid artery.  Moderate to marked narrowing cavernous segment left internal carotid artery.  Aplastic A1 segment right anterior cerebral artery.  Ectatic M1 segment middle cerebral artery bilaterally.  Middle cerebral artery moderate branch vessel irregularity bilaterally with decrease number of visualized right middle cerebral artery branches consistent with patient's remote infarct.  Mild narrowing A2 segment left anterior cerebral artery.  Ectatic vertebral arteries  bilaterally with moderate to marked narrowing distal left vertebral artery and moderate narrowing and irregularity distal right vertebral artery.  Moderate narrowing of the posterior inferior cerebellar artery bilaterally.  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  Moderate to marked narrowing mid aspect of the basilar artery.  Non visualization anterior inferior cerebellar arteries bilaterally.  Moderate narrowing distal basilar artery just proximal to the takeoff of the superior cerebral arteries.  Marked narrowing mid aspect left posterior cerebral artery. Moderate narrowing proximal and mid aspect right posterior cerebral artery. Narrowing of the posterior cerebral artery distal branches greater on the right.  IMPRESSION: Exam is  motion degraded.  No acute infarct.  Remote posterior right frontal lobe infarct. Remote tiny cerebellar infarcts. Remote coronal radiata/ basal ganglia infarcts/prominent small vessel disease type changes. Small vessel disease type changes of the pons.  Within the right uncus, 5 mm lesion has an appearance suggestive of a small cavernoma and is slightly more conspicuous than on prior examinations.  Global atrophy. The degree of ventricular prominence may be related to atrophy although difficult to exclude a mild component of hydrocephalus. Appearance is without significant change.  Progressive significant intracranial atherosclerotic type changes as detailed above.   Electronically Signed   By: Bridgett Larsson M.D.   On: 06/25/2013 08:49    CBC  Recent Labs Lab 06/24/13 1800 06/25/13 0515  WBC 12.2* 11.2*  HGB 15.1 15.2  HCT 44.6 44.8  PLT 176 180  MCV 93.1 93.5  MCH 31.5 31.7  MCHC 33.9 33.9  RDW 15.2 15.3  LYMPHSABS  --  2.6  MONOABS  --  0.9  EOSABS  --  0.1  BASOSABS  --  0.0    Chemistries   Recent Labs Lab 06/24/13 1520 06/25/13 0515  NA 141 144  K 4.1 3.6  CL 102 104  CO2 23 27  GLUCOSE 98 80  BUN 18 17  CREATININE 1.00 1.09  CALCIUM  10.6* 9.6  AST  --  19  ALT  --  9  ALKPHOS  --  106  BILITOT  --  0.4   ------------------------------------------------------------------------------------------------------------------ estimated creatinine clearance is 66.7 ml/min (by C-G formula based on Cr of 1.09). ------------------------------------------------------------------------------------------------------------------  Recent Labs  06/25/13 0515  HGBA1C 5.9*   ------------------------------------------------------------------------------------------------------------------  Recent Labs  06/25/13 0515  CHOL 155  HDL 54  LDLCALC 83  TRIG 88  CHOLHDL 2.9   ------------------------------------------------------------------------------------------------------------------ No results found for this basename: TSH, T4TOTAL, FREET3, T3FREE, THYROIDAB,  in the last 72 hours ------------------------------------------------------------------------------------------------------------------  Recent Labs  06/25/13 0515  VITAMINB12 403  FOLATE >20.0    Coagulation profile  Recent Labs Lab 06/25/13 0515  INR 1.19    No results found for this basename: DDIMER,  in the last 72 hours  Cardiac Enzymes  Recent Labs Lab 06/24/13 1520 06/25/13 0515  TROPONINI <0.30 <0.30   ------------------------------------------------------------------------------------------------------------------ No components found with this basename: POCBNP,

## 2013-06-26 NOTE — Care Management Note (Signed)
Patient is active with [facility]. HHPT

## 2013-06-26 NOTE — Progress Notes (Signed)
Patient was helped to the bathroom this evening with the stedy lift, made a small amount of urine about 150cc. Encouraged to drink a lot of fluids.will continue to monitor.
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
## 2013-06-27 DIAGNOSIS — W19XXXS Unspecified fall, sequela: Secondary | ICD-10-CM

## 2013-06-27 NOTE — Progress Notes (Signed)
0454 pt has not voided through out shift. Pt bladder scanned for more than and In & Out cath for . Pt urine was amber color with no strong odor noted. Pt remains in bed with call light with in reach. Will continue to monitor and pass on to the incoming RN.

## 2013-06-27 NOTE — Progress Notes (Signed)
Seen and agreed 06/27/2013 Chevonne Bostrom Elizabeth PTA [phone] pager [phone] office    

## 2013-06-27 NOTE — Progress Notes (Signed)
Physical Therapy Treatment Patient Details Name: Demetrious Rainford MRN: [id number] DOB: [birth year] Today's Date: 06/27/2013 Time: 1191-4782 PT Time Calculation (min): 23 min  PT Assessment / Plan / Recommendation  History of Present Illness Pt admitted with falls and weakness with inability to walk.  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  MRI is without acute changes, display global atrophy.     PT Comments   Pt required assistance with perianal care after attmepting himself; pt required assistance from writer to maintain standing balance and cues for erect posture. Pt transferred to recliner with cues for technique and physical assistance.  Continue with POC for d/c plan.  Follow Up Recommendations  SNF     Does the patient have the potential to tolerate intense rehabilitation     Barriers to Discharge        Equipment Recommendations  Rolling walker with 5" wheels    Recommendations for Other Services    Frequency Min 3X/week   Progress towards PT Goals Progress towards PT goals: Progressing toward goals  Plan Current plan remains appropriate    Precautions / Restrictions Precautions Precautions: Fall Restrictions Weight Bearing Restrictions: No   Pertinent Vitals/Pain no apparent distress     Mobility  Bed Mobility Bed Mobility: Not assessed Details for Bed Mobility Assistance: Pt received from 3n1 Transfers Transfers: Sit to Stand;Stand to Sit;Stand Pivot Transfers Sit to Stand: 1: +2 Total assist;From chair/3-in-1 Sit to Stand: Patient Percentage: 50% Stand to Sit: 1: +2 Total assist;To chair/3-in-1 Stand to Sit: Patient Percentage: 50% Stand Pivot Transfers: 1: +2 Total assist Stand Pivot Transfers: Patient Percentage: 40% Details for Transfer Assistance: Pt able to sit<>stand with assistance and repeated cues for technique.  Assistance moving L LE from 3n1 to recliner with standing pivot  transfer Ambulation/Gait Ambulation/Gait Assistance: Not tested (comment)    Exercises     PT Diagnosis:    PT Problem List:   PT Treatment Interventions:     PT Goals (current goals can now be found in the care plan section)    Visit Information  Last PT Received On: 06/27/13 Assistance Needed: +2 History of Present Illness: Pt admitted with falls and weakness with inability to walk.  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  MRI is without acute changes, display global atrophy.      Subjective Data      Cognition  Cognition Arousal/Alertness: Lethargic Behavior During Therapy: Flat affect Overall Cognitive Status: No family/caregiver present to determine baseline cognitive functioning    Balance     End of Session PT - End of Session Equipment Utilized During Treatment: Gait belt Activity Tolerance: Patient tolerated treatment well;Patient limited by lethargy Patient left: in chair;with call bell/phone within reach Nurse Communication: Mobility status   GP     Ernestina Columbia, SPTA 06/27/2013, 2:08 PM

## 2013-06-27 NOTE — Clinical Social Work Note (Signed)
CSW talked with wife regarding recommendation of SNF placement and she is in agreement (full assessment to follow). Her preferences are [facility] or [facility] in [place]. Mrs. Hughlett advised that she will be informed of facility responses on 11/13.

## 2013-06-27 NOTE — Progress Notes (Signed)
[facility] PROGRESS NOTE  [name] ZOX:[id number] DOB: 11-Sep-1945 DOA: 06/24/2013 PCP: [name], MD  Assessment/Plan:  Recurrently Falls, r/o CVA  -CT scan of head: negative for any acute abnormality  -MRI brain shows no acute infarct  -Carotid doppler: no ICA stenosis  -A1c 5.9, B12 normal, Folate normal  -Echo: No source of emboli. LV function normal.  -Neurology consulted and on last recommendation on 06/25/2013 they recommended PT for gait and strengthening and echocardiogram.  - PT and OT consulted.  -Continue Aggrenox, and lipitor  - Echocardiogram showed EF of 60-65% and grade 1 diastolic dysfunction - Recurrent falls based on negative workup while in the hospital is most likely what is suspected to be due to to deconditioning physical therapy has evaluated patient and recommended skilled nursing facility  HTN  -Controlled, on no medications at this time.   Afib  -Rate controlled   Seizure Disorder  -Continue keppra, luminal, depakote   Dementia  Continue namenda,    Code Status: full Family Communication: Discussed with patient and wife at bedside. Disposition Plan: Currently pending placement to skilled nursing facility. Discussed with Child psychotherapist   Consultants:  Neurology  Procedures:  As described above  Antibiotics:  None  HPI/Subjective: No new complaints. No acute issues overnight  Objective: Filed Vitals:   06/27/13 1359  BP: 111/75  Pulse: 102  Temp: 98.2 F (36.8 C)  Resp: 18    Intake/Output Summary (Last 24 hours) at 06/27/13 1705 Last data filed at 06/27/13 1300  Gross per 24 hour  Intake    720 ml  Output    250 ml  Net    470 ml   Filed Weights   06/24/13 2231  Weight: 81.92 kg (180 lb 9.6 oz)    Exam:   General:  Patient in no acute distress, nontoxic  Cardiovascular: Irregularly irregular, no murmurs  Respiratory: No increase work of breathing, clear to auscultation  Abdomen: Soft nontender  nondistended  Musculoskeletal: No cyanosis, and no pain reported with movement of extremities  Data Reviewed: Basic Metabolic Panel:  Recent Labs Lab 06/24/13 1520 06/25/13 0515  NA 141 144  K 4.1 3.6  CL 102 104  CO2 23 27  GLUCOSE 98 80  BUN 18 17  CREATININE 1.00 1.09  CALCIUM 10.6* 9.6   Liver Function Tests:  Recent Labs Lab 06/25/13 0515  AST 19  ALT 9  ALKPHOS 106  BILITOT 0.4  PROT 7.5  ALBUMIN 3.6   No results found for this basename: LIPASE, AMYLASE,  in the last 168 hours No results found for this basename: AMMONIA,  in the last 168 hours CBC:  Recent Labs Lab 06/24/13 1800 06/25/13 0515  WBC 12.2* 11.2*  NEUTROABS  --  7.5  HGB 15.1 15.2  HCT 44.6 44.8  MCV 93.1 93.5  PLT 176 180   Cardiac Enzymes:  Recent Labs Lab 06/24/13 1520 06/25/13 0515  TROPONINI <0.30 <0.30   BNP (last 3 results) No results found for this basename: PROBNP,  in the last 8760 hours CBG:  Recent Labs Lab 06/25/13 0656 06/25/13 2147 06/26/13 0634 06/26/13 1142 06/26/13 1634  GLUCAP 78 100* 87 160* 113*    No results found for this or any previous visit (from the past 240 hour(s)).   Studies: No results found.  Scheduled Meds: . atorvastatin  40 mg Oral q1800  . dipyridamole-aspirin  1 capsule Oral BID  . divalproex  250 mg Oral Daily  . enoxaparin (LOVENOX) injection  40 mg Subcutaneous Q24H  . escitalopram  20 mg Oral Daily  . galantamine  8 mg Oral BID WC  . levETIRAcetam  500 mg Oral Q12H  . memantine  10 mg Oral BID  . PHENobarbital  32.4 mg Oral BID  . 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 If 7PM-7AM, please contact night-coverage at [email or web address], password  [facility] 06/27/2013, 5:05 PM  LOS: 3 days

## 2013-06-28 DIAGNOSIS — I6350 Cerebral infarction due to unspecified occlusion or stenosis of unspecified cerebral artery: Secondary | ICD-10-CM

## 2013-06-28 NOTE — Progress Notes (Signed)
Updated wife at bedside- they have selected SNF bed at [facility] for today- MD completing d/c summary, etc. Will plan for transport via car later this pm.  Reece Levy, MSW, Theresia Majors [phone]

## 2013-06-28 NOTE — Discharge Summary (Signed)
Physician Discharge Summary  [name] ZOX:[id number] DOB: 1946-04-17 DOA: 06/24/2013  PCP: [name], MD  Admit date: 06/24/2013 Discharge date: 06/28/2013  Time spent: > 35 minutes  Recommendations for Outpatient Follow-up:  1. 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 Patient also has underlying dementia.   Hospital Course:   Recurrently Falls, r/o CVA  -CT scan of head: negative for any acute abnormality  -MRI brain shows no acute infarct  -Carotid doppler: no ICA stenosis  -A1c 5.9, B12 normal, Folate normal  -Echo: No source of emboli. LV function normal.  -Neurology consulted and on last recommendation on 06/25/2013 they recommended PT for gait and strengthening and echocardiogram.  - PT and OT consulted and evaluated while in-house. Recommended skilled nursing facility placement -Continue Aggrenox, and lipitor  - Echocardiogram showed EF of 60-65% and grade 1 diastolic dysfunction  - Recurrent falls based on negative workup while in the hospital is most likely what is suspected to be due to to deconditioning physical therapy has evaluated patient and recommended skilled nursing facility   HTN  -Controlled, on no medications at this time.   Afib  -Rate controlled   Seizure Disorder  -Continue keppra, luminal, depakote   Dementia  Continue namenda  Procedures:  As listed  above  Consultations:  Neurology  PT/OT  Discharge Exam: Filed Vitals:   06/28/13 1028  BP: 111/72  Pulse: 69  Temp: 97.6 F (36.4 C)  Resp: 20    General: Pt in NAD, Alert and Awake Cardiovascular: RRR, no MRG Respiratory: CTA BL, no wheezes.  Discharge Instructions  Discharge Orders   Future Appointments Provider Department Dept Phone   10/15/2013 11:30 AM [name], NP [facility] [phone]   Future Orders Complete By Expires   Call MD for:  difficulty breathing, headache or visual disturbances  As directed    Call MD for:  temperature >100.4  As directed    Diet - low sodium heart healthy  As directed    Discharge instructions  As directed    Comments:     Discharge to SNF with physical therapy.   Increase activity slowly  As directed        Medication List         dipyridamole-aspirin 200-25 MG per 12 hr capsule  Commonly known as:  AGGRENOX  Take 1 capsule by mouth 2 (two) times daily.     divalproex 250 MG 24 hr tablet  Commonly known as:  DEPAKOTE ER     escitalopram 20 MG tablet  Commonly known as:  LEXAPRO  Take 20 mg by mouth daily.     fesoterodine 8 MG Tb24 tablet  Commonly known as:  TOVIAZ  Take 8 mg by mouth every morning.     folic acid 1 MG tablet  Commonly known as:  FOLVITE  Take 1 mg by mouth every morning.     galantamine 16 MG  24 hr capsule  Commonly known as:  RAZADYNE ER  Take 16 mg by mouth daily with breakfast.     hydrochlorothiazide 25 MG tablet  Commonly known as:  HYDRODIURIL  Take 25 mg by mouth every morning.     levETIRAcetam 500 MG tablet  Commonly known as:  KEPPRA  Take 500 mg by mouth every 12 (twelve) hours.     memantine 10 MG tablet  Commonly known as:  NAMENDA  Take 10 mg by mouth 2 (two) times daily.     NIFEdipine 60 MG 24 hr tablet  Commonly known as:  PROCARDIA-XL/ADALAT CC  Take 60 mg by mouth every morning.     PHENobarbital 32.4 MG tablet  Commonly known as:   LUMINAL  Take 32.4 mg by mouth 2 (two) times daily.     QUEtiapine 50 MG tablet  Commonly known as:  SEROQUEL  Take 50 mg by mouth at bedtime.     simvastatin 80 MG tablet  Commonly known as:  ZOCOR  Take 80 mg by mouth at bedtime.       No Known Allergies    The results of significant diagnostics from this hospitalization (including imaging, microbiology, ancillary and laboratory) are listed below for reference.    Significant Diagnostic Studies: Dg Chest 2 View  06/24/2013   CLINICAL DATA:  History of hypertension, stroke, seizures and myocardial infarction, fell today  EXAM: CHEST  2 VIEW  COMPARISON:  05/27/2013  FINDINGS: Moderate cardiomegaly. Status post CABG. Uncoiled aorta. Vascular pattern normal. Lungs clear. No pleural effusions. Bony thorax is intact.  IMPRESSION: No acute findings.   Electronically Signed   By: Esperanza Heir M.D.   On: 06/24/2013 19:08   Dg Pelvis 1-2 Views  06/24/2013   CLINICAL DATA:  Patient fell today and landed on buttocks  EXAM: PELVIS - 1-2 VIEW  COMPARISON:  None.  FINDINGS: There is no evidence of pelvic fracture or diastasis. No other pelvic bone lesions are seen. There is a 1.3 cm focus of foreign body material projecting over the inner wall of the right acetabulum of uncertain origin.  IMPRESSION: No fracture. Foreign body of uncertain origin on the right. It is unlikely to be of acute significance.   Electronically Signed   By: Esperanza Heir M.D.   On: 06/24/2013 19:07   Ct Head Wo Contrast  06/24/2013   CLINICAL DATA:  Patient fell last week and has fallen 6 more times since then  EXAM: CT HEAD WITHOUT CONTRAST  TECHNIQUE: Contiguous axial images were obtained from the base of the skull through the vertex without intravenous contrast.  COMPARISON:  06/17/2013  FINDINGS: The calvarium is intact with no skull fracture. No significant paranasal sinus inflammatory change. There is moderate diffuse atrophy. No focal attenuation abnormality to  suggest vascular territory infarct. Small area of low attenuation left anterior pons, possibly representing lacunar infarct. No hemorrhage or extra-axial fluid. Low attenuation in the deep white matter consistent with chronic small vessel ischemic change.  IMPRESSION: No acute traumatic injury. Chronic involutional change. Possible tiny lacunar pontine infarct, age uncertain.   Electronically Signed   By: Esperanza Heir M.D.   On: 06/24/2013 19:03   Ct Head Wo Contrast  06/17/2013   CLINICAL DATA:  The patient fell in the driveway. Confusion over the last 2 days. Recent falls. Unable to stand.  EXAM: CT HEAD WITHOUT CONTRAST  TECHNIQUE: Contiguous axial images were obtained from the base of the skull through the vertex without  intravenous contrast.  COMPARISON:  05/18/2013  FINDINGS: There is moderate central and cortical atrophy. Periventricular white matter changes are consistent with small vessel disease. There is no evidence for hemorrhage, mass lesion, or acute infarction. There is atherosclerotic calcification of the internal carotid arteries. No calvarial fracture or paranasal sinus disease.  IMPRESSION: 1. Atrophy and small vessel disease. 2.  No evidence for acute intracranial abnormality.   Electronically Signed   By: Rosalie Gums M.D.   On: 06/17/2013 15:30   Mr Brain Wo Contrast  06/25/2013   CLINICAL DATA:  Ataxia. Confusion. History of stroke, seizures, hypertension and dementia.  EXAM: MRI HEAD WITHOUT CONTRAST  MRA HEAD WITHOUT CONTRAST  TECHNIQUE: Multiplanar, multiecho pulse sequences of the brain and surrounding structures were obtained without intravenous contrast. Angiographic images of the head were obtained using MRA technique without contrast.  COMPARISON:  06/24/2013 CT. 07/03/2011 and 08/01/2010 MR.  FINDINGS: MRI HEAD FINDINGS  Exam is motion degraded.  No acute infarct.  Remote posterior right frontal lobe infarct. Remote tiny cerebellar infarcts. Remote coronal radiata/ basal  ganglia infarcts/prominent small vessel disease type changes. Small vessel disease type changes of the pons.  Within the right uncus, 5 mm lesion has an appearance suggestive of a small cavernoma and is slightly more conspicuous than on prior examinations.  No other evidence of intracranial mass/ hemorrhage.  Global atrophy. The degree of ventricular prominence may be related to atrophy although difficult to exclude a mild component of hydrocephalus. Appearance is without significant change.  Mild exophthalmos.  MRA HEAD FINDINGS  Moderate narrowing petrous segment of the right internal carotid artery. Moderate to marked narrowing pre cavernous segment right internal carotid artery. High-grade stenosis at the junction of the cavernous and supraclinoid aspect of the right internal carotid artery.  Moderate to marked narrowing cavernous segment left internal carotid artery.  Aplastic A1 segment right anterior cerebral artery.  Ectatic M1 segment middle cerebral artery bilaterally.  Middle cerebral artery moderate branch vessel irregularity bilaterally with decrease number of visualized right middle cerebral artery branches consistent with patient's remote infarct.  Mild narrowing A2 segment left anterior cerebral artery.  Ectatic vertebral arteries bilaterally with moderate to marked narrowing distal left vertebral artery and moderate narrowing and irregularity distal right vertebral artery.  Moderate narrowing of the posterior inferior cerebellar artery bilaterally.  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  Moderate to marked narrowing mid aspect of the basilar artery.  Non visualization anterior inferior cerebellar arteries bilaterally.  Moderate narrowing distal basilar artery just proximal to the takeoff of the superior cerebral arteries.  Marked narrowing mid aspect left posterior cerebral artery. Moderate narrowing proximal and mid aspect right posterior cerebral artery. Narrowing of the posterior  cerebral artery distal branches greater on the right.  IMPRESSION: Exam is motion degraded.  No acute infarct.  Remote posterior right frontal lobe infarct. Remote tiny cerebellar infarcts. Remote coronal radiata/ basal ganglia infarcts/prominent small vessel disease type changes. Small vessel disease type changes of the pons.  Within the right uncus, 5 mm lesion has an appearance suggestive of a small cavernoma and is slightly more conspicuous than on prior examinations.  Global atrophy. The degree of ventricular prominence may be related to atrophy although difficult to exclude a mild component of hydrocephalus. Appearance is without significant change.  Progressive significant intracranial atherosclerotic type changes as detailed above.   Electronically Signed   By: Bridgett Larsson M.D.   On: 06/25/2013 08:49   Mr Maxine Glenn Head/brain Wo Cm  06/25/2013   CLINICAL DATA:  Ataxia. Confusion. History of stroke, seizures, hypertension and dementia.  EXAM: MRI HEAD WITHOUT CONTRAST  MRA HEAD WITHOUT CONTRAST  TECHNIQUE: Multiplanar, multiecho pulse sequences of the brain and surrounding structures were obtained without intravenous contrast. Angiographic images of the head were obtained using MRA technique without contrast.  COMPARISON:  06/24/2013 CT. 07/03/2011 and 08/01/2010 MR.  FINDINGS: MRI HEAD FINDINGS  Exam is motion degraded.  No acute infarct.  Remote posterior right frontal lobe infarct. Remote tiny cerebellar infarcts. Remote coronal radiata/ basal ganglia infarcts/prominent small vessel disease type changes. Small vessel disease type changes of the pons.  Within the right uncus, 5 mm lesion has an appearance suggestive of a small cavernoma and is slightly more conspicuous than on prior examinations.  No other evidence of intracranial mass/ hemorrhage.  Global atrophy. The degree of ventricular prominence may be related to atrophy although difficult to exclude a mild component of hydrocephalus. Appearance is  without significant change.  Mild exophthalmos.  MRA HEAD FINDINGS  Moderate narrowing petrous segment of the right internal carotid artery. Moderate to marked narrowing pre cavernous segment right internal carotid artery. High-grade stenosis at the junction of the cavernous and supraclinoid aspect of the right internal carotid artery.  Moderate to marked narrowing cavernous segment left internal carotid artery.  Aplastic A1 segment right anterior cerebral artery.  Ectatic M1 segment middle cerebral artery bilaterally.  Middle cerebral artery moderate branch vessel irregularity bilaterally with decrease number of visualized right middle cerebral artery branches consistent with patient's remote infarct.  Mild narrowing A2 segment left anterior cerebral artery.  Ectatic vertebral arteries bilaterally with moderate to marked narrowing distal left vertebral artery and moderate narrowing and irregularity distal right vertebral artery.  Moderate narrowing of the posterior inferior cerebellar artery bilaterally.  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  Moderate to marked narrowing mid aspect of the basilar artery.  Non visualization anterior inferior cerebellar arteries bilaterally.  Moderate narrowing distal basilar artery just proximal to the takeoff of the superior cerebral arteries.  Marked narrowing mid aspect left posterior cerebral artery. Moderate narrowing proximal and mid aspect right posterior cerebral artery. Narrowing of the posterior cerebral artery distal branches greater on the right.  IMPRESSION: Exam is motion degraded.  No acute infarct.  Remote posterior right frontal lobe infarct. Remote tiny cerebellar infarcts. Remote coronal radiata/ basal ganglia infarcts/prominent small vessel disease type changes. Small vessel disease type changes of the pons.  Within the right uncus, 5 mm lesion has an appearance suggestive of a small cavernoma and is slightly more conspicuous than on prior  examinations.  Global atrophy. The degree of ventricular prominence may be related to atrophy although difficult to exclude a mild component of hydrocephalus. Appearance is without significant change.  Progressive significant intracranial atherosclerotic type changes as detailed above.   Electronically Signed   By: Bridgett Larsson M.D.   On: 06/25/2013 08:49    Microbiology: No results found for this or any previous visit (from the past 240 hour(s)).   Labs: Basic Metabolic Panel:  Recent Labs Lab 06/24/13 1520 06/25/13 0515  NA 141 144  K 4.1 3.6  CL 102 104  CO2 23 27  GLUCOSE 98 80  BUN 18 17  CREATININE 1.00 1.09  CALCIUM 10.6* 9.6   Liver Function Tests:  Recent Labs Lab 06/25/13 0515  AST 19  ALT 9  ALKPHOS 106  BILITOT 0.4  PROT 7.5  ALBUMIN 3.6   No results found for  this basename: LIPASE, AMYLASE,  in the last 168 hours No results found for this basename: AMMONIA,  in the last 168 hours CBC:  Recent Labs Lab 06/24/13 1800 06/25/13 0515  WBC 12.2* 11.2*  NEUTROABS  --  7.5  HGB 15.1 15.2  HCT 44.6 44.8  MCV 93.1 93.5  PLT 176 180   Cardiac Enzymes:  Recent Labs Lab 06/24/13 1520 06/25/13 0515  TROPONINI <0.30 <0.30   BNP: BNP (last 3 results) No results found for this basename: PROBNP,  in the last 8760 hours CBG:  Recent Labs Lab 06/25/13 0656 06/25/13 2147 06/26/13 0634 06/26/13 1142 06/26/13 1634  GLUCAP 78 100* 87 160* 113*       Signed:  Penny Pia  [facility] 06/28/2013, 2:37 PM

## 2013-06-29 ENCOUNTER — Encounter: Payer: Self-pay | Admitting: Internal Medicine

## 2013-06-29 ENCOUNTER — Non-Acute Institutional Stay (SKILLED_NURSING_FACILITY): Payer: Medicare Other | Admitting: Internal Medicine

## 2013-06-29 ENCOUNTER — Telehealth: Payer: Self-pay | Admitting: Nurse Practitioner

## 2013-06-29 DIAGNOSIS — G40309 Generalized idiopathic epilepsy and epileptic syndromes, not intractable, without status epilepticus: Secondary | ICD-10-CM

## 2013-06-29 DIAGNOSIS — I6350 Cerebral infarction due to unspecified occlusion or stenosis of unspecified cerebral artery: Secondary | ICD-10-CM

## 2013-06-29 DIAGNOSIS — I639 Cerebral infarction, unspecified: Secondary | ICD-10-CM

## 2013-06-29 DIAGNOSIS — N318 Other neuromuscular dysfunction of bladder: Secondary | ICD-10-CM

## 2013-06-29 DIAGNOSIS — I4891 Unspecified atrial fibrillation: Secondary | ICD-10-CM

## 2013-06-29 DIAGNOSIS — E785 Hyperlipidemia, unspecified: Secondary | ICD-10-CM

## 2013-06-29 DIAGNOSIS — F329 Major depressive disorder, single episode, unspecified: Secondary | ICD-10-CM

## 2013-06-29 DIAGNOSIS — F0150 Vascular dementia without behavioral disturbance: Secondary | ICD-10-CM

## 2013-06-29 DIAGNOSIS — N3281 Overactive bladder: Secondary | ICD-10-CM | POA: Insufficient documentation

## 2013-06-29 DIAGNOSIS — F0280 Dementia in other diseases classified elsewhere without behavioral disturbance: Secondary | ICD-10-CM | POA: Insufficient documentation

## 2013-06-29 DIAGNOSIS — R5381 Other malaise: Secondary | ICD-10-CM | POA: Insufficient documentation

## 2013-06-29 DIAGNOSIS — I10 Essential (primary) hypertension: Secondary | ICD-10-CM

## 2013-06-29 NOTE — Telephone Encounter (Signed)
called patient to reschedule appt per yan, wife stated husband is in rehab don't know when he is surpose to get out. will call back.
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
## 2013-07-18 ENCOUNTER — Inpatient Hospital Stay (HOSPITAL_COMMUNITY)
Admission: EM | Admit: 2013-07-18 | Discharge: 2013-07-23 | DRG: 062 | Disposition: A | Discharge status: Skilled Nursing Facility | Payer: Medicare Other | Attending: Neurology | Admitting: Neurology

## 2013-07-18 ENCOUNTER — Inpatient Hospital Stay (HOSPITAL_COMMUNITY): Payer: Medicare Other

## 2013-07-18 ENCOUNTER — Non-Acute Institutional Stay (SKILLED_NURSING_FACILITY): Payer: Medicare Other | Admitting: Internal Medicine

## 2013-07-18 ENCOUNTER — Encounter: Payer: Self-pay | Admitting: Internal Medicine

## 2013-07-18 ENCOUNTER — Encounter (HOSPITAL_COMMUNITY): Payer: Self-pay | Admitting: Emergency Medicine

## 2013-07-18 ENCOUNTER — Emergency Department (HOSPITAL_COMMUNITY): Payer: Medicare Other

## 2013-07-18 DIAGNOSIS — G608 Other hereditary and idiopathic neuropathies: Secondary | ICD-10-CM | POA: Diagnosis present

## 2013-07-18 DIAGNOSIS — R279 Unspecified lack of coordination: Secondary | ICD-10-CM | POA: Diagnosis present

## 2013-07-18 DIAGNOSIS — I4891 Unspecified atrial fibrillation: Secondary | ICD-10-CM | POA: Diagnosis present

## 2013-07-18 DIAGNOSIS — G309 Alzheimer's disease, unspecified: Secondary | ICD-10-CM | POA: Diagnosis present

## 2013-07-18 DIAGNOSIS — G8190 Hemiplegia, unspecified affecting unspecified side: Secondary | ICD-10-CM | POA: Diagnosis present

## 2013-07-18 DIAGNOSIS — I6340 Cerebral infarction due to embolism of unspecified cerebral artery: Principal | ICD-10-CM | POA: Diagnosis present

## 2013-07-18 DIAGNOSIS — I10 Essential (primary) hypertension: Secondary | ICD-10-CM | POA: Diagnosis present

## 2013-07-18 DIAGNOSIS — G4733 Obstructive sleep apnea (adult) (pediatric): Secondary | ICD-10-CM | POA: Diagnosis present

## 2013-07-18 DIAGNOSIS — G40309 Generalized idiopathic epilepsy and epileptic syndromes, not intractable, without status epilepticus: Secondary | ICD-10-CM | POA: Diagnosis present

## 2013-07-18 DIAGNOSIS — Z87891 Personal history of nicotine dependence: Secondary | ICD-10-CM

## 2013-07-18 DIAGNOSIS — Z951 Presence of aortocoronary bypass graft: Secondary | ICD-10-CM

## 2013-07-18 DIAGNOSIS — IMO0002 Reserved for concepts with insufficient information to code with codable children: Secondary | ICD-10-CM

## 2013-07-18 DIAGNOSIS — I6350 Cerebral infarction due to unspecified occlusion or stenosis of unspecified cerebral artery: Secondary | ICD-10-CM

## 2013-07-18 DIAGNOSIS — E785 Hyperlipidemia, unspecified: Secondary | ICD-10-CM | POA: Diagnosis present

## 2013-07-18 DIAGNOSIS — I2510 Atherosclerotic heart disease of native coronary artery without angina pectoris: Secondary | ICD-10-CM | POA: Diagnosis present

## 2013-07-18 DIAGNOSIS — M6281 Muscle weakness (generalized): Secondary | ICD-10-CM

## 2013-07-18 DIAGNOSIS — I379 Nonrheumatic pulmonary valve disorder, unspecified: Secondary | ICD-10-CM

## 2013-07-18 DIAGNOSIS — F0391 Unspecified dementia with behavioral disturbance: Secondary | ICD-10-CM | POA: Diagnosis present

## 2013-07-18 DIAGNOSIS — I639 Cerebral infarction, unspecified: Secondary | ICD-10-CM

## 2013-07-18 DIAGNOSIS — I252 Old myocardial infarction: Secondary | ICD-10-CM

## 2013-07-18 DIAGNOSIS — R569 Unspecified convulsions: Secondary | ICD-10-CM | POA: Diagnosis present

## 2013-07-18 DIAGNOSIS — R4789 Other speech disturbances: Secondary | ICD-10-CM | POA: Diagnosis present

## 2013-07-18 DIAGNOSIS — F0280 Dementia in other diseases classified elsewhere without behavioral disturbance: Secondary | ICD-10-CM | POA: Diagnosis present

## 2013-07-18 DIAGNOSIS — F0281 Dementia in other diseases classified elsewhere with behavioral disturbance: Secondary | ICD-10-CM | POA: Diagnosis present

## 2013-07-18 DIAGNOSIS — F03918 Unspecified dementia, unspecified severity, with other behavioral disturbance: Secondary | ICD-10-CM | POA: Diagnosis present

## 2013-07-18 LAB — COMPREHENSIVE METABOLIC PANEL
ALT: 13 U/L (ref 0–53)
AST: 13 U/L (ref 0–37)
Alkaline Phosphatase: 101 U/L (ref 39–117)
CO2: 26 mEq/L (ref 19–32)
Calcium: 9.3 mg/dL (ref 8.4–10.5)
Creatinine, Ser: 0.88 mg/dL (ref 0.50–1.35)
GFR calc Af Amer: >90 mL/min (ref >90)
GFR calc non Af Amer: 88 mL/min — ABNORMAL LOW (ref >90)
Glucose, Bld: 87 mg/dL (ref 70–99)

## 2013-07-18 LAB — DIFFERENTIAL
Basophils Absolute: 0 10*3/uL (ref 0.0–0.1)
Eosinophils Absolute: 0.1 10*3/uL (ref 0.0–0.7)
Eosinophils Relative: 1 % (ref 0–5)
Lymphocytes Relative: 23 % (ref 12–46)
Lymphs Abs: 2.3 10*3/uL (ref 0.7–4.0)
Monocytes Absolute: 0.9 10*3/uL (ref 0.1–1.0)

## 2013-07-18 LAB — CBC
HCT: 38 % — ABNORMAL LOW (ref 39.0–52.0)
Hemoglobin: 12.8 g/dL — ABNORMAL LOW (ref 13.0–17.0)
MCH: 31.5 pg (ref 26.0–34.0)
MCV: 93.6 fL (ref 78.0–100.0)
RDW: 14.8 % (ref 11.5–15.5)
WBC: 9.9 10*3/uL (ref 4.0–10.5)

## 2013-07-18 LAB — GLUCOSE, CAPILLARY: Glucose-Capillary: 79 mg/dL (ref 70–99)

## 2013-07-18 MED ORDER — ACETAMINOPHEN 650 MG RE SUPP: 650.0000 mg | RECTAL | Status: DC | PRN

## 2013-07-18 MED ORDER — LABETALOL HCL 5 MG/ML IV SOLN: 10.0000 mg | INTRAVENOUS | Status: DC | PRN

## 2013-07-18 MED ORDER — ACETAMINOPHEN 325 MG PO TABS: 650.0000 mg | ORAL_TABLET | ORAL | Status: DC | PRN

## 2013-07-18 MED ORDER — IOHEXOL 350 MG/ML SOLN
50.0000 mL | Freq: Once | INTRAVENOUS | Status: AC | PRN
  Administered 2013-07-18: 50 mL via INTRAVENOUS

## 2013-07-18 MED ORDER — SODIUM CHLORIDE 0.9 % IV SOLN
INTRAVENOUS | Status: DC
  Administered 2013-07-18 – 2013-07-19 (×3): via INTRAVENOUS
  Administered 2013-07-19: 75 mL/h via INTRAVENOUS
  Administered 2013-07-20: 11:00:00 via INTRAVENOUS
  Administered 2013-07-21: 1000 mL via INTRAVENOUS
  Administered 2013-07-22 – 2013-07-23 (×3): via INTRAVENOUS

## 2013-07-18 MED ORDER — ALTEPLASE (STROKE) FULL DOSE INFUSION
78.0000 mg | Freq: Once | INTRAVENOUS | Status: AC
  Administered 2013-07-18: 78 mg via INTRAVENOUS
  Filled 2013-07-18: qty 78

## 2013-07-18 MED ORDER — PANTOPRAZOLE SODIUM 40 MG IV SOLR
40.0000 mg | Freq: Every day | INTRAVENOUS | Status: DC
  Administered 2013-07-18 – 2013-07-20 (×3): 40 mg via INTRAVENOUS
  Filled 2013-07-18 (×4): qty 40

## 2013-07-18 MED ORDER — SENNOSIDES-DOCUSATE SODIUM 8.6-50 MG PO TABS
1.0000 | ORAL_TABLET | Freq: Every evening | ORAL | Status: DC | PRN
  Filled 2013-07-18: qty 1

## 2013-07-18 NOTE — ED Notes (Signed)
Activated a CODE STROKE

## 2013-07-18 NOTE — ED Notes (Signed)
Spoke with RN at [facility], normally pt is able to move left side more often, went to therapy at 0930 and he was acting normal but then while he was participating they noticed he became lethargic and more weak so they called EMS.
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
## 2013-07-18 NOTE — Progress Notes (Signed)
  Echocardiogram 2D Echocardiogram has been performed.  [name] [name] 07/18/2013, 5:37 PM

## 2013-07-18 NOTE — ED Notes (Signed)
Spoke with flow manager, the bed on 3100 should be ready shortly.

## 2013-07-18 NOTE — ED Notes (Signed)
Made aware pt is ready for CT.

## 2013-07-18 NOTE — ED Notes (Signed)
Spoke with Dr. Leroy Kennedy, hold off on MRI until the morning.

## 2013-07-18 NOTE — ED Notes (Signed)
Called CT to update them about when pt is available.

## 2013-07-18 NOTE — ED Notes (Signed)
Pt returned from CT °

## 2013-07-18 NOTE — ED Notes (Signed)
Attempted to call report. RN is off the floor at this moment.

## 2013-07-18 NOTE — ED Notes (Signed)
Portable xray at bedside.

## 2013-07-18 NOTE — ED Notes (Signed)
Per EMS - pt coming from [facility]. Staff reports at 950 am they noticed he was weaker on the left arm and slurred speech, pt has hx of stroke that left him with weakness to the left side, staff administered 325 mg of ASA. EMS reports rales in lung sounds. No slurred speech, no facial droop, a&oX4. 20 G in left hand. BP 139/90 HR 86 irreg, hx of a/fib. Denies pain. CBG 118. Pt c/o cough.

## 2013-07-18 NOTE — Code Documentation (Signed)
67yo male arriving to [facility] via [facility].  Report that patient was at his baseline this morning at 0930 at the nursing home and went to therapy where they found him to have increased left sided weakness and slurred speech.  Patient has a history of stroke with residual left sided weakness, but is able to walk with a walker per the facility. CT completed.  NIHSS 17 on arrival, see documentation for details.  Wife at bedside and confirms that the patient's symptoms are not his baseline.  Dr. Leroy Kennedy at bedside and ordered to treat with tPA.  tPA started at 1133.  Patient to be admitted to ICU for further monitoring.  Bedside handoff with ED RN Dorene Grebe.

## 2013-07-18 NOTE — ED Notes (Signed)
Alteplase bolus finished.

## 2013-07-18 NOTE — Progress Notes (Signed)
Patient ID: [name], male   DOB: 05-09-1946, 67 y.o.   MRN: [id number]  Location:  [facility] Provider:  Elmarie Shiley L. Renato Gails, D.O., C.M.D.  Code Status: full code   Chief Complaint  Patient presents with  . Acute Visit    sudden decreased responsiveness, then dysarthria, significantly worsened left sided weakness, facial droop    HPI: 67 yo male with h/o prior strokes with mild left hemiparesis, vascular dementia, afib, seizures and hypertension here for short term rehab. I had seen him in his normal state of health 20-30 mins prior to this event.  Pt was participating in therapy when he developed sudden decreased level of consciousness (had only 4+/5 LUE weakness prior to this, was able to ambulate 200 ft, roll himself with his wheelchair and elevate his arm 90 degrees on the left).  He was then arousable.  No tongue bite or incontinence.  Was then noted to have left sided weakness much more pronounced, facial droop and dysarthria.  I was called to evaluate him and noted these same findings.    Review of Systems:  Review of Systems  Respiratory: Negative for shortness of breath.   Cardiovascular: Negative for chest pain.  Gastrointestinal: Negative for abdominal pain.  Musculoskeletal: Negative for back pain and joint pain.  Neurological: Positive for sensory change, speech change, focal weakness and weakness. Negative for dizziness.       H/o seizures  Psychiatric/Behavioral: Positive for memory loss.    Medications: Patient's Medications  New Prescriptions   No medications on file  Previous Medications   DIPYRIDAMOLE-ASPIRIN (AGGRENOX) 200-25 MG PER 12 HR CAPSULE    Take 1 capsule by mouth 2 (two) times daily.   DIVALPROEX (DEPAKOTE ER) 250 MG 24 HR TABLET       ESCITALOPRAM (LEXAPRO) 20 MG TABLET    Take 20 mg by mouth daily.   FESOTERODINE FUMARATE 8 MG TB24    Take 8 mg by mouth every morning.    FOLIC ACID (FOLVITE) 1 MG TABLET    Take 1 mg by mouth every  morning.    GALANTAMINE (RAZADYNE ER) 16 MG 24 HR CAPSULE    Take 16 mg by mouth daily with breakfast.    HYDROCHLOROTHIAZIDE (HYDRODIURIL) 25 MG TABLET    Take 25 mg by mouth every morning.    LEVETIRACETAM (KEPPRA) 500 MG TABLET    Take 500 mg by mouth every 12 (twelve) hours.    MEMANTINE (NAMENDA) 10 MG TABLET    Take 10 mg by mouth 2 (two) times daily.     NIFEDIPINE (PROCARDIA-XL/ADALAT CC) 60 MG 24 HR TABLET    Take 60 mg by mouth every morning.    PHENOBARBITAL (LUMINAL) 32.4 MG TABLET    Take 32.4 mg by mouth 2 (two) times daily.     QUETIAPINE (SEROQUEL) 50 MG TABLET    Take 50 mg by mouth at bedtime.   SIMVASTATIN (ZOCOR) 80 MG TABLET    Take 80 mg by mouth at bedtime.  Modified Medications   No medications on file  Discontinued Medications   No medications on file    Physical Exam: Filed Vitals:   07/18/13 1020  BP: 154/110  Pulse: 112  Resp: 16  SpO2: 98%  Physical Exam  Constitutional: He appears well-developed and well-nourished.  Cardiovascular: Intact distal pulses.   No murmur heard. irreg irreg  Pulmonary/Chest: Effort normal and breath sounds normal. No respiratory distress.  Abdominal: Soft. Bowel sounds are normal. He exhibits  no distension. There is no tenderness.  Neurological: He displays abnormal reflex. A cranial nerve deficit is present. He exhibits abnormal muscle tone. Coordination abnormal.  Drowsy, pleasantly confused;  LUE now 3/5, LLE 3/5, RUE and RLE 5/5, has facial droop, dysarthria, oriented to person and place, not time  Skin: Skin is warm and dry.    Labs reviewed: Basic Metabolic Panel:  Recent Labs  96/04/54 1416 06/24/13 1520 06/25/13 0515  NA 141 141 144  K 4.6 4.1 3.6  CL 106 102 104  CO2 27 23 27   GLUCOSE 87 98 80  BUN 13 18 17   CREATININE 1.03 1.00 1.09  CALCIUM 9.6 10.6* 9.6    Liver Function Tests:  Recent Labs  01/11/13 2338 05/27/13 2246 06/25/13 0515  AST 17 14 19   ALT 9 9 9   ALKPHOS 125* 129* 106    BILITOT 0.2* 0.2* 0.4  PROT 8.1 7.6 7.5  ALBUMIN 4.2 3.8 3.6    CBC:  Recent Labs  05/18/13 0238  06/17/13 1416 06/24/13 1800 06/25/13 0515  WBC 9.9  < > 7.7 12.2* 11.2*  NEUTROABS 6.5  --  5.1  --  7.5  HGB 14.2  < > 13.4 15.1 15.2  HCT 41.6  < > 39.7 44.6 44.8  MCV 91.2  < > 92.8 93.1 93.5  PLT 198  < > 180 176 180  < > = values in this interval not displayed.  Significant Diagnostic Results:  Prior imaging studies reviewed indicating old strokes  Assessment/Plan 1. Stroke, acute -new onset worsened left sided weakness, new dysarthria and new facial droop this am during physical therapy -previously ambulating 200 ft in therapy and able to push self with wheelchair and had no cranial nerve deficits -was seen normal immediately before -was given chewable aspirin, EMS called, placed on O2 and transported to ED for stroke evaluation  Family/ staff Communication: his wife was called and will meet pt at hospital Goals of care: full code  Labs/tests ordered:  Stat ED eval for stroke

## 2013-07-18 NOTE — ED Notes (Signed)
Dr. Camillio at bedside  

## 2013-07-18 NOTE — ED Notes (Signed)
Wife at bedside, reports normally pt can speak more clearly and can use his left arm more than now.

## 2013-07-18 NOTE — ED Notes (Signed)
Pt now on CT table.

## 2013-07-18 NOTE — ED Notes (Signed)
Drew bld, sent down to main lab.

## 2013-07-18 NOTE — ED Notes (Signed)
Spoke with Dr. Cyril Mourning, wants to wait for pt to have CTA until TPA is finished infusing.

## 2013-07-18 NOTE — ED Notes (Signed)
Transported pt to CT.

## 2013-07-18 NOTE — ED Notes (Signed)
Patient transported to CT 

## 2013-07-18 NOTE — ED Notes (Signed)
NOTIFIED DR. LOCKWOOD IN PERSON OF PATIENTS LAB RESULTS OF I STAT CHEM 8+ K = 2.0 mmoI/L , iCa= 0.72 mmoI/L , GLU = 42mg /dl , BUN = 3mg /dl , Hb = 6.5 G/DL @ 82:95 PM ,62/13/0865.
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
## 2013-07-18 NOTE — ED Notes (Addendum)
Transported pt back to room. Stroke team at bedside.

## 2013-07-18 NOTE — ED Notes (Signed)
Pt transported to MRI 

## 2013-07-18 NOTE — ED Notes (Signed)
Brought pt back from MRI, about to enter room when another Code stroke needed to use the MRI more emergently. MRI will call when they are ready.

## 2013-07-19 ENCOUNTER — Inpatient Hospital Stay (HOSPITAL_COMMUNITY): Payer: Medicare Other

## 2013-07-19 DIAGNOSIS — I6350 Cerebral infarction due to unspecified occlusion or stenosis of unspecified cerebral artery: Secondary | ICD-10-CM

## 2013-07-19 LAB — LIPID PANEL
Cholesterol: 137 mg/dL (ref 0–200)
HDL: 54 mg/dL (ref >39)
LDL Cholesterol: 67 mg/dL (ref 0–99)
Total CHOL/HDL Ratio: 2.5 RATIO

## 2013-07-19 LAB — HEMOGLOBIN A1C: Hgb A1c MFr Bld: 5.9 % — ABNORMAL HIGH (ref <5.7)

## 2013-07-19 MED ORDER — ASPIRIN 325 MG PO TABS
325.0000 mg | ORAL_TABLET | Freq: Every day | ORAL | Status: DC
  Administered 2013-07-19 – 2013-07-23 (×5): 325 mg via ORAL
  Filled 2013-07-19 (×6): qty 1

## 2013-07-19 MED ORDER — ASPIRIN 325 MG PO TABS: 325.0000 mg | ORAL_TABLET | Freq: Every day | ORAL | Status: DC

## 2013-07-19 MED ORDER — RIVAROXABAN 20 MG PO TABS
20.0000 mg | ORAL_TABLET | Freq: Every day | ORAL | Status: DC
  Filled 2013-07-19: qty 1

## 2013-07-19 MED ORDER — ASPIRIN EC 81 MG PO TBEC: 81.0000 mg | DELAYED_RELEASE_TABLET | Freq: Every day | ORAL | Status: DC

## 2013-07-19 NOTE — Progress Notes (Signed)
Spoke with Dr. Pearlean Brownie. Per MD, due to MRI results indicating hemorrhagic transformation, ok to start Aspirin 325 daily and will start Xarelto in one week. Previous Xarelto order discontinued and new Aspirin order written. Note: pt did not get a dose of Xarelto this admission.   [name]

## 2013-07-19 NOTE — Progress Notes (Signed)
OT Cancellation Note  Patient Details Name: Jaquese Irving MRN: [id number] DOB: 01/18/1946   Cancelled Treatment:    Reason Eval/Treat Not Completed: Patient not medically ready (attempted to see this am. bedrest orders)  [facility] Jewelz Kobus, OTR/L  [phone] 07/19/2013 07/19/2013, 4:33 PM
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
## 2013-07-19 NOTE — Progress Notes (Signed)
Stroke Team Progress Note  HISTORY 67 yo man, history of HTN, multiple prior CVAs, HTN, seizures, OSA on CPAP, CAD s/p CABG, prior tobacco use (quit 9 yrs ago), presented 12/3 with worsening left sided weakness and slurred speech.  Symptom onset 9:30 AM, in ED 10:28 AM, given TPA 11:33 AM, due to NIHSS = 17 (unclear how much is old vs new).  CT head normal.  He was admitted to the neuro ICU for further evaluation and treatment.  SUBJECTIVE His wife is at bedside.  She notes that his weakness is worse than it was 2 days ago, though somewhat improved since yesterday.  Afib noted today.  OBJECTIVE Most recent Vital Signs: Filed Vitals:   07/19/13 0400 07/19/13 0500 07/19/13 0600 07/19/13 0700  BP: 143/92 136/88 133/94 128/77  Pulse: 84 77 140 76  Temp:    97.7 F (36.5 C)  TempSrc:    Oral  Resp: 24 23 32 13  Height:      Weight:      SpO2: 96% 97% 100% 99%   CBG (last 3)   Recent Labs  07/18/13 1134  GLUCAP 79    IV Fluid Intake:   . sodium chloride 75 mL/hr at 07/19/13 0700    MEDICATIONS  . pantoprazole (PROTONIX) IV  40 mg Intravenous QHS   PRN:  acetaminophen, acetaminophen, labetalol, senna-docusate  Diet:  NPO Activity:  Bedrest DVT Prophylaxis:  SCD's  CLINICALLY SIGNIFICANT STUDIES Basic Metabolic Panel:  Recent Labs Lab 07/18/13 1112  NA 137  K 3.8  CL 101  CO2 26  GLUCOSE 87  BUN 10  CREATININE 0.88  CALCIUM 9.3   Liver Function Tests:  Recent Labs Lab 07/18/13 1112  AST 13  ALT 13  ALKPHOS 101  BILITOT 0.3  PROT 7.2  ALBUMIN 3.4*   CBC:  Recent Labs Lab 07/18/13 1112  WBC 9.9  NEUTROABS 6.7  HGB 12.8*  HCT 38.0*  MCV 93.6  PLT 179   Coagulation:  Recent Labs Lab 07/18/13 1112  LABPROT 13.5  INR 1.05   Cardiac Enzymes:  Recent Labs Lab 07/18/13 1112  TROPONINI <0.30   Lipid Panel    Component Value Date/Time   CHOL 137 07/19/2013 0353   TRIG 79 07/19/2013 0353   HDL 54 07/19/2013 0353   CHOLHDL 2.5 07/19/2013 0353    VLDL 16 07/19/2013 0353   LDLCALC 67 07/19/2013 0353   HgbA1C  Lab Results  Component Value Date   HGBA1C 5.9* 06/25/2013    Urine Drug Screen:     Component Value Date/Time   LABOPIA NONE DETECTED 06/25/2013 1400   COCAINSCRNUR NONE DETECTED 06/25/2013 1400   LABBENZ NONE DETECTED 06/25/2013 1400   AMPHETMU NONE DETECTED 06/25/2013 1400   THCU NONE DETECTED 06/25/2013 1400   LABBARB POSITIVE* 06/25/2013 1400     Ct Angio Head W/cm &/or Wo Cm  07/18/2013   CLINICAL DATA:  Acute onset of left-sided weakness and slurred speech. Multiple prior infarcts. Code stroke.  EXAM: CT ANGIOGRAPHY HEAD  TECHNIQUE: Multidetector CT imaging of the head was performed using the standard protocol during bolus administration of intravenous contrast. Multiplanar CT image reconstructions including MIPs were obtained to evaluate the vascular anatomy.  CONTRAST:  50 mL Omnipaque 350  COMPARISON:  CT head without contrast 07/18/2013. MRI and MRA brain 06/25/2013.  FINDINGS: Remote lacunar infarcts are again noted within the basal ganglia bilaterally. The source images demonstrate no acute cortical infarct. The insular ribbon is intact. Basal ganglia are  stable. Moderate atrophy and diffuse white matter disease is again seen. No pathologic enhancement is evident.  Dense atherosclerotic calcifications are present within the cavernous carotid arteries bilaterally. Mild to moderate stenosis is present on the right. The supra clinoid segments are intact bilaterally. The right A1 segment is markedly hypoplastic. There is mild narrowing of proximal left A1 segment. The anterior communicating artery is patent. The A2 segments are within normal limits bilaterally. The MCA bifurcations are normal.  Irregular atherosclerotic disease is present in the distal vertebral arteries bilaterally with moderate to severe stenosis on the left. There is mild focal ectasia just distal to the vertebrobasilar junction. Mid basilar artery is  tortuous. Both posterior cerebral arteries originate from the basilar tip. There is a high-grade stenosis in the proximal right PCA.  Review of the MIP images confirms the above findings.  IMPRESSION: 1. Similar appearance of diffuse atherosclerotic disease involving the cavernous carotid arteries and distal vertebral arteries bilaterally. The cavernous disease is worse on the right. The vertebral disease is worse on the left. 2. Mild focal ectasia of the proximal basilar artery just distal to the vertebrobasilar junction. 3. Moderate stenosis of the proximal posterior cerebral arteries bilaterally with attenuation of distal branch vessels, right greater than left.   Electronically Signed   By: Gennette Pac M.D.   On: 07/18/2013 14:01   Ct Head (brain) Wo Contrast  07/18/2013   CLINICAL DATA:  Code stroke. Difficulty speaking. Weakness. Altered mental status.  EXAM: CT HEAD WITHOUT CONTRAST  TECHNIQUE: Contiguous axial images were obtained from the base of the skull through the vertex without intravenous contrast.  COMPARISON:  06/24/2013  FINDINGS: No mass lesion. No midline shift. No acute hemorrhage or hematoma. No extra-axial fluid collections. No evidence of acute infarction. There is diffuse atrophy with secondary ventricular dilatation as well as extensive periventricular white matter disease consistent with chronic small vessel ischemic changes, stable since the prior exam. No osseous abnormality.  IMPRESSION: No acute intracranial abnormality.  No change since the prior exam.   Electronically Signed   By: Geanie Cooley M.D.   On: 07/18/2013 12:09   Dg Chest Portable 1 View  07/18/2013   CLINICAL DATA:  67 year old male with weakness and hypertension.  EXAM: PORTABLE CHEST - 1 VIEW  COMPARISON:  06/24/2013 and prior chest radiographs dating back to 08/19/2005  FINDINGS: Cardiomegaly and CABG changes noted.  Mild peribronchial thickening is stable.  There is no evidence of focal airspace disease,  pulmonary edema, suspicious pulmonary nodule/mass, pleural effusion, or pneumothorax. No acute bony abnormalities are identified.  IMPRESSION: Cardiomegaly without evidence of acute cardiopulmonary disease.   Electronically Signed   By: Laveda Abbe M.D.   On: 07/18/2013 14:01    CT of the brain   No acute infarct  MRI of the brain   Pending  CTA of the brain   1. Similar appearance of diffuse atherosclerotic disease involving the cavernous carotid arteries and distal vertebral arteries bilaterally. The cavernous disease is worse on the right. The vertebral disease is worse on the left.  2. Mild focal ectasia of the proximal basilar artery just distal to the vertebrobasilar junction.  3. Moderate stenosis of the proximal posterior cerebral arteries bilaterally with attenuation of distal branch vessels, right greater than left.  2D Echocardiogram   EF 50-55%, RA dilation  Carotid Doppler   Not yet performed  CXR   No acute abnormality  EKG  NSR, RBBB, no change from prior  Therapy Recommendations Pending  Physical Exam   Mental Status:  A&O x3, speech slowed and somewhat slurred, no aphasia.  Pt follows commands with minimal difficulty Cranial Nerves:  II - complete left homonymous hemianopsia III/IV/VI - PERRL, EOMI right gaze preference but able to look to left past midline. V/VII - L lower facial droop VIII - hearing intact X - palate elevates bilaterally Motor: strength 4/5 in left arm and leg with weakness of grip and intrinsic hand muscles. Mild drift LUE and LLE Sensory: decreased sensation and extinction on left Deep Tendon Reflexes: 1+ bilaterally Cerebellar: Normal finger-to-nose testing, though impaired on L due to strength NIHSS 9 Carotid auscultation: No bruit   ASSESSMENT [name] is a 67 y.o. male presenting with left sided weakness and slurred speech suspect Right MCA branch infarct of cardioeembolic etiology from atrial fibrillation. Status post IV  t-PA at 11:33 (door to needle time 65 mins).  CT shows no acute infarct, MRI pending.  However, patient found to have afib (new diagnosis), which is likely the cause of the patient's stroke.  On aggrenow prior to admission. If follow-up MRI negative, will restart aggrenox for secondary stroke prevention. Patient with resultant improvement in symptoms. Work up underway.     ECHO shows EF 50-55%  LDL = 67  Afib seen on tele  Hospital day # 1  TREATMENT/PLAN  Follow-up MRI scheduled for around 11am this morning  Strict control of BP and neuro checks per post TPA protocol  A1C pending  Doppler ordered  PT/OT/SLP ordered  Will start anticoagulation with Xarelto for afib  Likely move out of 57M tomorrow if continued improvement  Janalyn Harder, PGY3 Pager: [phone] 07/19/2013 8:14 AM This patient is critically ill and at significant risk of neurological worsening, death and care requires constant monitoring of vital signs, hemodynamics,respiratory and cardiac monitoring,review of multiple databases, neurological assessment, discussion with family, other specialists and medical decision making of high complexity. I spent 30 minutes of neurocritical care time  in the care of  this patient. I have personally obtained a history, examined the patient, evaluated imaging results, and formulated the assessment and plan of care. I agree with the above. Delia Heady, MD

## 2013-07-19 NOTE — Progress Notes (Signed)
VASCULAR LAB PRELIMINARY  PRELIMINARY  PRELIMINARY  PRELIMINARY  Carotid duplex  completed.    Preliminary report:  Bilateral:  1-39% ICA stenosis.  Vertebral artery flow is antegrade.      Joshuajames Moehring, RVT 07/19/2013, 9:54 AM

## 2013-07-19 NOTE — Progress Notes (Signed)
PT Cancellation Note  Patient Details Name: [name] MRN: [id number] DOB: 11-25-1945   Cancelled Treatment:    Reason Eval/Treat Not Completed: Patient not medically ready (Pt currently on bedrest with TPA given.  Possible off bedrest this PM.  Will attempt to see if time allows and pt appropriate for PT in PM. )   [name] 07/19/2013, 8:50 AM  Jake Shark, PT DPT [phone]

## 2013-07-19 NOTE — Progress Notes (Signed)
Utilization review completed. Haliey Romberg, RN, BSN. 
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
## 2013-07-20 MED ORDER — LEVETIRACETAM 500 MG PO TABS
500.0000 mg | ORAL_TABLET | Freq: Two times a day (BID) | ORAL | Status: DC
  Administered 2013-07-20 – 2013-07-23 (×7): 500 mg via ORAL
  Filled 2013-07-20 (×9): qty 1

## 2013-07-20 MED ORDER — PHENOBARBITAL 32.4 MG PO TABS
32.4000 mg | ORAL_TABLET | Freq: Two times a day (BID) | ORAL | Status: DC
  Administered 2013-07-20 – 2013-07-23 (×7): 32.4 mg via ORAL
  Filled 2013-07-20 (×7): qty 1

## 2013-07-20 MED ORDER — LEVALBUTEROL HCL 1.25 MG/0.5ML IN NEBU
1.2500 mg | INHALATION_SOLUTION | Freq: Four times a day (QID) | RESPIRATORY_TRACT | Status: DC | PRN
  Filled 2013-07-20: qty 0.5

## 2013-07-20 NOTE — Clinical Social Work Psychosocial (Signed)
Clinical Social Work Department BRIEF PSYCHOSOCIAL ASSESSMENT 07/20/2013  Patient:  [name], [name]     Account Number:  [id number]     Admit date:  07/18/2013  Clinical Social Worker:  Cristy Folks  Date/Time:  07/20/2013 09:35 AM  Referred by:  Physician  Date Referred:  07/18/2013 Referred for  Psychosocial assessment   Other Referral:   Interview type:  Other - See comment Other interview type:   Spoke with pt. at bedside and wife vis phone.    PSYCHOSOCIAL DATA Living Status:  FACILITY Admitted from facility:  [facility], [place] Level of care:  Skilled Nursing Facility Primary support name:  Wife, Eber Jones [phone] Primary support relationship to patient:  FAMILY Degree of support available:   Good support    CURRENT CONCERNS Current Concerns  Post-Acute Placement   Other Concerns:    SOCIAL WORK ASSESSMENT / PLAN CSW spoke with pt. at bedside who confirmed he is from [facility] in [place]. Pt. answered questions appropriately however does suffer from dementia per RN. Contacted wife by phone who also confirmed pt. is from [facility] and agrees with plan at the time of discharge.   Assessment/plan status:  Other - See comment Other assessment/ plan:   Will contact SNF to update and confirm plan for d/c once stable.   Information/referral to community resources:   N/A    PATIENT'S/FAMILY'S RESPONSE TO PLAN OF CARE: Pt. was pleasant and agreeable to plan of care. Pt.'s wife was in agreement with the plan as well. CSW will follow up as necessary.   Reece Levy, MSW, Theresia Majors [phone]
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
## 2013-07-20 NOTE — Evaluation (Signed)
Physical Therapy Evaluation Patient Details Name: [name] MRN: [id number] DOB: 12/20/1945 Today's Date: 07/20/2013 Time: 4098-1191 PT Time Calculation (min): 41 min  PT Assessment / Plan / Recommendation History of Present Illness  67 yo man, history of HTN, multiple prior CVAs, HTN, seizures, OSA on CPAP, CAD s/p CABG, prior tobacco use (quit 9 yrs ago), presented 12/3 with worsening left sided weakness and slurred speech.  Symptom onset 9:30 AM, in ED 10:28 AM, given TPA 11:33 AM, due to NIHSS = 17 (unclear how much is old vs new).  CT head normal.  He was admitted to the neuro ICU for further evaluation and treatment.  MRI shows:  Large acute partially hemorrhagic infarct involving the medial right.   Clinical Impression  Pt admitted with the above. Pt currently with functional limitations due to the deficits listed below (see PT Problem List). Pt presents with left hemianopsia with ataxia left UE.  Pt easily distracted and needs cues to redirect to task.  Pt from SNF with hx of dementia.  Will continues to follow.  Pt will benefit from skilled PT to increase their independence and safety with mobility to allow discharge to the venue listed below.       PT Assessment  Patient needs continued PT services    Follow Up Recommendations  SNF    Barriers to Discharge        Equipment Recommendations  Rolling walker with 5" wheels    Recommendations for Other Services     Frequency Min 3X/week    Precautions / Restrictions Precautions Precautions: Fall Restrictions Weight Bearing Restrictions: No   Pertinent Vitals/Pain No c/o pain; VSS      Mobility  Bed Mobility Bed Mobility: Supine to Sit;Sitting - Scoot to Edge of Bed;Sit to Supine Supine to Sit: 1: +2 Total assist;HOB elevated Supine to Sit: Patient Percentage: 30% Sitting - Scoot to Edge of Bed: 1: +2 Total assist Sitting - Scoot to Edge of Bed: Patient Percentage: 40% Sit to Supine: 3: Mod assist Details for Bed  Mobility Assistance: (A) to elevate trunk OOB and to maintain overall balance with strong lean to left side throughout mobility.  Transfers Transfers: Sit to Stand;Stand to Sit Sit to Stand: 1: +2 Total assist;From chair/3-in-1 Sit to Stand: Patient Percentage: 50% Stand to Sit: 1: +2 Total assist;To chair/3-in-1 Stand to Sit: Patient Percentage: 50% Details for Transfer Assistance: (A) to initiate forward translation with correct midline due to lean to left side. Ambulation/Gait Ambulation/Gait Assistance: 1: +2 Total assist Ambulation/Gait: Patient Percentage: 50% Ambulation Distance (Feet): 2 Feet (side steps to [facility]. ) Assistive device: 2 person hand held assist Ambulation/Gait Assistance Details: (A) to maintain upright posture and midline with tactile cues at trunk and hips.  (A) with appropriate weight shift to advance bilateral LEs.  Gait Pattern: Step-to pattern;Shuffle Gait velocity: decrease Modified Rankin (Stroke Patients Only) Pre-Morbid Rankin Score: Moderate disability Modified Rankin: Severe disability    Exercises     PT Diagnosis: Difficulty walking;Generalized weakness  PT Problem List: Decreased strength;Decreased activity tolerance;Decreased balance;Decreased mobility;Decreased coordination;Decreased cognition;Decreased knowledge of use of DME;Decreased safety awareness PT Treatment Interventions: DME instruction;Gait training;Stair training;Functional mobility training;Therapeutic activities;Therapeutic exercise;Balance training;Neuromuscular re-education;Cognitive remediation;Patient/family education     PT Goals(Current goals can be found in the care plan section) Acute Rehab PT Goals Patient Stated Goal: None stated.   PT Goal Formulation: Patient unable to participate in goal setting Time For Goal Achievement: 08/03/13 Potential to Achieve Goals: Fair  Visit Information  Last  PT Received On: 07/20/13 Assistance Needed: +2 History of Present Illness: 67  yo man, history of HTN, multiple prior CVAs, HTN, seizures, OSA on CPAP, CAD s/p CABG, prior tobacco use (quit 9 yrs ago), presented 12/3 with worsening left sided weakness and slurred speech.  Symptom onset 9:30 AM, in ED 10:28 AM, given TPA 11:33 AM, due to NIHSS = 17 (unclear how much is old vs new).  CT head normal.  He was admitted to the neuro ICU for further evaluation and treatment.  MRI shows:  Large acute partially hemorrhagic infarct involving the medial right.        Prior Functioning  Home Living Family/patient expects to be discharged to:: Skilled nursing facility Living Arrangements: Spouse/significant other Type of Home: Skilled Nursing Facility Additional Comments: Pt came from [facility], [place] where has been since last admission on 06/20/13.  Prior to last admission pt was home with wife however frequently falling weekly.  Prior Function Level of Independence: Needs assistance Comments: No family available to report PLOF. Communication Communication: Expressive difficulties Dominant Hand: Right    Cognition  Cognition Arousal/Alertness: Awake/alert Behavior During Therapy: Restless Overall Cognitive Status: Impaired/Different from baseline Area of Impairment: Attention;Following commands;Safety/judgement;Awareness;Problem solving;Memory;Orientation Orientation Level: Disoriented to;Time Current Attention Level: Sustained Memory: Decreased recall of precautions;Decreased short-term memory Following Commands: Follows multi-step commands inconsistently;Follows multi-step commands with increased time Safety/Judgement: Decreased awareness of safety;Decreased awareness of deficits Awareness: Intellectual Problem Solving: Slow processing;Difficulty sequencing    Extremity/Trunk Assessment Upper Extremity Assessment Upper Extremity Assessment: Defer to OT evaluation Lower Extremity Assessment Lower Extremity Assessment: Overall WFL for tasks assessed LLE Deficits / Details: at  least 3/5 gross   Balance Balance Balance Assessed: Yes Static Sitting Balance Static Sitting - Balance Support: Feet supported;Feet unsupported Static Sitting - Level of Assistance: 2: Max assist;4: Min assist Static Sitting - Comment/# of Minutes: Intermittent (A) to prevent posterior and lean lean.  Pt pushing to left side at times.   Dynamic Sitting Balance Dynamic Sitting - Balance Support: Feet supported Dynamic Sitting - Level of Assistance: 3: Mod assist Dynamic Sitting Balance - Compensations: (A) to maintain balance with cues for direction.  Pt with spontaneous movements lateral especially to left side leaning forward.  Static Standing Balance Static Standing - Balance Support: Bilateral upper extremity supported Static Standing - Level of Assistance: 1: +2 Total assist;Patient percentage (comment) (50%)  End of Session PT - End of Session Equipment Utilized During Treatment: Gait belt Activity Tolerance: Patient tolerated treatment well;Patient limited by lethargy Patient left: in bed;with bed alarm set;with call bell/phone within reach (Pt will need sitter or full supervision to sit in chair. ) Nurse Communication: Mobility status  GP     Hendricks Schwandt 07/20/2013, 9:59 AM  Jake Shark, PT DPT [phone]

## 2013-07-20 NOTE — Progress Notes (Signed)
Occupational Therapy Evaluation Patient Details Name: Mare Ludtke MRN: [id number] DOB: [birth year] Today's Date: 07/20/2013 Time: 0950-1030 OT Time Calculation (min): 40 min  OT Assessment / Plan / Recommendation History of present illness 67 yo man, history of HTN, multiple prior CVAs, HTN, seizures, OSA on CPAP, CAD s/p CABG, prior tobacco use (quit 9 yrs ago), presented 12/3 with worsening left sided weakness and slurred speech.  Symptom onset 9:30 AM, in ED 10:28 AM, given TPA 11:33 AM, due to NIHSS = 17 (unclear how much is old vs new).  CT head normal.  He was admitted to the neuro ICU for further evaluation and treatment.  MRI shows:  Large acute partially hemorrhagic infarct involving the medial right.    Clinical Impression   PTA, pt at [facility] for rehab. Prior to that admission, pt had been living at [facility] with wife. 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 Pt will require extensive rehab. Feel rehab at SNF is best D/C plan at this time. Will follow acutely to address established goals. Thank you.    OT Assessment  Patient needs continued OT Services    Follow Up Recommendations  SNF    Barriers to Discharge Decreased caregiver support elderly wife  Equipment Recommendations  None recommended by OT    Recommendations for Other Services    Frequency  Min 2X/week    Precautions / Restrictions Precautions Precautions: Fall Precaution Comments: L homonymous hemianopsia. L alien arm Restrictions Weight Bearing Restrictions: No   Pertinent Vitals/Pain Vitals stable. Dyspnea2/4 with activity EOB    ADL  Eating/Feeding: Maximal assistance Where Assessed - Eating/Feeding: Edge of bed Grooming: Maximal assistance Where Assessed - Grooming: Supported sitting Upper Body Bathing: Maximal assistance Where Assessed - Upper Body Bathing: Supported  sitting Lower Body Bathing: +1 Total assistance Lower Body Bathing: Patient Percentage: 10% Where Assessed - Lower Body Bathing: Lean right and/or left;Supported sitting Upper Body Dressing: Maximal assistance Where Assessed - Upper Body Dressing: Supported sitting Lower Body Dressing: +1 Total assistance Lower Body Dressing: Patient Percentage: 10% Toilet Transfer: +2 Total assistance Transfers/Ambulation Related to ADLs: Will need +2 total A ADL Comments: Significantly impaired     OT Diagnosis: Generalized weakness;Cognitive deficits;Disturbance of vision;Hemiplegia non-dominant side  OT Problem List: Decreased strength;Decreased range of motion;Decreased activity tolerance;Impaired balance (sitting and/or standing);Impaired vision/perception;Decreased coordination;Decreased cognition;Decreased safety awareness;Decreased knowledge of use of DME or AE;Decreased knowledge of precautions;Cardiopulmonary status limiting activity;Impaired sensation;Impaired tone;Obesity;Impaired UE functional use OT Treatment Interventions: Self-care/ADL training;Therapeutic exercise;Neuromuscular education;DME and/or AE instruction;Therapeutic activities;Cognitive remediation/compensation;Visual/perceptual remediation/compensation;Patient/family education;Balance training   OT Goals(Current goals can be found in the care plan section) Acute Rehab OT Goals Patient Stated Goal: None stated.   OT Goal Formulation: Patient unable to participate in goal setting Time For Goal Achievement: 08/03/13 Potential to Achieve Goals: Good  Visit Information  Last OT Received On: 07/20/13 Assistance Needed: +2 History of Present Illness: 67 yo man, history of HTN, multiple prior CVAs, HTN, seizures, OSA on CPAP, CAD s/p CABG, prior tobacco use (quit 9 yrs ago), presented 12/3 with worsening left sided weakness and slurred speech.  Symptom onset 9:30 AM, in ED 10:28 AM, given TPA 11:33 AM, due to NIHSS = 17 (unclear how much  is old vs new).  CT head normal.  He was admitted to the neuro ICU for further evaluation and treatment.  MRI shows:  Large acute partially hemorrhagic  infarct involving the medial right.        Prior Functioning     Home Living Family/patient expects to be discharged to:: Skilled nursing facility Living Arrangements: Spouse/significant other Type of Home: Skilled Nursing Facility Additional Comments: Pt came from [facility] where has been since last admission on 06/20/13.  Prior to last admission pt was home with wife however frequently falling weekly.  Prior Function Level of Independence: Needs assistance Comments: No family available to report PLOF. Communication Communication: Expressive difficulties Dominant Hand: Right         Vision/Perception Vision - History Patient Visual Report: Other (comment) (reports half of face is dark but states that vision has not ) Vision - Assessment Eye Alignment: Within Functional Limits Vision Assessment: Vision tested Ocular Range of Motion: Restricted on the left;Restricted looking up;Restricted looking down Alignment/Gaze Preference: Gaze right;Head tilt Tracking/Visual Pursuits: Decreased smoothness of eye movement to LEFT superior field;Decreased smoothness of eye movement to LEFT inferior field;Requires cues, head turns, or add eye shifts to track;Unable to hold eye position out of midline Saccades: Additional head turns occurred during testing;Additional eye shifts occurred during testing;Decreased speed of saccadic movement Convergence: Impaired (comment) Visual Fields: Left homonymous hemianopsia Depth Perception: impaird. poor coordination Perception Perception: Impaired Inattention/Neglect: Does not attend to left visual field Body Scheme: appears distorted Spatial Orientation: umpaired Praxis Praxis: Impaired Praxis Impairment Details: Perseveration;Ideation Praxis-Other Comments: Pt trying to wipe his face with the  pulse ox cord rather than a washcloth   Cognition  Cognition Arousal/Alertness: Lethargic Behavior During Therapy: Restless;Flat affect Overall Cognitive Status: Impaired/Different from baseline Area of Impairment: Orientation;Attention;Memory;Following commands;Safety/judgement;Awareness;Problem solving Orientation Level: Disoriented to;Place Current Attention Level: Sustained Memory: Decreased recall of precautions;Decreased short-term memory Following Commands: Follows one step commands with increased time Safety/Judgement: Decreased awareness of safety;Decreased awareness of deficits Awareness: Intellectual Problem Solving: Slow processing;Difficulty sequencing;Requires verbal cues;Requires tactile cues    Extremity/Trunk Assessment Upper Extremity Assessment Upper Extremity Assessment: RUE deficits/detail;LUE deficits/detail RUE Deficits / Details: general weakness RUE Coordination: decreased fine motor LUE Deficits / Details: Lalien arm syndrome LUE Sensation: decreased light touch;decreased proprioception LUE Coordination: decreased fine motor;decreased gross motor Lower Extremity Assessment Lower Extremity Assessment: LLE deficits/detail LLE Deficits / Details: abnormal movements LLE Sensation: decreased light touch;decreased proprioception Cervical / Trunk Assessment Cervical / Trunk Assessment: Other exceptions Cervical / Trunk Exceptions: posterior and L bias. Max A to maintina midline postural control     Mobility Bed Mobility Bed Mobility: Rolling Right;Right Sidelying to Sit;Sitting - Scoot to [place] of Bed Rolling Right: 2: Max assist Right Sidelying to Sit: 2: Max assist Supine to Sit: 2: Max assist Supine to Sit: Patient Percentage: 30% Sitting - Scoot to Edge of Bed: 1: +2 Total assist Sitting - Scoot to Edge of Bed: Patient Percentage: 40% Sit to Supine: 2: Max assist Scooting to [facility]: 1: +2 Total assist Details for Bed Mobility Assistance: Assist needed due  to alien arm, postural control deficits and coginitive deficits and L field cut Transfers Transfers: Not assessed Sit to Stand: 1: +2 Total assist;From chair/3-in-1 Sit to Stand: Patient Percentage: 50% Stand to Sit: 1: +2 Total assist;To chair/3-in-1 Stand to Sit: Patient Percentage: 50% Details for Transfer Assistance: needs +2     Exercise     Balance Balance Balance Assessed: Yes Static Sitting Balance Static Sitting - Balance Support: Feet supported;Bilateral upper extremity supported Static Sitting - Level of Assistance: 2: Max assist;Other (comment) (posterior lean) Static Sitting - Comment/# of Minutes: Intermittent (A) to prevent  posterior and lean lean.  Pt pushing to left side at times.   Dynamic Sitting Balance Dynamic Sitting - Balance Support: Feet supported Dynamic Sitting - Level of Assistance: 3: Mod assist Dynamic Sitting Balance - Compensations: (A) to maintain balance with cues for direction.  Pt with spontaneous movements lateral especially to left side leaning forward.  Static Standing Balance Static Standing - Balance Support: Bilateral upper extremity supported Static Standing - Level of Assistance: 1: +2 Total assist;Patient percentage (comment) (50%)   End of Session OT - End of Session Activity Tolerance: Patient tolerated treatment well Patient left: in bed;with call bell/phone within reach;with nursing/sitter in room Nurse Communication: Mobility status;Need for lift equipment (will need maximove with nsg)  GO     Sarahbeth Cashin,HILLARY 07/20/2013, 10:39 AM Luisa Dago, OTR/L  [phone] 07/20/2013

## 2013-07-20 NOTE — Progress Notes (Addendum)
Speech Language Pathology Treatment: Dysphagia;Cognitive-Linquistic  Patient Details Name: [name] MRN: [id number] DOB: 1946-01-24 Today's Date: 07/20/2013 Time: 9147-8295 SLP Time Calculation (min): 31 min  Assessment / Plan / Recommendation Clinical Impression  Pt demonstrates adequate tolerance of thin liquids, often initiating required second swallow independently and otherwise responsive to verbal cues. Moderate verbal tactile cues needed for redirection to left visual field during functional task, finding objects on tray. Cueing decreased at end of meal with pt carrying over search into left side sometimes independently. Will downgrade diet texture to dys 3 as pt struggled with bolus size and some left pocketing observed.    HPI HPI: 67 y.o. 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 NIHSS 17, but I suspect some deficits are old from previous strokes. He has been given tPA. Pt has been seen by SLP for each admission for CVA, never found to have significant dysphagia.    Pertinent Vitals NA  SLP Plan  Continue with current plan of care    Recommendations Diet recommendations: Thin liquid;Dysphagia 3 (mechanical soft) Liquids provided via: Cup;No straw Medication Administration: Whole meds with puree Supervision: Staff to assist with self feeding Compensations: Multiple dry swallows after each bite/sip;Slow rate;Small sips/bites;Check for pocketing Postural Changes and/or Swallow Maneuvers: Seated upright 90 degrees              Oral Care Recommendations: Oral care BID Follow up Recommendations: Skilled Nursing facility Plan: Continue with current plan of care    GO    [facility] [place], [place] CCC-SLP [phone]  Claudine Mouton 07/20/2013, 9:54 AM

## 2013-07-21 MED ORDER — PANTOPRAZOLE SODIUM 40 MG PO TBEC
40.0000 mg | DELAYED_RELEASE_TABLET | Freq: Every day | ORAL | Status: DC
  Administered 2013-07-21 – 2013-07-22 (×2): 40 mg via ORAL
  Filled 2013-07-21 (×2): qty 1
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
## 2013-07-22 NOTE — Progress Notes (Signed)
Stroke Team Progress Note  HISTORY 67 yo man, history of HTN, multiple prior CVAs, HTN, seizures, OSA on CPAP, CAD s/p CABG, prior tobacco use (quit 9 yrs ago), presented 12/3 with worsening left sided weakness and slurred speech.  Symptom onset 9:30 AM, in ED 10:28 AM, given TPA 11:33 AM, due to NIHSS = 17 (unclear how much is old vs new).  CT head normal.  He was admitted to the neuro ICU for further evaluation and treatment.  SUBJECTIVE The patient remains confused. He has no complaints. He believes he is in a restaurant this morning. He does not appear agitated today.  OBJECTIVE Most recent Vital Signs: Filed Vitals:   07/21/13 1800 07/21/13 2146 07/22/13 0144 07/22/13 0512  BP: 148/96 141/87 140/90 142/90  Pulse: 93 78 89 81  Temp: 97.5 F (36.4 C) 98 F (36.7 C) 98.9 F (37.2 C) 97.6 F (36.4 C)  TempSrc: Oral Oral Oral Oral  Resp: 22 20 20 20   Height:      Weight:      SpO2: 98% 98% 99% 96%   CBG (last 3)  No results found for this basename: GLUCAP,  in the last 72 hours  IV Fluid Intake:   . sodium chloride 75 mL/hr at 07/22/13 0804    MEDICATIONS  . aspirin  325 mg Oral Daily  . levETIRAcetam  500 mg Oral Q12H  . pantoprazole  40 mg Oral QHS  . PHENobarbital  32.4 mg Oral BID   PRN:  acetaminophen, acetaminophen, labetalol, levalbuterol, senna-docusate  Diet:  Dysphagia 3 with thin liquids Activity:  Up with assistance, fall risk DVT Prophylaxis:  SCD's  CLINICALLY SIGNIFICANT STUDIES Basic Metabolic Panel:   Recent Labs Lab 07/18/13 1112  NA 137  K 3.8  CL 101  CO2 26  GLUCOSE 87  BUN 10  CREATININE 0.88  CALCIUM 9.3   Liver Function Tests:   Recent Labs Lab 07/18/13 1112  AST 13  ALT 13  ALKPHOS 101  BILITOT 0.3  PROT 7.2  ALBUMIN 3.4*   CBC:   Recent Labs Lab 07/18/13 1112  WBC 9.9  NEUTROABS 6.7  HGB 12.8*  HCT 38.0*  MCV 93.6  PLT 179   Coagulation:   Recent Labs Lab 07/18/13 1112  LABPROT 13.5  INR 1.05   Cardiac  Enzymes:   Recent Labs Lab 07/18/13 1112  TROPONINI <0.30   Lipid Panel    Component Value Date/Time   CHOL 137 07/19/2013 0353   TRIG 79 07/19/2013 0353   HDL 54 07/19/2013 0353   CHOLHDL 2.5 07/19/2013 0353   VLDL 16 07/19/2013 0353   LDLCALC 67 07/19/2013 0353   HgbA1C  Lab Results  Component Value Date   HGBA1C 5.9* 07/19/2013    Urine Drug Screen:     Component Value Date/Time   LABOPIA NONE DETECTED 06/25/2013 1400   COCAINSCRNUR NONE DETECTED 06/25/2013 1400   LABBENZ NONE DETECTED 06/25/2013 1400   AMPHETMU NONE DETECTED 06/25/2013 1400   THCU NONE DETECTED 06/25/2013 1400   LABBARB POSITIVE* 06/25/2013 1400     No results found.  CT of the brain   No acute infarct  MRI of the brain   Infarct R temporal, occipital, thalamus, with some hemorrhagic transformation (mild)  CTA of the brain   1. Similar appearance of diffuse atherosclerotic disease involving the cavernous carotid arteries and distal vertebral arteries bilaterally. The cavernous disease is worse on the right. The vertebral disease is worse on the left.  2.  Mild focal ectasia of the proximal basilar artery just distal to the vertebrobasilar junction.  3. Moderate stenosis of the proximal posterior cerebral arteries bilaterally with attenuation of distal branch vessels, right greater than left.  2D Echocardiogram   EF 50-55%, RA dilation  Carotid Doppler   Prelim neg  CXR   No acute abnormality  EKG  NSR, RBBB, no change from prior  Therapy Recommendations the physical and occupational therapists are recommending skilled nursing facility placement.  Physical Exam   Mental Status:  Confused, not oriented, speech slowed and somewhat slurred, no aphasia.  Pt follows simple commands inconsistently. Cranial Nerves:  II - complete left homonymous hemianopsia III/IV/VI - PERRL, EOMI right gaze preference but able to look to left past midline. V/VII - L lower facial droop VIII - hearing intact X -  palate elevates bilaterally Motor: strength 4/5 in left arm and leg with weakness of grip and intrinsic hand muscles. Mild drift LUE and LLE Sensory: decreased sensation and extinction on left Deep Tendon Reflexes: 1+ bilaterally Cerebellar: Normal finger-to-nose testing, though impaired on L due to strength Carotid auscultation: No bruit   ASSESSMENT [name] is a 67 y.o. male presenting with left sided weakness and slurred speech suspect Right Posterior cerebral artery infarct  of cardioeembolic etiology from atrial fibrillation. Status post IV t-PA at 11:33 (door to needle time 65 mins).  CT shows no acute infarct, MRI shows R temporal, occipital, thalamus infarct, mild hemorrhagic transformation.  Patient found to have afib (new diagnosis), which is likely the cause of the patient's stroke.  On aggrenox prior to admission. Now on aspirin 325 mg daily. Patient with resultant improvement in symptoms. Work up underway.     LDL = 67  A1C = 5.9  Afib seen on tele  Hypertension history  Previous CVAs  Reported history of seizures in the past - on phenobarbital.  History of dementia  Hospital day # 4  TREATMENT/PLAN  Strict control of BP and neuro checks per post TPA protocol  PT/OT as tolerated - skilled nursing facility placement recommended  Continue aspirin   Starting Xarelto early next week. ( Consider Coumadin in place of Xarelto secondary to phenobarbital per Dr Nona Dell)  Pt is resident of [facility] and would likely be d/c'd there.   Appreciate Child psychotherapist assistance.    Delton See PA-C [facility] Pager [phone] 07/22/2013, 8:58 AM   Mental status much improved this AM, agitation improved.  Pt has been on phenobarbital. As per family pt has monthly, what is suspected partial seizures and that is why he is on phenobarbital.  He needs to be on on anticoagulation. Phenobarbital interacts with newer anticoagulants  (xarelrto, pradaxa, etc..), would consider starting Coumadin early next week.  Pauletta Browns

## 2013-07-23 DIAGNOSIS — I4891 Unspecified atrial fibrillation: Secondary | ICD-10-CM

## 2013-07-23 MED ORDER — RIVAROXABAN 20 MG PO TABS: 20.0000 mg | ORAL_TABLET | Freq: Every day | ORAL | Status: DC

## 2013-07-23 MED ORDER — WARFARIN - PHYSICIAN DOSING INPATIENT: Freq: Every day | Status: DC

## 2013-07-23 MED ORDER — WARFARIN SODIUM 5 MG PO TABS
5.0000 mg | ORAL_TABLET | Freq: Every day | ORAL | Status: DC
  Filled 2013-07-23: qty 1

## 2013-07-23 MED ORDER — WARFARIN SODIUM 5 MG PO TABS: 5.0000 mg | ORAL_TABLET | Freq: Every day | ORAL | Status: DC

## 2013-07-23 NOTE — Progress Notes (Signed)
Clinical Social Work Department CLINICAL SOCIAL WORK PLACEMENT NOTE 07/23/2013  Patient:  [name], [name]  Account Number:  [id number] Admit date:  07/18/2013  Clinical Social Worker:  Leron Croak, CLINICAL SOCIAL WORKER  Date/time:  07/23/2013 02:17 PM  Clinical Social Work is seeking post-discharge placement for this patient at the following level of care:   SKILLED NURSING   (*CSW will update this form in Epic as items are completed)   07/20/2013  Patient/family provided with [facility] Department of Clinical Social Work's list of facilities offering this level of care within the geographic area requested by the patient (or if unable, by the patient's family).  07/20/2013  Patient/family informed of their freedom to choose among providers that offer the needed level of care, that participate in Medicare, Medicaid or managed care program needed by the patient, have an available bed and are willing to accept the patient.  07/20/2013  Patient/family informed of [facility]' ownership interest in [facility], as well as of the fact that they are under no obligation to receive care at this facility.  PASARR submitted to EDS on  PASARR number received from EDS on   FL2 transmitted to all facilities in geographic area requested by pt/family on  07/23/2013 FL2 transmitted to all facilities within larger geographic area on 07/23/2013  Patient informed that his/her managed care company has contracts with or will negotiate with  certain facilities, including the following:     Patient/family informed of bed offers received:   Patient chooses bed at [facility] Physician recommends and patient chooses bed at    Patient to be transferred to  on   Patient to be transferred to facility by   The following physician request were entered in Epic:   Additional Comments:   Lenord Carbo  [facility]  4N 1-16;  6N1-16 Phone:  [phone]

## 2013-07-23 NOTE — Progress Notes (Signed)
Report given to Sandria Bales, [facility] nurse. Updated AVS faxed to [phone] and  nurse  confirmed reception at the facility. Atrial fibrillation and stroke education provided to Spouse, Berdine Dance, at the bedside at the time of D/C. Expressed concerns about warfarin versus Xarelto as stroke MD told her Warfarin is not going to be prescribed as anticoagulant. Provided explanation of possible drugs interaction with phenobarbital as mentioned in Neurologist notes. Verbalized understanding. Pt transferred out via PTAR in stable condition.

## 2013-07-23 NOTE — Progress Notes (Signed)
CSW spoke with Bonita Quin at [facility] and Pt can d/c to facility once all documentation (FL2 and AVS) is completed.   CSW will continue to work Pt up for d/c to [employer].     Leron Croak [facility]  4N 1-16;  [phone] Phone: [phone]

## 2013-07-23 NOTE — Progress Notes (Signed)
CSW was contacted by Geanie Berlin from [facility] who stated they did not get d/c summary.   CSW faxed twice.   CSW also faxed updated d/c summary as the MD needed to do a med change.    CSW informed Tammy that all information was sent.   No further needs.    Leron Croak [facility]  4N 1-16;  [phone] Phone: [phone]
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
## 2013-07-23 NOTE — Progress Notes (Signed)
Physical Therapy Treatment Patient Details Name: [name] MRN: [id number] DOB: April 19, 1946 Today's Date: 07/23/2013 Time: 1443-1500 PT Time Calculation (min): 17 min  PT Assessment / Plan / Recommendation  History of Present Illness 67 yo man, history of HTN, multiple prior CVAs, HTN, seizures, OSA on CPAP, CAD s/p CABG, prior tobacco use (quit 9 yrs ago), presented 12/3 with worsening left sided weakness and slurred speech.  Symptom onset 9:30 AM, in ED 10:28 AM, given TPA 11:33 AM, due to NIHSS = 17 (unclear how much is old vs new).  CT head normal.  He was admitted to the neuro ICU for further evaluation and treatment.  MRI shows:  Large acute partially hemorrhagic infarct involving the medial right.    PT Comments   Patient continues to demonstrate significant deficits in functional mobility. Patient with increased posterior lean/pushers today. Patient unable to tolerate EOB without max assist, performed x5 with intermittant supine reclined in bed positioning in between trials.  Will continue to see and progress as tolerated.        Follow Up Recommendations  SNF           Equipment Recommendations  Rolling walker with 5" wheels       Frequency Min 3X/week   Progress towards PT Goals  Not progressing towards PT goals   Plan Current plan remains appropriate    Precautions / Restrictions Precautions Precautions: Fall   Pertinent Vitals/Pain No pain at this time    Mobility  Bed Mobility Bed Mobility: Supine to Sit;Sitting - Scoot to Edge of Bed;Sit to Supine Supine to Sit: 1: +2 Total assist;HOB elevated Supine to Sit: Patient Percentage: 30% Sitting - Scoot to Edge of Bed: 1: +2 Total assist Sitting - Scoot to Edge of Bed: Patient Percentage: 40% Sit to Supine: 3: Mod assist Details for Bed Mobility Assistance: Patient with significant pushers while attempting bed mobility and EOB.  Transfers Transfers: Not assessed Ambulation/Gait Ambulation/Gait Assistance: Not  tested (comment) Modified Rankin (Stroke Patients Only) Pre-Morbid Rankin Score: Moderate disability Modified Rankin: Severe disability      PT Goals (current goals can now be found in the care plan section) Acute Rehab PT Goals Patient Stated Goal: None stated.   PT Goal Formulation: Patient unable to participate in goal setting Time For Goal Achievement: 08/03/13 Potential to Achieve Goals: Fair  Visit Information  Last PT Received On: 07/23/13 Assistance Needed: +2 History of Present Illness: 67 yo man, history of HTN, multiple prior CVAs, HTN, seizures, OSA on CPAP, CAD s/p CABG, prior tobacco use (quit 9 yrs ago), presented 12/3 with worsening left sided weakness and slurred speech.  Symptom onset 9:30 AM, in ED 10:28 AM, given TPA 11:33 AM, due to NIHSS = 17 (unclear how much is old vs new).  CT head normal.  He was admitted to the neuro ICU for further evaluation and treatment.  MRI shows:  Large acute partially hemorrhagic infarct involving the medial right.     Subjective Data  Patient Stated Goal: None stated.     Cognition  Cognition Arousal/Alertness: Awake/alert Behavior During Therapy: Restless Overall Cognitive Status: Impaired/Different from baseline Area of Impairment: Attention;Following commands;Safety/judgement;Awareness;Problem solving;Memory;Orientation Orientation Level: Disoriented to;Time Current Attention Level: Sustained Memory: Decreased recall of precautions;Decreased short-term memory Following Commands: Follows multi-step commands inconsistently;Follows multi-step commands with increased time Safety/Judgement: Decreased awareness of safety;Decreased awareness of deficits Awareness: Intellectual Problem Solving: Slow processing;Difficulty sequencing    Balance  Balance Balance Assessed: Yes Static Sitting Balance Static Sitting -  Balance Support: Feet supported;Feet unsupported Static Sitting - Level of Assistance: 2: Max assist;4: Min  assist Static Sitting - Comment/# of Minutes: significant posterior lean with EOB, performed x5  End of Session PT - End of Session Equipment Utilized During Treatment: Gait belt Activity Tolerance: Patient tolerated treatment well;Patient limited by lethargy Patient left: in bed;with bed alarm set;with call bell/phone within reach (Pt will need sitter or full supervision to sit in chair. ) Nurse Communication: Mobility status   GP     Fabio Asa 07/23/2013, 4:14 PM Charlotte Crumb, PT DPT  [phone]

## 2013-07-23 NOTE — Progress Notes (Addendum)
Spoke with PA who states that Pt medically ready for d/c and would like to know if Pt can return today.   CSW sent a message via text asking in Pt can d/c to facility today.  CSW received a message back that Pt can return to faclity.   CSW has sent clinicals and will inform PA.   CSW working on d/c plan to [employer] today.   CSW spoke with wife and Pt's wife is in agreement of d/c plan.   CSW will prepare packet for d/c and informed PA that CSW will need a signature on FL2 for d/c.   Leron Croak [facility]  4N 1-16;  6N1-16 Phone: [phone]    FL2 Faxed to [phone].    Leron Croak [facility]  4N 1-16;  [phone] Phone: [phone]

## 2013-07-23 NOTE — Progress Notes (Signed)
CSW faxed D/C sum and AVS by [facility]. CSW informed nurse of packet and Pt d/c today.   Numbers for facility and PTAR on Packet in Pt draw.   No further needs.    [name] [facility]  4N 1-16;  [phone] Phone: [phone]

## 2013-07-23 NOTE — Progress Notes (Signed)
UR complete.  Nicholous Girgenti RN, MSN 

## 2013-07-24 ENCOUNTER — Encounter: Payer: Self-pay | Admitting: Internal Medicine

## 2013-07-24 ENCOUNTER — Other Ambulatory Visit: Payer: Self-pay | Admitting: *Deleted

## 2013-07-24 ENCOUNTER — Non-Acute Institutional Stay (SKILLED_NURSING_FACILITY): Payer: Medicare Other | Admitting: Internal Medicine

## 2013-07-24 DIAGNOSIS — N3281 Overactive bladder: Secondary | ICD-10-CM

## 2013-07-24 DIAGNOSIS — R5381 Other malaise: Secondary | ICD-10-CM

## 2013-07-24 DIAGNOSIS — F3289 Other specified depressive episodes: Secondary | ICD-10-CM

## 2013-07-24 DIAGNOSIS — E785 Hyperlipidemia, unspecified: Secondary | ICD-10-CM

## 2013-07-24 DIAGNOSIS — F329 Major depressive disorder, single episode, unspecified: Secondary | ICD-10-CM

## 2013-07-24 DIAGNOSIS — Z9989 Dependence on other enabling machines and devices: Secondary | ICD-10-CM

## 2013-07-24 DIAGNOSIS — G4733 Obstructive sleep apnea (adult) (pediatric): Secondary | ICD-10-CM

## 2013-07-24 DIAGNOSIS — N318 Other neuromuscular dysfunction of bladder: Secondary | ICD-10-CM

## 2013-07-24 DIAGNOSIS — I4891 Unspecified atrial fibrillation: Secondary | ICD-10-CM

## 2013-07-24 DIAGNOSIS — Z5181 Encounter for therapeutic drug level monitoring: Secondary | ICD-10-CM

## 2013-07-24 DIAGNOSIS — F0150 Vascular dementia without behavioral disturbance: Secondary | ICD-10-CM

## 2013-07-24 DIAGNOSIS — I6350 Cerebral infarction due to unspecified occlusion or stenosis of unspecified cerebral artery: Secondary | ICD-10-CM

## 2013-07-24 DIAGNOSIS — R569 Unspecified convulsions: Secondary | ICD-10-CM

## 2013-07-24 DIAGNOSIS — Z7901 Long term (current) use of anticoagulants: Secondary | ICD-10-CM

## 2013-07-24 DIAGNOSIS — F32A Depression, unspecified: Secondary | ICD-10-CM

## 2013-07-24 DIAGNOSIS — I674 Hypertensive encephalopathy: Secondary | ICD-10-CM

## 2013-07-24 DIAGNOSIS — F0280 Dementia in other diseases classified elsewhere without behavioral disturbance: Secondary | ICD-10-CM

## 2013-07-24 DIAGNOSIS — IMO0002 Reserved for concepts with insufficient information to code with codable children: Secondary | ICD-10-CM

## 2013-07-24 DIAGNOSIS — F0393 Unspecified dementia, unspecified severity, with mood disturbance: Secondary | ICD-10-CM

## 2013-07-24 MED ORDER — PHENOBARBITAL 32.4 MG PO TABS: ORAL_TABLET | ORAL | Status: DC
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
## 2013-07-26 ENCOUNTER — Other Ambulatory Visit (HOSPITAL_COMMUNITY): Payer: Self-pay | Admitting: Internal Medicine

## 2013-07-26 DIAGNOSIS — R1310 Dysphagia, unspecified: Secondary | ICD-10-CM

## 2013-07-27 ENCOUNTER — Emergency Department (HOSPITAL_COMMUNITY): Payer: Medicare Other

## 2013-07-27 ENCOUNTER — Inpatient Hospital Stay (HOSPITAL_COMMUNITY)
Admission: EM | Admit: 2013-07-27 | Discharge: 2013-07-30 | DRG: 065 | Disposition: A | Discharge status: Skilled Nursing Facility | Payer: Medicare Other | Attending: Internal Medicine | Admitting: Internal Medicine

## 2013-07-27 ENCOUNTER — Non-Acute Institutional Stay (SKILLED_NURSING_FACILITY): Payer: Medicare Other | Admitting: Internal Medicine

## 2013-07-27 ENCOUNTER — Encounter (HOSPITAL_COMMUNITY): Payer: Self-pay | Admitting: Emergency Medicine

## 2013-07-27 ENCOUNTER — Inpatient Hospital Stay (HOSPITAL_COMMUNITY): Payer: Medicare Other

## 2013-07-27 DIAGNOSIS — F0391 Unspecified dementia with behavioral disturbance: Secondary | ICD-10-CM | POA: Diagnosis present

## 2013-07-27 DIAGNOSIS — I639 Cerebral infarction, unspecified: Secondary | ICD-10-CM | POA: Diagnosis present

## 2013-07-27 DIAGNOSIS — I10 Essential (primary) hypertension: Secondary | ICD-10-CM | POA: Diagnosis present

## 2013-07-27 DIAGNOSIS — I4891 Unspecified atrial fibrillation: Secondary | ICD-10-CM | POA: Diagnosis present

## 2013-07-27 DIAGNOSIS — Z87891 Personal history of nicotine dependence: Secondary | ICD-10-CM

## 2013-07-27 DIAGNOSIS — G8190 Hemiplegia, unspecified affecting unspecified side: Secondary | ICD-10-CM | POA: Diagnosis present

## 2013-07-27 DIAGNOSIS — R1310 Dysphagia, unspecified: Secondary | ICD-10-CM | POA: Diagnosis present

## 2013-07-27 DIAGNOSIS — Z66 Do not resuscitate: Secondary | ICD-10-CM | POA: Diagnosis present

## 2013-07-27 DIAGNOSIS — E785 Hyperlipidemia, unspecified: Secondary | ICD-10-CM | POA: Diagnosis present

## 2013-07-27 DIAGNOSIS — H539 Unspecified visual disturbance: Secondary | ICD-10-CM | POA: Diagnosis present

## 2013-07-27 DIAGNOSIS — R05 Cough: Secondary | ICD-10-CM

## 2013-07-27 DIAGNOSIS — G4733 Obstructive sleep apnea (adult) (pediatric): Secondary | ICD-10-CM | POA: Diagnosis present

## 2013-07-27 DIAGNOSIS — G608 Other hereditary and idiopathic neuropathies: Secondary | ICD-10-CM | POA: Diagnosis present

## 2013-07-27 DIAGNOSIS — Z8673 Personal history of transient ischemic attack (TIA), and cerebral infarction without residual deficits: Secondary | ICD-10-CM

## 2013-07-27 DIAGNOSIS — R569 Unspecified convulsions: Secondary | ICD-10-CM

## 2013-07-27 DIAGNOSIS — F02818 Dementia in other diseases classified elsewhere, unspecified severity, with other behavioral disturbance: Secondary | ICD-10-CM | POA: Diagnosis present

## 2013-07-27 DIAGNOSIS — Z951 Presence of aortocoronary bypass graft: Secondary | ICD-10-CM

## 2013-07-27 DIAGNOSIS — I2510 Atherosclerotic heart disease of native coronary artery without angina pectoris: Secondary | ICD-10-CM | POA: Diagnosis present

## 2013-07-27 DIAGNOSIS — I6350 Cerebral infarction due to unspecified occlusion or stenosis of unspecified cerebral artery: Secondary | ICD-10-CM

## 2013-07-27 DIAGNOSIS — I4510 Unspecified right bundle-branch block: Secondary | ICD-10-CM | POA: Diagnosis present

## 2013-07-27 DIAGNOSIS — Z823 Family history of stroke: Secondary | ICD-10-CM

## 2013-07-27 DIAGNOSIS — F0280 Dementia in other diseases classified elsewhere without behavioral disturbance: Secondary | ICD-10-CM | POA: Diagnosis present

## 2013-07-27 DIAGNOSIS — R4182 Altered mental status, unspecified: Secondary | ICD-10-CM

## 2013-07-27 DIAGNOSIS — G309 Alzheimer's disease, unspecified: Secondary | ICD-10-CM | POA: Diagnosis present

## 2013-07-27 DIAGNOSIS — I252 Old myocardial infarction: Secondary | ICD-10-CM

## 2013-07-27 DIAGNOSIS — I161 Hypertensive emergency: Secondary | ICD-10-CM

## 2013-07-27 DIAGNOSIS — F03918 Unspecified dementia, unspecified severity, with other behavioral disturbance: Secondary | ICD-10-CM | POA: Diagnosis present

## 2013-07-27 DIAGNOSIS — F0281 Dementia in other diseases classified elsewhere with behavioral disturbance: Secondary | ICD-10-CM

## 2013-07-27 DIAGNOSIS — E538 Deficiency of other specified B group vitamins: Secondary | ICD-10-CM | POA: Diagnosis present

## 2013-07-27 DIAGNOSIS — I6340 Cerebral infarction due to embolism of unspecified cerebral artery: Secondary | ICD-10-CM

## 2013-07-27 DIAGNOSIS — Z79899 Other long term (current) drug therapy: Secondary | ICD-10-CM

## 2013-07-27 DIAGNOSIS — Z7901 Long term (current) use of anticoagulants: Secondary | ICD-10-CM

## 2013-07-27 DIAGNOSIS — G40309 Generalized idiopathic epilepsy and epileptic syndromes, not intractable, without status epilepticus: Secondary | ICD-10-CM | POA: Diagnosis present

## 2013-07-27 HISTORY — DX: Dysphagia, unspecified: R13.10

## 2013-07-27 HISTORY — DX: Unspecified atrial fibrillation: I48.91

## 2013-07-27 HISTORY — DX: Unspecified dementia with behavioral disturbance: F03.91

## 2013-07-27 HISTORY — DX: Unspecified dementia, unspecified severity, with other behavioral disturbance: F03.918

## 2013-07-27 LAB — CBC WITH DIFFERENTIAL/PLATELET
Eosinophils Relative: 0 % (ref 0–5)
HCT: 39 % (ref 39.0–52.0)
Hemoglobin: 13.6 g/dL (ref 13.0–17.0)
Lymphocytes Relative: 7 % — ABNORMAL LOW (ref 12–46)
Lymphs Abs: 0.9 10*3/uL (ref 0.7–4.0)
MCV: 92.2 fL (ref 78.0–100.0)
Monocytes Absolute: 0.9 10*3/uL (ref 0.1–1.0)
Monocytes Relative: 8 % (ref 3–12)
Neutro Abs: 10.3 10*3/uL — ABNORMAL HIGH (ref 1.7–7.7)
RBC: 4.23 MIL/uL (ref 4.22–5.81)
RDW: 14.6 % (ref 11.5–15.5)
WBC: 12.1 10*3/uL — ABNORMAL HIGH (ref 4.0–10.5)

## 2013-07-27 LAB — COMPREHENSIVE METABOLIC PANEL
Albumin: 3.6 g/dL (ref 3.5–5.2)
BUN: 9 mg/dL (ref 6–23)
CO2: 22 mEq/L (ref 19–32)
Calcium: 9.5 mg/dL (ref 8.4–10.5)
Chloride: 100 mEq/L (ref 96–112)
Creatinine, Ser: 0.77 mg/dL (ref 0.50–1.35)
GFR calc Af Amer: >90 mL/min (ref >90)
GFR calc non Af Amer: >90 mL/min (ref >90)
Glucose, Bld: 121 mg/dL — ABNORMAL HIGH (ref 70–99)
Total Bilirubin: 0.5 mg/dL (ref 0.3–1.2)

## 2013-07-27 LAB — URINALYSIS W MICROSCOPIC + REFLEX CULTURE
Bilirubin Urine: NEGATIVE
Glucose, UA: NEGATIVE mg/dL
Ketones, ur: 40 mg/dL — AB
Leukocytes, UA: NEGATIVE
Nitrite: NEGATIVE
Protein, ur: 30 mg/dL — AB
Urobilinogen, UA: 1 mg/dL (ref 0.0–1.0)
pH: 7.5 (ref 5.0–8.0)

## 2013-07-27 LAB — TROPONIN I: Troponin I: <0.3 ng/mL (ref <0.30)

## 2013-07-27 LAB — PROTIME-INR
INR: 1.64 — ABNORMAL HIGH (ref 0.00–1.49)
Prothrombin Time: 19 seconds — ABNORMAL HIGH (ref 11.6–15.2)

## 2013-07-27 LAB — LACTIC ACID, PLASMA: Lactic Acid, Venous: 1.5 mmol/L (ref 0.5–2.2)

## 2013-07-27 LAB — PHENOBARBITAL LEVEL: Phenobarbital: 17.2 ug/mL (ref 15.0–40.0)

## 2013-07-27 LAB — GLUCOSE, CAPILLARY: Glucose-Capillary: 101 mg/dL — ABNORMAL HIGH (ref 70–99)

## 2013-07-27 LAB — VALPROIC ACID LEVEL: Valproic Acid Lvl: 11.4 ug/mL — ABNORMAL LOW (ref 50.0–100.0)

## 2013-07-27 MED ORDER — MORPHINE SULFATE 2 MG/ML IJ SOLN: 2.0000 mg | INTRAMUSCULAR | Status: DC | PRN

## 2013-07-27 MED ORDER — SODIUM CHLORIDE 0.9 % IV SOLN
INTRAVENOUS | Status: DC
  Administered 2013-07-27 – 2013-07-30 (×5): via INTRAVENOUS

## 2013-07-27 MED ORDER — WARFARIN - PHARMACIST DOSING INPATIENT: Freq: Every day | Status: DC

## 2013-07-27 MED ORDER — ACETAMINOPHEN 325 MG PO TABS
650.0000 mg | ORAL_TABLET | Freq: Four times a day (QID) | ORAL | Status: DC | PRN
  Administered 2013-07-29: 650 mg via ORAL
  Filled 2013-07-27: qty 2

## 2013-07-27 MED ORDER — ENOXAPARIN SODIUM 40 MG/0.4ML ~~LOC~~ SOLN
40.0000 mg | SUBCUTANEOUS | Status: DC
  Administered 2013-07-27: 40 mg via SUBCUTANEOUS
  Filled 2013-07-27 (×2): qty 0.4

## 2013-07-27 MED ORDER — METOPROLOL TARTRATE 1 MG/ML IV SOLN
5.0000 mg | Freq: Four times a day (QID) | INTRAVENOUS | Status: DC
  Administered 2013-07-27 – 2013-07-29 (×8): 5 mg via INTRAVENOUS
  Filled 2013-07-27 (×13): qty 5

## 2013-07-27 MED ORDER — SODIUM CHLORIDE 0.9 % IV SOLN
500.0000 mg | Freq: Two times a day (BID) | INTRAVENOUS | Status: DC
  Administered 2013-07-27 – 2013-07-29 (×4): 500 mg via INTRAVENOUS
  Filled 2013-07-27 (×5): qty 5

## 2013-07-27 MED ORDER — SODIUM CHLORIDE 0.9 % IJ SOLN
3.0000 mL | Freq: Two times a day (BID) | INTRAMUSCULAR | Status: DC
  Administered 2013-07-27 – 2013-07-30 (×3): 3 mL via INTRAVENOUS

## 2013-07-27 MED ORDER — ACETAMINOPHEN 650 MG RE SUPP: 650.0000 mg | Freq: Four times a day (QID) | RECTAL | Status: DC | PRN

## 2013-07-27 MED ORDER — SODIUM CHLORIDE 0.9 % IV SOLN
INTRAVENOUS | Status: AC
  Administered 2013-07-27: 17:00:00 via INTRAVENOUS

## 2013-07-27 MED ORDER — LORAZEPAM 2 MG/ML IJ SOLN
0.5000 mg | Freq: Once | INTRAMUSCULAR | Status: AC
  Administered 2013-07-27: 0.5 mg via INTRAVENOUS
  Filled 2013-07-27: qty 1

## 2013-07-27 MED ORDER — VALPROATE SODIUM 500 MG/5ML IV SOLN
250.0000 mg | Freq: Two times a day (BID) | INTRAVENOUS | Status: DC
  Administered 2013-07-27 – 2013-07-28 (×4): 250 mg via INTRAVENOUS
  Filled 2013-07-27 (×7): qty 2.5

## 2013-07-27 MED ORDER — WARFARIN SODIUM 7.5 MG PO TABS
7.5000 mg | ORAL_TABLET | Freq: Once | ORAL | Status: DC
  Filled 2013-07-27: qty 1

## 2013-07-27 MED ORDER — PHENOBARBITAL SODIUM 65 MG/ML IJ SOLN
65.0000 mg | Freq: Two times a day (BID) | INTRAMUSCULAR | Status: DC
  Administered 2013-07-27 – 2013-07-28 (×3): 65 mg via INTRAVENOUS
  Filled 2013-07-27 (×4): qty 1

## 2013-07-27 NOTE — ED Notes (Signed)
Attempted to give report was told to call back, Charge and the nurse getting the pt was at lunch.
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
## 2013-07-27 NOTE — Progress Notes (Signed)
Patient ID: [name], male   DOB: 1945-09-27, 67 y.o.   MRN: [id number]  Renette Butters living [place]  No Known Allergies  Chief Complaint  Patient presents with  . Acute Visit    hypertensive emergency  . Altered Mental Status    HPI 67 y/o male patient with history of recent CVA was admitted here for STR. He has hx of afib, seizure and dementia among others. This morning he was noted to be less interactive and his vitals when checked showed elevated bp reading. i see the patient lying in bed. He has intermittent jerky movement of his left hand. He is alert only to person. He opens his eyes to name call but does not follow command. As per staff, patient was able to communicate and make his needs known yesterday. He denies any chest pain or shortness of breath but his o2 sat has dropped to 785 on room air. He was put on o2 by nasal canula at 4 litres and his o2 sat is now around 96%.  He has not received his seizure and bp medication this am  ROS Unable to obtain from patient No fever, chills, falls reported by staff Speech therapy was working with him this am but patient not following commands and has not eaten his breakfast. He has new productive cough noted both with and without meals  Past Medical History  Diagnosis Date  . Hypertension   . Seizures     partial and generalized, poor compliance  . Myocardial infarction   . Unspecified hereditary and idiopathic peripheral neuropathy   . Vitamin B12 deficiency   . High blood pressure   . Ataxia   . Thoracic or lumbosacral neuritis or radiculitis, unspecified   . Coronary atherosclerosis of artery bypass graft   . Memory loss   . Generalized convulsive epilepsy without mention of intractable epilepsy   . Obstructive sleep apnea on CPAP   . Weakness of left side of body     S/P CVA  . Stroke     Multiple  . Stroke     received TPA 07/18/2013  . Atrial fibrillation   . Dementia with behavioral disturbance   . Dysphagia     Reviewed medication in facility  No new med changes  Physical exam  BP 216/129  Pulse 117  Temp(Src) 97.9 F (36.6 C)  Resp 22  SpO2 88%  General- elderly male lethargic and minimally responsive, he has fixed stare and mentions having blurry vision Head- atraumatic, normocephalic Eyes- fixed stare, pupils minimally reactive to light Neck- no lymphadenopathy, no jugular vein distension Chest- no chest wall deformities, no chest wall tenderness Cardiovascular- normal s1,s2, tachycardic Respiratory-  right > left  Decreased air entry, no wheeze or rhonchi Abdomen- bowel sounds present, soft, non tender Musculoskeletal- jerky involuntary movement in left arm Neurological- not following command Psychiatry- oriented to person intermittently  Assessment/plan  New CVA- in patient with recent CVA thought to be embolic in setting of afib and now having hypertensive emergency and altered mental status of acute onset, i have concerns for another stroke. Will send him to the ED to rule out hemorrhagic stroke. He is on coumadin with today's inr level pending  Seizure- new intermittent jerks on left side and elevated bp reading raising concern for seizure in setting of hypertensive emergency  Hypertensive emergency- with elevated bp and AMS, will send him to ED to be admitted to the hospital for bp monitoring and titration of bp meds under monitored  setting. Will need to also assess for cardiac damage in setting of this hypertensive emergency  Purulent cough- new onset in pt with recent stroke raising concern for aspiration pneumonitis vs pneumonia. Decreased air entry but no wheeze or rhonci Will need SLP evaulation  Called EMS, sign out report provided, care plan discussed with nursing staff and supervisor  Spent more than 50 minutes in arrnaging for care and transfer of patient

## 2013-07-27 NOTE — ED Notes (Signed)
Pharmacist at bedside.

## 2013-07-27 NOTE — ED Notes (Signed)
Pt has been at [facility] had a CVA a week ago. Pt has residual left sided weakness. Today facility has been reporting that hyptensive and is unable to track stating he was unable to see EMS. Facility states that pt has had flaling and tension on the left side. Pt has had increasing confusion.  BP- 180/130 HR-60-110 Afib SpO2 - 100 RA  CBG 120 Pt was recently placed on coumadin for CVA.  20 g L AC No meds given today

## 2013-07-27 NOTE — ED Notes (Signed)
Dr Chiu at bedside.  

## 2013-07-27 NOTE — Consult Note (Signed)
Referring Physician: Dr. Clarene Duke    Chief Complaint: Increasing confusion and acute worsening of visual abnormalities.  HPI: [name] is an 67 y.o. male history of atrial fibrillation on Coumadin, acute stroke on 07/18/2013 treated with TPA, hypertension, hyperlipidemia and dementia who was noted to be more confused and complaining of total loss of vision acutely. Patient was discharged to a skilled nursing facility following his recent admission. Stroke on 07/18/2013 involved his right occipital region and patient had left visual loss and left-sided weakness. CT scan of his head today showed an area of acute infarction involving his left parietal region, in addition to evolving changes in his recent stroke involving the right occipital region with possible petechial hemorrhaging. INR is pending.  LSN: 07/26/2013, evening, unclear time. tPA Given: No: TPA given on 07/18/2013. MRankin: 4  Past Medical History  Diagnosis Date  . Hypertension   . Seizures     partial and generalized, poor compliance  . Myocardial infarction   . Unspecified hereditary and idiopathic peripheral neuropathy   . Vitamin B12 deficiency   . High blood pressure   . Ataxia   . Thoracic or lumbosacral neuritis or radiculitis, unspecified   . Coronary atherosclerosis of artery bypass graft   . Memory loss   . Generalized convulsive epilepsy without mention of intractable epilepsy   . Obstructive sleep apnea on CPAP   . Weakness of left side of body     S/P CVA  . Stroke     Multiple  . Stroke     received TPA 07/18/2013  . Atrial fibrillation   . Dementia with behavioral disturbance   . Dysphagia     History reviewed. No pertinent family history.   Medications: I have reviewed the patient's current medications.   Physical Examination: There were no vitals taken for this visit.  Neurologic Examination: Patient was somnolent and difficult to arouse. Speech was markedly slurred. He was able to follow  occasional simple commands. Pupils were unequal, left greater than right. Left pupil reacted to light. Reaction of right pupil to light was equivocal. Extraocular movements were conjugate with left lateral gaze. Patient would not look to the right. Eyes were midline and conjugate at rest. Patient was not alert enough to assess visual fields. No facial weakness was noted. Motor exam showed reduced movements of left extremities compared to the right. Hand grip on the left was slightly reduced compared to the right. Muscle tone was flaccid throughout. Deep tendon reflexes were 2+ and symmetrical. Plantar responses on the left was extensor on the right mute.  Ct Head Wo Contrast  07/27/2013   CLINICAL DATA:  Decreased level of consciousness. Stroke 1 week ago. Increasing confusion.  EXAM: CT HEAD WITHOUT CONTRAST  TECHNIQUE: Contiguous axial images were obtained from the base of the skull through the vertex without intravenous contrast.  COMPARISON:  Brain MRI 07/19/2013 and head CT 07/18/2012  FINDINGS: Hypoattenuation with loss of gray-white differentiation in the medial right temporal and right occipital lobes corresponds to acute PCA infarct described on recent MRI. A small amount of hyperattenuation within the region of infarct may reflect a small amount of petechial hemorrhage or preserved gray matter. There is minimal mass effect on the occipital horn of the right lateral ventricle. There is no midline shift.  There is new hypoattenuation measuring approximately 3 x 3 cm involving the left parietal lobe consistent with new infarct (series 2, image 24). There is moderate generalized cerebral atrophy, advanced for age. Periventricular white  matter hypodensities are similar to the prior exam and consistent with advanced chronic small vessel ischemic disease. There is no extra-axial fluid collection. Orbits are unremarkable. Visualized paranasal sinuses and mastoid air cells are clear.  IMPRESSION: 1. New,  acute left parietal infarct. 2. Evolving right PCA infarct. These results were called by telephone at the time of interpretation on 07/27/2013 at 11:03 AM to Dr. Samuel Jester , who verbally acknowledged these results.   Electronically Signed   By: Sebastian Ache   On: 07/27/2013 11:05    Assessment: 67 y.o. male with atrial fibrillation on Coumadin, hypertension, hyperlipidemia and recent right occipital stroke, presenting with new left parietal ischemic stroke.  Stroke Risk Factors - atrial fibrillation, hyperlipidemia and hypertension  Plan: 1. MRI of the brain without contrast 2. PT consult, OT consult, Speech consult 3. Prophylactic therapy-Anticoagulation: Coumadin 4. Risk factor modification 5. Telemetry monitoring   [name] [name], MD [facility] [phone]  07/27/2013, 11:39 AM

## 2013-07-27 NOTE — ED Notes (Signed)
MD at bedside. 

## 2013-07-27 NOTE — Progress Notes (Signed)
ANTICOAGULATION CONSULT NOTE - Initial Consult  Pharmacy Consult for Warfarin Indication: CVA  No Known Allergies Patient Measurements: Weight 87.1 kg on 07/24/13 Height 175cm on 07/24/13 Vital Signs: Temp: 97.9 F (36.6 C) (12/12 1225) BP: 168/103 mmHg (12/12 1345) Pulse Rate: 107 (12/12 1345) Labs:  Recent Labs  07/27/13 1105 07/27/13 1107  HGB 13.6  --   HCT 39.0  --   PLT 266  --   LABPROT 19.0*  --   INR 1.64*  --   CREATININE 0.77  --   TROPONINI  --  <0.30   The CrCl is unknown because both a height and weight (above a minimum accepted value) are required for this calculation.  Medical History: Past Medical History  Diagnosis Date  . Hypertension   . Seizures     partial and generalized, poor compliance  . Myocardial infarction   . Unspecified hereditary and idiopathic peripheral neuropathy   . Vitamin B12 deficiency   . High blood pressure   . Ataxia   . Thoracic or lumbosacral neuritis or radiculitis, unspecified   . Coronary atherosclerosis of artery bypass graft   . Memory loss   . Generalized convulsive epilepsy without mention of intractable epilepsy   . Obstructive sleep apnea on CPAP   . Weakness of left side of body     S/P CVA  . Stroke     Multiple  . Stroke     received TPA 07/18/2013  . Atrial fibrillation   . Dementia with behavioral disturbance   . Dysphagia    Medications:  Coumadin 5 mg po every evening per [facility]. Pt not able to answer questions but family agrees that since pt has been here since this morning he has not had an evening dose of coumadin.  Assessment: 67 YOM on coumadin for recent CVA  and atrial fibrillation in ED with AMS. Coumadin to continue per pharmacy dosing.   CT shows acute L parietal infarct with resolving R parietal infarct.  Plan per Dr. Johnna Acosta progress note is to continue Coumadin as tolerated per Neurology recommendations.   INR today is 1.64. Patient's last dose of Coumadin is presumed to be  yesterday.  Hg 13.6, pltc 266.    I am concerned about his ability to take PO meds based on my quick visit with him but he did just get a dose of ativan.  RN to do stroke swallow screen prior to giving coumadin.   Goal of Therapy:  INR 2-3 Monitor platelets by anticoagulation protocol: Yes   Plan:  1. Start lovenox 40 mg sq 24 until INR > = 2 2. Give 7.5 mg po coumadin today 3. Daily INR 4. F/u on swallow screen and ability to take po Herby Abraham, Pharm.D. [phone] 07/27/2013 1:55 PM

## 2013-07-27 NOTE — ED Notes (Signed)
Myself and Lanora Manis, RN changed pt and stretcher linens
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
## 2013-07-27 NOTE — Progress Notes (Signed)
SLP Cancellation Note  Patient Details Name: [name] MRN: [id number] DOB: [birth year]   Cancelled evaluation:        Pt recently arrived from ED, currently sleeping and unable to arouse to complete evaluation at this time. Family requested therapist to return tomorrow to complete evaluation. Will f/u tomorrow AM for further evaluation.    Chyrel Masson MA CCC-SLP 07/27/2013, 4:07 PM

## 2013-07-28 LAB — COMPREHENSIVE METABOLIC PANEL
ALT: 15 U/L (ref 0–53)
Albumin: 3 g/dL — ABNORMAL LOW (ref 3.5–5.2)
Alkaline Phosphatase: 82 U/L (ref 39–117)
BUN: 10 mg/dL (ref 6–23)
Calcium: 8.6 mg/dL (ref 8.4–10.5)
GFR calc Af Amer: >90 mL/min (ref >90)
GFR calc non Af Amer: >90 mL/min (ref >90)
Glucose, Bld: 96 mg/dL (ref 70–99)
Potassium: 4.2 mEq/L (ref 3.5–5.1)
Sodium: 136 mEq/L (ref 135–145)
Total Protein: 7.1 g/dL (ref 6.0–8.3)

## 2013-07-28 LAB — CBC
HCT: 35.8 % — ABNORMAL LOW (ref 39.0–52.0)
Hemoglobin: 12.2 g/dL — ABNORMAL LOW (ref 13.0–17.0)
MCHC: 34.1 g/dL (ref 30.0–36.0)
MCV: 92.5 fL (ref 78.0–100.0)
Platelets: 268 10*3/uL (ref 150–400)
RBC: 3.87 MIL/uL — ABNORMAL LOW (ref 4.22–5.81)
RDW: 14.4 % (ref 11.5–15.5)
WBC: 10.8 10*3/uL — ABNORMAL HIGH (ref 4.0–10.5)

## 2013-07-28 LAB — PROTIME-INR
INR: 2.14 — ABNORMAL HIGH (ref 0.00–1.49)
Prothrombin Time: 23.2 seconds — ABNORMAL HIGH (ref 11.6–15.2)

## 2013-07-28 LAB — GLUCOSE, CAPILLARY: Glucose-Capillary: 113 mg/dL — ABNORMAL HIGH (ref 70–99)

## 2013-07-28 MED ORDER — PHENOBARBITAL 32.4 MG PO TABS
32.4000 mg | ORAL_TABLET | Freq: Two times a day (BID) | ORAL | Status: DC
  Administered 2013-07-28 – 2013-07-30 (×3): 32.4 mg via ORAL
  Filled 2013-07-28 (×3): qty 1

## 2013-07-28 MED ORDER — WARFARIN SODIUM 5 MG PO TABS
5.0000 mg | ORAL_TABLET | Freq: Once | ORAL | Status: AC
  Administered 2013-07-28: 5 mg via ORAL
  Filled 2013-07-28: qty 1

## 2013-07-28 NOTE — Progress Notes (Signed)
[facility] PROGRESS NOTE  [name] ZOX:[id number] DOB: 1946-03-03 DOA: 07/27/2013 PCP: Oneal Grout, MD  Assessment/Plan: 1. Acute CVA  1. Neurology consulted and are following 2. MRI results reviewed - multiple acute CVA bilaterally concerning for embolic source? 3. Will cont coumadin as tolerated per Neurology recs 4. PT/OT/SLP 2. Hx seizures  1. Cont IV forms of current seizure meds until pt can reliably tolerate PO 3. HTN  1. Stable, will allow permissive HTN for now 2. Cont IV beta blocker until able to reliably tolerate PO meds 4. HLD  1. Resume statin when able to tolerate PO 5. Dementia  1. Stable 2. Would resume meds when cleared for PO 6. Afib  1. Currently rate controlled 2. Cont monitor 3. Cont beta blocker 7. DVT prophylaxis  1. Coumadin per above  Code Status: DNR Family Communication: Pt and wife in room (indicate person spoken with, relationship, and if by phone, the number) Disposition Plan: Pending - poss return to snf?   Consultants:  Neurology  HPI/Subjective: No acute events noted overnight  Objective: Filed Vitals:   07/27/13 2134 07/28/13 0155 07/28/13 0500 07/28/13 0950  BP: 137/100 130/97 167/97 143/97  Pulse: 63 71 98 88  Temp:  98.2 F (36.8 C) 97.5 F (36.4 C) 97.6 F (36.4 C)  TempSrc:  Oral Axillary Axillary  Resp: 24 20 22 22   SpO2: 100% 97% 97% 100%    Intake/Output Summary (Last 24 hours) at 07/28/13 1041 Last data filed at 07/28/13 0330  Gross per 24 hour  Intake      0 ml  Output   1450 ml  Net  -1450 ml   There were no vitals filed for this visit.  Exam:  General:  Awake, in nad  Cardiovascular: regular, s1, s2  Respiratory: normal resp effort, no wheezing  Abdomen: soft, nondistended  Musculoskeletal: perfused, no clubbing   Data Reviewed: Basic Metabolic Panel:  Recent Labs Lab 07/27/13 1105 07/28/13 0335  NA 137 136  K 3.6 4.2  CL 100 101  CO2 22 24  GLUCOSE 121* 96  BUN 9 10   CREATININE 0.77 0.73  CALCIUM 9.5 8.6   Liver Function Tests:  Recent Labs Lab 07/27/13 1105 07/28/13 0335  AST 24 31  ALT 19 15  ALKPHOS 103 82  BILITOT 0.5 0.4  PROT 8.4* 7.1  ALBUMIN 3.6 3.0*   No results found for this basename: LIPASE, AMYLASE,  in the last 168 hours No results found for this basename: AMMONIA,  in the last 168 hours CBC:  Recent Labs Lab 07/27/13 1105 07/28/13 0335  WBC 12.1* 10.8*  NEUTROABS 10.3*  --   HGB 13.6 12.2*  HCT 39.0 35.8*  MCV 92.2 92.5  PLT 266 268   Cardiac Enzymes:  Recent Labs Lab 07/27/13 1107  TROPONINI <0.30   BNP (last 3 results) No results found for this basename: PROBNP,  in the last 8760 hours CBG:  Recent Labs Lab 07/27/13 1626  GLUCAP 101*    Recent Results (from the past 240 hour(s))  MRSA PCR SCREENING     Status: None   Collection Time    07/18/13  3:53 PM      Result Value Range Status   MRSA by PCR NEGATIVE  NEGATIVE Final   Comment:            The GeneXpert MRSA Assay (FDA     approved for NASAL specimens     only), is one component of a  comprehensive MRSA colonization     surveillance program. It is not     intended to diagnose MRSA     infection nor to guide or     monitor treatment for     MRSA infections.     Studies: Ct Head Wo Contrast  07/27/2013   CLINICAL DATA:  Decreased level of consciousness. Stroke 1 week ago. Increasing confusion.  EXAM: CT HEAD WITHOUT CONTRAST  TECHNIQUE: Contiguous axial images were obtained from the base of the skull through the vertex without intravenous contrast.  COMPARISON:  Brain MRI 07/19/2013 and head CT 07/18/2012  FINDINGS: Hypoattenuation with loss of gray-white differentiation in the medial right temporal and right occipital lobes corresponds to acute PCA infarct described on recent MRI. A small amount of hyperattenuation within the region of infarct may reflect a small amount of petechial hemorrhage or preserved gray matter. There is minimal  mass effect on the occipital horn of the right lateral ventricle. There is no midline shift.  There is new hypoattenuation measuring approximately 3 x 3 cm involving the left parietal lobe consistent with new infarct (series 2, image 24). There is moderate generalized cerebral atrophy, advanced for age. Periventricular white matter hypodensities are similar to the prior exam and consistent with advanced chronic small vessel ischemic disease. There is no extra-axial fluid collection. Orbits are unremarkable. Visualized paranasal sinuses and mastoid air cells are clear.  IMPRESSION: 1. New, acute left parietal infarct. 2. Evolving right PCA infarct. These results were called by telephone at the time of interpretation on 07/27/2013 at 11:03 AM to Dr. Samuel Jester , who verbally acknowledged these results.   Electronically Signed   By: Sebastian Ache   On: 07/27/2013 11:05   Mr Brain Wo Contrast  07/27/2013   CLINICAL DATA:  Recent right hemispheric infarct.  Seizures.  EXAM: MRI HEAD WITHOUT CONTRAST  TECHNIQUE: Multiplanar, multiecho pulse sequences of the brain and surrounding structures were obtained without intravenous contrast.  COMPARISON:  07/27/2013 CT.  07/19/2013 and 06/25/2013 MR.  FINDINGS: Present examination is limited to 4 motion degraded sequences. Patient was not able to complete remainder of examination.  Subacute large partially hemorrhagic infarct medial right temporal lobe, right occipital lobe and right thalamus.  New moderate size infarct centered at the right periatrial region involving posterior right temporal lobe, right occipital lobe extending toward junction with the right parietal lobe.  New moderate size left periatrial infarct centered at the left parietal lobe.  New small lateral posterior left thalamic infarct.  New medial left temporal lobe -occipital lobe infarct.  Although it is possible that seizure activity could contribute to the abnormality involving the medial left  temporal lobe, remainder of findings are more suggestive of result of acute infarction rather than result of seizure activity.  Global atrophy.  Ventricular prominence without change.  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  Although the major intracranial vascular structures appear patent at the level of the sella, branch vessel analysis is limited.  IMPRESSION: Several new infarcts noted as detailed above.  These results were called by telephone at the time of interpretation on 07/27/2013 at 2:58 PM to Dr. Samuel Jester , who verbally acknowledged these results.   Electronically Signed   By: Bridgett Larsson M.D.   On: 07/27/2013 15:01   Dg Chest Portable 1 View  07/27/2013   CLINICAL DATA:  Acute mental status changes, hypertension  EXAM: PORTABLE CHEST - 1 VIEW  COMPARISON:  07/18/2013  FINDINGS:  Low lung volumes. An area of increased density projects within the left lung base with blunting of the costophrenic angle. Cardiac silhouette is enlarged. Patient is status post median sternotomy and coronary artery bypass grafting. The osseous structures are unremarkable.  IMPRESSION: Atelectasis versus infiltrate left lung base likely component of a small effusion.   Electronically Signed   By: Salome Holmes M.D.   On: 07/27/2013 11:44    Scheduled Meds: . enoxaparin (LOVENOX) injection  40 mg Subcutaneous Q24H  . levETIRAcetam  500 mg Intravenous Q12H  . metoprolol  5 mg Intravenous Q6H  . PHENObarbital  65 mg Intravenous BID  . sodium chloride  3 mL Intravenous Q12H  . valproate sodium  250 mg Intravenous Q12H  . warfarin  7.5 mg Oral ONCE-1800  . Warfarin - Pharmacist Dosing Inpatient   Does not apply q1800   Continuous Infusions: . 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 If 7PM-7AM, please contact night-coverage at [email or web address], password [email or web address] 07/28/2013, 10:41 AM  LOS: 1 day

## 2013-07-28 NOTE — Evaluation (Signed)
Physical Therapy Evaluation Patient Details Name: [name] MRN: [id number] DOB: 06/19/1946 Today's Date: 07/28/2013 Time: 8295-6213 PT Time Calculation (min): 16 min  PT Assessment / Plan / Recommendation History of Present Illness  Found to have acute infarcts to L temporal, occipital and thalamic areas. [name] from last week.  Clinical Impression  Pt presents with both cognitive and motor deficits. Pt suspected to have R sided inattention as well. Pt with R sided weakness in addition to residual L sided weakness from stroke last week. Pt remains appropriate for SNF upon d/c. Wife in agreeable for pt to return to [facility].    PT Assessment  Patient needs continued PT services    Follow Up Recommendations  SNF    Does the patient have the potential to tolerate intense rehabilitation      Barriers to Discharge        Equipment Recommendations  None recommended by PT    Recommendations for Other Services     Frequency Min 3X/week    Precautions / Restrictions Precautions Precautions: Fall Precaution Comments: suspect pt to have R sided neglect Restrictions Weight Bearing Restrictions: No   Pertinent Vitals/Pain Reports of headache      Mobility  Bed Mobility Bed Mobility: Supine to Sit;Sitting - Scoot to [place] of Bed;Sit to Supine Supine to Sit: 1: +2 Total assist;HOB elevated Supine to Sit: Patient Percentage: 10% Sitting - Scoot to Edge of Bed: 1: +2 Total assist Sit to Supine: 1: +2 Total assist;HOB flat Sit to Supine: Patient Percentage: 10% Scooting to HOB: 1: +2 Total assist Scooting to [facility]: Patient Percentage: 0% Details for Bed Mobility Assistance: pt initiated bringing trunk up once LEs were moved off edge of bed however required totalAx2 to complete transfer to EOB as pt with strong retrolpulsion Transfers Transfers: Not assessed (pt not appropriate) Ambulation/Gait Ambulation/Gait Assistance: Not tested  (comment) Modified Rankin (Stroke Patients Only) Pre-Morbid Rankin Score: Moderate disability Modified Rankin: Severe disability    Exercises     PT Diagnosis: Difficulty walking;Generalized weakness  PT Problem List: Decreased strength;Decreased activity tolerance;Decreased balance;Decreased mobility;Decreased coordination;Decreased cognition;Decreased knowledge of use of DME;Decreased safety awareness PT Treatment Interventions: DME instruction;Gait training;Stair training;Functional mobility training;Therapeutic activities;Therapeutic exercise;Balance training;Neuromuscular re-education;Cognitive remediation;Patient/family education     PT Goals(Current goals can be found in the care plan section) Acute Rehab PT Goals Patient Stated Goal: None stated.   PT Goal Formulation: With family Time For Goal Achievement: 08/11/13 Potential to Achieve Goals: Fair  Visit Information  Last PT Received On: 07/28/13 Assistance Needed: +2 PT/OT/SLP Co-Evaluation/Treatment: Yes Reason for Co-Treatment: Complexity of the patient's impairments (multi-system involvement) PT goals addressed during session: Balance;Mobility/safety with mobility History of Present Illness: Found to have acute infarcts to L temporal, occipital and thalamic areas. [name] from last week.       Prior Functioning  Home Living Family/patient expects to be discharged to:: Skilled nursing facility Living Arrangements: Spouse/significant other Type of Home: Skilled Nursing Facility Additional Comments: Pt came from [facility] where has been since last admission on 06/20/13.  Prior to last admission pt was home with wife however frequently falling weekly.  Prior Function Level of Independence: Needs assistance Gait / Transfers Assistance Needed: pt was non-ambulatory at [facility] but working with PT ADL's / Homemaking Assistance Needed: assist Communication Communication: Expressive  difficulties Dominant Hand: Right    Cognition  Cognition Arousal/Alertness: Lethargic Behavior During Therapy: Restless Overall Cognitive Status: Impaired/Different from  baseline Area of Impairment: Orientation;Attention;Following commands;Safety/judgement;Awareness;Problem solving Orientation Level: Disoriented to;Place;Time;Situation Current Attention Level: Focused Memory: Decreased short-term memory Following Commands: Follows one step commands inconsistently Safety/Judgement: Decreased awareness of safety Awareness: Intellectual Problem Solving: Slow processing;Requires verbal cues;Requires tactile cues;Difficulty sequencing    Extremity/Trunk Assessment Upper Extremity Assessment Upper Extremity Assessment: Difficult to assess due to impaired cognition;Defer to OT evaluation Lower Extremity Assessment Lower Extremity Assessment: RLE deficits/detail;LLE deficits/detail;Difficult to assess due to impaired cognition RLE Deficits / Details: pt unable to complete LAQ when compared to L LE, did initiate hip flex but unable to move through avail range LLE Deficits / Details: able to complete LAQ but unable to formally MMT due to cognition Cervical / Trunk Assessment Cervical / Trunk Assessment: Normal   Balance Balance Balance Assessed: Yes Static Sitting Balance Static Sitting - Balance Support: Feet supported;Feet unsupported Static Sitting - Level of Assistance: 2: Max assist Static Sitting - Comment/# of Minutes: significant retropulsion, pt with improved balance only requiring minA when pt asked to complete dynamic reaching  End of Session PT - End of Session Equipment Utilized During Treatment: Gait belt Activity Tolerance: Patient tolerated treatment well;Patient limited by lethargy Patient left: in bed;with bed alarm set;with call bell/phone within reach Nurse Communication: Mobility status  GP     [name] 07/28/2013, 9:56 AM  [name], PT,  DPT Pager #: [phone] Office #: [phone]
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
## 2013-07-28 NOTE — Evaluation (Signed)
Occupational Therapy Evaluation Patient Details Name: [name] MRN: [id number] DOB: 13-Jun-1946 Today's Date: 07/28/2013 Time: 4098-1191 OT Time Calculation (min): 16 min  OT Assessment / Plan / Recommendation History of present illness Found to have acute infarcts to L temporal, occipital and thalamic areas. [name] from last week.   Clinical Impression   Pt admitted with above. Suspect right side deficits and weakness along with residual left sided weakness from stroke last week.  Limited participation in eval session due to cognitive deficits and pt very lethargic.  Recommend return to SNF for d/c planning.    OT Assessment  Patient needs continued OT Services    Follow Up Recommendations  SNF    Barriers to Discharge      Equipment Recommendations  None recommended by OT    Recommendations for Other Services    Frequency  Min 2X/week    Precautions / Restrictions Precautions Precautions: Fall Precaution Comments: suspect pt to have R sided neglect Restrictions Weight Bearing Restrictions: No   Pertinent Vitals/Pain See vitals    ADL  Grooming: Wash/dry hands;+1 Total assistance Where Assessed - Grooming: Supported sitting ADL Comments: Pt performed sitting balance EOB.  Very restless and limited participation with ADLs.     OT Diagnosis: Generalized weakness;Cognitive deficits;Disturbance of vision;Hemiplegia non-dominant side  OT Problem List: Decreased strength;Decreased activity tolerance;Impaired balance (sitting and/or standing);Impaired vision/perception;Decreased cognition;Impaired UE functional use;Decreased safety awareness OT Treatment Interventions: Self-care/ADL training;Therapeutic exercise;Neuromuscular education;DME and/or AE instruction;Therapeutic activities;Cognitive remediation/compensation;Visual/perceptual remediation/compensation;Patient/family education;Balance training   OT Goals(Current goals can be found in the  care plan section) Acute Rehab OT Goals Patient Stated Goal: None stated.   OT Goal Formulation: With family Time For Goal Achievement: 08/11/13 Potential to Achieve Goals: Good  Visit Information  Last OT Received On: 07/28/13 Assistance Needed: +2 PT/OT/SLP Co-Evaluation/Treatment: Yes Reason for Co-Treatment: Complexity of the patient's impairments (multi-system involvement) PT goals addressed during session: Balance;Mobility/safety with mobility OT goals addressed during session: ADL's and self-care (balance to participate in ADLs) History of Present Illness: Found to have acute infarcts to L temporal, occipital and thalamic areas. [name] from last week.       Prior Functioning     Home Living Family/patient expects to be discharged to:: Skilled nursing facility Living Arrangements: Spouse/significant other Type of Home: Skilled Nursing Facility Additional Comments: Pt came from [facility] where has been since last admission on 06/20/13.  Prior to last admission pt was home with wife however frequently falling weekly.  Prior Function Level of Independence: Needs assistance Gait / Transfers Assistance Needed: pt was non-ambulatory at [facility] but working with PT ADL's / Homemaking Assistance Needed: assist Communication Communication: Expressive difficulties Dominant Hand: Right         Vision/Perception Vision - Assessment Additional Comments: Pt unable to participate in formal vision assessment.  Keeping eyes closed majority of session.  Wife reports that both his eyes have been affected.  Noted on 07/20/13 of previous admission, pt presented with left homonymous hemianopsia.   Cognition  Cognition Arousal/Alertness: Lethargic Behavior During Therapy: Restless Overall Cognitive Status: Impaired/Different from baseline Area of Impairment: Orientation;Attention;Following commands;Safety/judgement;Awareness;Problem solving Orientation  Level: Disoriented to;Place;Time;Situation Current Attention Level: Focused Memory: Decreased short-term memory Following Commands: Follows one step commands inconsistently Safety/Judgement: Decreased awareness of safety Awareness: Intellectual Problem Solving: Slow processing;Requires verbal cues;Requires tactile cues;Difficulty sequencing    Extremity/Trunk Assessment Upper Extremity Assessment Upper Extremity Assessment: RUE deficits/detail;LUE deficits/detail;Difficult to assess due to impaired  cognition RUE Deficits / Details: at least 2/5 shoulder and 3/5 elbow to hand per observation sitting EOB LUE Deficits / Details: at least 2/5 shoulder and 3/5 elbow to hand per observation sitting EOB Lower Extremity Assessment Lower Extremity Assessment: RLE deficits/detail;LLE deficits/detail;Difficult to assess due to impaired cognition RLE Deficits / Details: pt unable to complete LAQ when compared to L LE, did initiate hip flex but unable to move through avail range LLE Deficits / Details: able to complete LAQ but unable to formally MMT due to cognition Cervical / Trunk Assessment Cervical / Trunk Assessment: Normal     Mobility Bed Mobility Bed Mobility: Supine to Sit;Sitting - Scoot to [place] of Bed;Sit to Supine Supine to Sit: 1: +2 Total assist;HOB elevated Supine to Sit: Patient Percentage: 10% Sitting - Scoot to Edge of Bed: 1: +2 Total assist Sit to Supine: 1: +2 Total assist;HOB flat Sit to Supine: Patient Percentage: 10% Scooting to HOB: 1: +2 Total assist Scooting to [facility]: Patient Percentage: 0% Details for Bed Mobility Assistance: pt initiated bringing trunk up once LEs were moved off edge of bed however required totalAx2 to complete transfer to EOB as pt with strong retrolpulsion     Exercise     Balance Balance Balance Assessed: Yes Static Sitting Balance Static Sitting - Balance Support: Feet supported;Feet unsupported Static Sitting - Level of Assistance: 2: Max  assist Static Sitting - Comment/# of Minutes: significant retropulsion, pt with improved balance only requiring minA when pt asked to complete dynamic reaching   End of Session OT - End of Session Activity Tolerance: Patient tolerated treatment well Patient left: in bed;with call bell/phone within reach;with nursing/sitter in room Nurse Communication: Mobility status;Need for lift equipment  GO    07/28/2013 Cipriano Mile OTR/L Pager [phone] Office [phone]  Cipriano Mile 07/28/2013, 11:44 AM

## 2013-07-28 NOTE — Progress Notes (Signed)
ANTICOAGULATION CONSULT NOTE - Follow-Up Consult  Pharmacy Consult for Warfarin Indication: CVA  No Known Allergies Patient Measurements: Weight 87.1 kg on 07/24/13 Height 175cm on 07/24/13 Vital Signs: Temp: 97.8 F (36.6 C) (12/13 1444) Temp src: Oral (12/13 1444) BP: 138/92 mmHg (12/13 1444) Pulse Rate: 84 (12/13 1444) Labs:  Recent Labs  07/27/13 1105 07/27/13 1107 07/28/13 0335  HGB 13.6  --  12.2*  HCT 39.0  --  35.8*  PLT 266  --  268  LABPROT 19.0*  --  23.2*  INR 1.64*  --  2.14*  CREATININE 0.77  --  0.73  TROPONINI  --  <0.30  --    The CrCl is unknown because both a height and weight (above a minimum accepted value) are required for this calculation.  Medical History: Past Medical History  Diagnosis Date  . Hypertension   . Seizures     partial and generalized, poor compliance  . Myocardial infarction   . Unspecified hereditary and idiopathic peripheral neuropathy   . Vitamin B12 deficiency   . High blood pressure   . Ataxia   . Thoracic or lumbosacral neuritis or radiculitis, unspecified   . Coronary atherosclerosis of artery bypass graft   . Memory loss   . Generalized convulsive epilepsy without mention of intractable epilepsy   . Obstructive sleep apnea on CPAP   . Weakness of left side of body     S/P CVA  . Stroke     Multiple  . Stroke     received TPA 07/18/2013  . Atrial fibrillation   . Dementia with behavioral disturbance   . Dysphagia    Medications:  Coumadin 5 mg po every evening per [facility]. Pt not able to answer questions but family agrees that since pt has been here since this morning he has not had an evening dose of coumadin.  Assessment: [name] on coumadin for recent CVA  and atrial fibrillation in ED with AMS. Coumadin to continue per pharmacy dosing.   CT shows acute L parietal infarct with resolving R parietal infarct.  Plan per Dr. Johnna Acosta progress note is to continue Coumadin as tolerated per Neurology  recommendations.   INR today is therapeutic.  Of note, he did not receive dose last night due to lethargy.  Confirmed with today's nurse that he is able to take po now that he's more awake.  Goal of Therapy:  INR 2-3 Monitor platelets by anticoagulation protocol: Yes   Plan:  1. D/C Lovenox. 2. Coumadin 5 mg po x 1 tonight. 3. Continue daily PT/INR.  Tad Moore, BCPS  Clinical Pharmacist Pager [phone]  07/28/2013 4:10 PM
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
## 2013-07-28 NOTE — Progress Notes (Signed)
Stroke Team Progress Note  HISTORY [name] is a 67 y.o. male history of atrial fibrillation on Coumadin, acute stroke on 07/18/2013 treated with TPA, hypertension, hyperlipidemia and dementia who was noted to be more confused and complaining of total loss of vision acutely. Patient was discharged to a skilled nursing facility following his recent admission. Stroke on 07/18/2013 involved his right occipital region and patient had left visual loss and left-sided weakness. CT scan of his head 07/27/2013 showed an area of acute infarction involving his left parietal region, in addition to evolving changes in his recent stroke involving the right occipital region with possible petechial hemorrhaging. INR was 1.64 on admission.  LSN: 07/26/2013, evening, unclear time.  tPA Given: No: TPA given on 07/18/2013.  MRankin: 4  SUBJECTIVE The patient's wife and daughter at the bedside. The had multiple questions all which were answered. The patient is unable to communicate at this time.  OBJECTIVE Most recent Vital Signs: Filed Vitals:   07/27/13 2134 07/28/13 0155 07/28/13 0500 07/28/13 0950  BP: 137/100 130/97 167/97 143/97  Pulse: 63 71 98 88  Temp:  98.2 F (36.8 C) 97.5 F (36.4 C) 97.6 F (36.4 C)  TempSrc:  Oral Axillary Axillary  Resp: 24 20 22 22   SpO2: 100% 97% 97% 100%   CBG (last 3)   Recent Labs  07/27/13 1626 07/28/13 1149  GLUCAP 101* 113*    IV Fluid Intake:   . sodium chloride 75 mL/hr at 07/27/13 1640    MEDICATIONS  . enoxaparin (LOVENOX) injection  40 mg Subcutaneous Q24H  . levETIRAcetam  500 mg Intravenous Q12H  . metoprolol  5 mg Intravenous Q6H  . PHENObarbital  65 mg Intravenous BID  . sodium chloride  3 mL Intravenous Q12H  . valproate sodium  250 mg Intravenous Q12H  . warfarin  7.5 mg Oral ONCE-1800  . Warfarin - Pharmacist Dosing Inpatient   Does not apply q1800   PRN:  acetaminophen, acetaminophen, morphine injection  Diet:  Dysphagia 2 with  nectar thick liquids Activity:  Up with assistance DVT Prophylaxis:  Warfarin  CLINICALLY SIGNIFICANT STUDIES Basic Metabolic Panel:   Recent Labs Lab 07/27/13 1105 07/28/13 0335  NA 137 136  K 3.6 4.2  CL 100 101  CO2 22 24  GLUCOSE 121* 96  BUN 9 10  CREATININE 0.77 0.73  CALCIUM 9.5 8.6   Liver Function Tests:   Recent Labs Lab 07/27/13 1105 07/28/13 0335  AST 24 31  ALT 19 15  ALKPHOS 103 82  BILITOT 0.5 0.4  PROT 8.4* 7.1  ALBUMIN 3.6 3.0*   CBC:   Recent Labs Lab 07/27/13 1105 07/28/13 0335  WBC 12.1* 10.8*  NEUTROABS 10.3*  --   HGB 13.6 12.2*  HCT 39.0 35.8*  MCV 92.2 92.5  PLT 266 268   Coagulation:   Recent Labs Lab 07/27/13 1105 07/28/13 0335  LABPROT 19.0* 23.2*  INR 1.64* 2.14*   Cardiac Enzymes:   Recent Labs Lab 07/27/13 1107  TROPONINI <0.30   Urinalysis:   Recent Labs Lab 07/27/13 1348  COLORURINE YELLOW  LABSPEC 1.013  PHURINE 7.5  GLUCOSEU NEGATIVE  HGBUR TRACE*  BILIRUBINUR NEGATIVE  KETONESUR 40*  PROTEINUR 30*  UROBILINOGEN 1.0  NITRITE NEGATIVE  LEUKOCYTESUR NEGATIVE   Lipid Panel    Component Value Date/Time   CHOL 137 07/19/2013 0353   TRIG 79 07/19/2013 0353   HDL 54 07/19/2013 0353   CHOLHDL 2.5 07/19/2013 0353   VLDL 16 07/19/2013 0353  LDLCALC 67 07/19/2013 0353   HgbA1C  Lab Results  Component Value Date   HGBA1C 5.9* 07/19/2013    Urine Drug Screen:     Component Value Date/Time   LABOPIA NONE DETECTED 06/25/2013 1400   COCAINSCRNUR NONE DETECTED 06/25/2013 1400   LABBENZ NONE DETECTED 06/25/2013 1400   AMPHETMU NONE DETECTED 06/25/2013 1400   THCU NONE DETECTED 06/25/2013 1400   LABBARB POSITIVE* 06/25/2013 1400    Alcohol Level: No results found for this basename: ETH,  in the last 168 hours  Ct Head Wo Contrast 07/27/2013     1. New, acute left parietal infarct. 2. Evolving right PCA infarct.    Mr Brain Wo Contrast 07/27/2013    Subacute large partially hemorrhagic infarct  medial right temporal lobe right occipital lobe and right thalamus.  New moderate size infarct centered at the right periatrial region involving posterior right temporal lobe, right occipital lobe  extending toward junction with the right parietal lobe.  New moderate size left periatrial infarct centered at the left parietal lobe.  New small lateral posterior left thalamic infarct.  New medial left temporal lobe -occipital lobe infarct. Impression: Several new infarcts noted as detailed above.       Dg Chest Portable 1 View 07/27/2013    Atelectasis versus infiltrate left lung base likely component of a small effusion.        MRA of the brain    2D Echocardiogram  ejection fraction 50-55%. No cardiac source of emboli was identified  Carotid Doppler  The vertebral arteries appear patent with antegrade flow. Findings consistent with 1-39 percent stenosis involving the right internal carotid artery and the left internal carotid artery.    EKG   atrial fibrillation ventricular response 98 beats per minute  Therapy Recommendations - skilled nursing facility recommended  Physical Exam  Neurologic Examination:  Patient was somnolent and difficult to arouse. The patient did not attempt to speak or follow any commands. Pupils were unequal, left greater than right. Left pupil reacted to light. Reaction of right pupil to light was equivocal.  Extraocular movements could not be tested Patient was not alert enough to assess visual fields.  No facial weakness was noted.  Motor exam - the patient withdraws to pain on the right but not on the left.  Muscle tone was flaccid throughout.  Deep tendon reflexes were 2+ and symmetrical.  Plantar responses on the left was extensor on the right mute.   ASSESSMENT [name] is a 67 y.o. male presenting with confusion and visual disturbance. TPA was not given as the patient had a previous acute stroke on 07/18/2013 and received TPA at that time.  The patient has also been on Coumadin for atrial fibrillation although his admission INR was subtherapeutic at 1.64. Imaging confirms multiple infarcts as noted above which are felt to be embolic secondary to atrial fibrillation. On warfarin prior to admission. Now on warfarin for secondary stroke prevention. Patient with resultant altered mental status and left hemiparesis. Work up underway.   Atrial fibrillation  Previous strokes - recently treated with TPA on 07/18/2013  Seizure disorder  Hypertension history  Family history of stroke  Coronary artery disease  History of dementia  Hyperlipidemia history - cholesterol 137 LDL 67 this admission  Hospital day # 1  TREATMENT/PLAN  Continue warfarin for secondary stroke prevention.  Skilled nursing facility recommended by the therapists (the patient had been at [facility] prior to admission )  Not a candidate for newer  anticoagulants secondary to medication interactions.  Delton See PA-C [facility] Pager [phone] 07/28/2013, 12:41 PM  I evaluated and examined patient, reviewed records, labs and imaging, and agree with note and plan. Continue coumadin. Discussed with family.   Suanne Marker, MD 07/28/2013, 3:26 PM Certified in Neurology, Neurophysiology and Neuroimaging [facility] - Stroke Team  Please refer to [email or web address] for on-call Stroke MD

## 2013-07-29 LAB — PROTIME-INR
INR: 2.83 — ABNORMAL HIGH (ref 0.00–1.49)
Prothrombin Time: 28.8 seconds — ABNORMAL HIGH (ref 11.6–15.2)

## 2013-07-29 MED ORDER — QUETIAPINE FUMARATE 25 MG PO TABS
25.0000 mg | ORAL_TABLET | Freq: Every day | ORAL | Status: DC
  Administered 2013-07-29: 25 mg via ORAL
  Filled 2013-07-29 (×2): qty 1

## 2013-07-29 MED ORDER — LEVETIRACETAM 500 MG PO TABS
500.0000 mg | ORAL_TABLET | Freq: Two times a day (BID) | ORAL | Status: DC
Start: 2013-07-29 — End: 2013-07-30
  Administered 2013-07-29 – 2013-07-30 (×3): 500 mg via ORAL
  Filled 2013-07-29 (×4): qty 1

## 2013-07-29 MED ORDER — WARFARIN SODIUM 3 MG PO TABS
3.0000 mg | ORAL_TABLET | Freq: Once | ORAL | Status: AC
  Administered 2013-07-29: 3 mg via ORAL
  Filled 2013-07-29: qty 1

## 2013-07-29 MED ORDER — CARVEDILOL 3.125 MG PO TABS
3.1250 mg | ORAL_TABLET | Freq: Two times a day (BID) | ORAL | Status: DC
  Administered 2013-07-29 – 2013-07-30 (×4): 3.125 mg via ORAL
  Filled 2013-07-29 (×5): qty 1

## 2013-07-29 MED ORDER — GALANTAMINE HYDROBROMIDE ER 16 MG PO CP24
16.0000 mg | ORAL_CAPSULE | Freq: Every day | ORAL | Status: DC
  Filled 2013-07-29 (×2): qty 1

## 2013-07-29 MED ORDER — MEMANTINE HCL ER 28 MG PO CP24
28.0000 mg | ORAL_CAPSULE | Freq: Every day | ORAL | Status: DC
  Administered 2013-07-29 – 2013-07-30 (×2): 28 mg via ORAL
  Filled 2013-07-29 (×3): qty 28

## 2013-07-29 MED ORDER — ESCITALOPRAM OXALATE 20 MG PO TABS
20.0000 mg | ORAL_TABLET | Freq: Every day | ORAL | Status: DC
  Administered 2013-07-29 – 2013-07-30 (×2): 20 mg via ORAL
  Filled 2013-07-29 (×2): qty 1

## 2013-07-29 MED ORDER — NIFEDIPINE ER 60 MG PO TB24
60.0000 mg | ORAL_TABLET | Freq: Every day | ORAL | Status: DC
  Administered 2013-07-29 – 2013-07-30 (×2): 60 mg via ORAL
  Filled 2013-07-29 (×2): qty 1

## 2013-07-29 MED ORDER — FOLIC ACID 1 MG PO TABS
1.0000 mg | ORAL_TABLET | Freq: Every morning | ORAL | Status: DC
  Administered 2013-07-29 – 2013-07-30 (×2): 1 mg via ORAL
  Filled 2013-07-29 (×2): qty 1

## 2013-07-29 MED ORDER — DIVALPROEX SODIUM ER 250 MG PO TB24
250.0000 mg | ORAL_TABLET | Freq: Every day | ORAL | Status: DC
  Administered 2013-07-29 – 2013-07-30 (×2): 250 mg via ORAL
  Filled 2013-07-29 (×2): qty 1

## 2013-07-29 MED ORDER — ATORVASTATIN CALCIUM 40 MG PO TABS
40.0000 mg | ORAL_TABLET | Freq: Every day | ORAL | Status: DC
  Administered 2013-07-29: 40 mg via ORAL
  Filled 2013-07-29 (×2): qty 1

## 2013-07-29 NOTE — Progress Notes (Signed)
[facility] PROGRESS NOTE  Ines Warf XBJ:[id number] DOB: 1946/03/06 DOA: 07/27/2013 PCP: Oneal Grout, MD  Assessment/Plan: 1. Acute CVA  1. Neurology consulted and are following 2. MRI results reviewed - multiple acute CVA bilaterally concerning for suggestive of embolic source, in setting of afib 3. Will cont coumadin as tolerated per Neurology recs 4. PT/OT/SLP 2. Hx seizures  1. Pt more awake and alert 2. Consider transitioning back to PO meds 3. HTN  1. Stable 2. Consider resuming home PO meds 4. HLD  1. Resume statin 5. Dementia  1. Stable 2. Would resume meds 6. Afib  1. Currently rate controlled 2. Cont monitor 3. Cont beta blocker 7. DVT prophylaxis  1. Coumadin per above  Code Status: DNR Family Communication: Pt and wife in room (indicate person spoken with, relationship, and if by phone, the number) Disposition Plan: Pending - poss return to snf?   Consultants:  Neurology  HPI/Subjective: No acute events noted overnight  Objective: Filed Vitals:   07/28/13 1444 07/28/13 1713 07/28/13 2123 07/29/13 0435  BP: 138/92 152/92 159/117 141/101  Pulse: 84 88 89 97  Temp: 97.8 F (36.6 C) 98.3 F (36.8 C) 97.8 F (36.6 C) 98.2 F (36.8 C)  TempSrc: Oral Oral Oral Axillary  Resp: 22 20 21 20   SpO2: 100% 100% 100% 99%    Intake/Output Summary (Last 24 hours) at 07/29/13 0836 Last data filed at 07/29/13 0442  Gross per 24 hour  Intake      0 ml  Output   1100 ml  Net  -1100 ml   There were no vitals filed for this visit.  Exam:  General:  Awake, in nad  Cardiovascular: regular, s1, s2  Respiratory: normal resp effort, no wheezing  Abdomen: soft, nondistended  Musculoskeletal: perfused, no clubbing   Data Reviewed: Basic Metabolic Panel:  Recent Labs Lab 07/27/13 1105 07/28/13 0335  NA 137 136  K 3.6 4.2  CL 100 101  CO2 22 24  GLUCOSE 121* 96  BUN 9 10  CREATININE 0.77 0.73  CALCIUM 9.5 8.6   Liver Function  Tests:  Recent Labs Lab 07/27/13 1105 07/28/13 0335  AST 24 31  ALT 19 15  ALKPHOS 103 82  BILITOT 0.5 0.4  PROT 8.4* 7.1  ALBUMIN 3.6 3.0*   No results found for this basename: LIPASE, AMYLASE,  in the last 168 hours No results found for this basename: AMMONIA,  in the last 168 hours CBC:  Recent Labs Lab 07/27/13 1105 07/28/13 0335  WBC 12.1* 10.8*  NEUTROABS 10.3*  --   HGB 13.6 12.2*  HCT 39.0 35.8*  MCV 92.2 92.5  PLT 266 268   Cardiac Enzymes:  Recent Labs Lab 07/27/13 1107  TROPONINI <0.30   BNP (last 3 results) No results found for this basename: PROBNP,  in the last 8760 hours CBG:  Recent Labs Lab 07/27/13 1626 07/28/13 1149  GLUCAP 101* 113*    No results found for this or any previous visit (from the past 240 hour(s)).   Studies: Ct Head Wo Contrast  07/27/2013   CLINICAL DATA:  Decreased level of consciousness. Stroke 1 week ago. Increasing confusion.  EXAM: CT HEAD WITHOUT CONTRAST  TECHNIQUE: Contiguous axial images were obtained from the base of the skull through the vertex without intravenous contrast.  COMPARISON:  Brain MRI 07/19/2013 and head CT 07/18/2012  FINDINGS: Hypoattenuation with loss of gray-white differentiation in the medial right temporal and right occipital lobes corresponds to acute PCA infarct  described on recent MRI. A small amount of hyperattenuation within the region of infarct may reflect a small amount of petechial hemorrhage or preserved gray matter. There is minimal mass effect on the occipital horn of the right lateral ventricle. There is no midline shift.  There is new hypoattenuation measuring approximately 3 x 3 cm involving the left parietal lobe consistent with new infarct (series 2, image 24). There is moderate generalized cerebral atrophy, advanced for age. Periventricular white matter hypodensities are similar to the prior exam and consistent with advanced chronic small vessel ischemic disease. There is no  extra-axial fluid collection. Orbits are unremarkable. Visualized paranasal sinuses and mastoid air cells are clear.  IMPRESSION: 1. New, acute left parietal infarct. 2. Evolving right PCA infarct. These results were called by telephone at the time of interpretation on 07/27/2013 at 11:03 AM to Dr. Samuel Jester , who verbally acknowledged these results.   Electronically Signed   By: Sebastian Ache   On: 07/27/2013 11:05   Mr Brain Wo Contrast  07/27/2013   CLINICAL DATA:  Recent right hemispheric infarct.  Seizures.  EXAM: MRI HEAD WITHOUT CONTRAST  TECHNIQUE: Multiplanar, multiecho pulse sequences of the brain and surrounding structures were obtained without intravenous contrast.  COMPARISON:  07/27/2013 CT.  07/19/2013 and 06/25/2013 MR.  FINDINGS: Present examination is limited to 4 motion degraded sequences. Patient was not able to complete remainder of examination.  Subacute large partially hemorrhagic infarct medial right temporal lobe, right occipital lobe and right thalamus.  New moderate size infarct centered at the right periatrial region involving posterior right temporal lobe, right occipital lobe extending toward junction with the right parietal lobe.  New moderate size left periatrial infarct centered at the left parietal lobe.  New small lateral posterior left thalamic infarct.  New medial left temporal lobe -occipital lobe infarct.  Although it is possible that seizure activity could contribute to the abnormality involving the medial left temporal lobe, remainder of findings are more suggestive of result of acute infarction rather than result of seizure activity.  Global atrophy.  Ventricular prominence without change.  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  Although the major intracranial vascular structures appear patent at the level of the sella, branch vessel analysis is limited.  IMPRESSION: Several new infarcts noted as detailed above.  These results were  called by telephone at the time of interpretation on 07/27/2013 at 2:58 PM to Dr. Samuel Jester , who verbally acknowledged these results.   Electronically Signed   By: Bridgett Larsson M.D.   On: 07/27/2013 15:01   Dg Chest Portable 1 View  07/27/2013   CLINICAL DATA:  Acute mental status changes, hypertension  EXAM: PORTABLE CHEST - 1 VIEW  COMPARISON:  07/18/2013  FINDINGS: Low lung volumes. An area of increased density projects within the left lung base with blunting of the costophrenic angle. Cardiac silhouette is enlarged. Patient is status post median sternotomy and coronary artery bypass grafting. The osseous structures are unremarkable.  IMPRESSION: Atelectasis versus infiltrate left lung base likely component of a small effusion.   Electronically Signed   By: Salome Holmes M.D.   On: 07/27/2013 11:44    Scheduled Meds: . levETIRAcetam  500 mg Intravenous Q12H  . metoprolol  5 mg Intravenous Q6H  . phenobarbital  32.4 mg Oral BID  . sodium chloride  3 mL Intravenous Q12H  . valproate sodium  250 mg Intravenous Q12H  . Warfarin - Pharmacist Dosing Inpatient  Does not apply q1800   Continuous Infusions: . 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 If 7PM-7AM, please contact night-coverage at [email or web address], password [facility] 07/29/2013, 8:36 AM  LOS: 2 days

## 2013-07-29 NOTE — Progress Notes (Signed)
Stroke Team Progress Note  HISTORY [name] is a 67 y.o. male history of atrial fibrillation on Coumadin, acute stroke on 07/18/2013 treated with TPA, hypertension, hyperlipidemia and dementia who was noted to be more confused and complaining of total loss of vision acutely. Patient was discharged to a skilled nursing facility following his recent admission. Stroke on 07/18/2013 involved his right occipital region and patient had left visual loss and left-sided weakness. CT scan of his head 07/27/2013 showed an area of acute infarction involving his left parietal region, in addition to evolving changes in his recent stroke involving the right occipital region with possible petechial hemorrhaging. INR was 1.64 on admission.  LSN: 07/26/2013, evening, unclear time.  tPA Given: No: TPA given on 07/18/2013.  MRankin: 4  SUBJECTIVE There no family members present this morning. The patient appears more alert. He attempts to speak; however, most of what he says is nonsensical. Occasionally he will say yes and all right. He follows occasional commands.  OBJECTIVE Most recent Vital Signs: Filed Vitals:   07/28/13 1713 07/28/13 2123 07/29/13 0435 07/29/13 0930  BP: 152/92 159/117 141/101 144/89  Pulse: 88 89 97 86  Temp: 98.3 F (36.8 C) 97.8 F (36.6 C) 98.2 F (36.8 C) 98.2 F (36.8 C)  TempSrc: Oral Oral Axillary Oral  Resp: 20 21 20 20   SpO2: 100% 100% 99% 97%   CBG (last 3)   Recent Labs  07/27/13 1626 07/28/13 1149  GLUCAP 101* 113*    IV Fluid Intake:   . sodium chloride 75 mL/hr at 07/28/13 2129    MEDICATIONS  . atorvastatin  40 mg Oral q1800  . carvedilol  3.125 mg Oral BID WC  . divalproex  250 mg Oral Daily  . escitalopram  20 mg Oral Daily  . folic acid  1 mg Oral q morning - 10a  . [START ON 07/30/2013] galantamine  16 mg Oral Q breakfast  . levETIRAcetam  500 mg Oral Q12H  . Memantine HCl ER  28 mg Oral Daily  . NIFEdipine  60 mg Oral Daily  . phenobarbital   32.4 mg Oral BID  . QUEtiapine  25 mg Oral QHS  . sodium chloride  3 mL Intravenous Q12H  . Warfarin - Pharmacist Dosing Inpatient   Does not apply q1800   PRN:  acetaminophen, acetaminophen, morphine injection  Diet:  Dysphagia 2 with nectar thick liquids Activity:  Up with assistance DVT Prophylaxis:  Warfarin  CLINICALLY SIGNIFICANT STUDIES Basic Metabolic Panel:   Recent Labs Lab 07/27/13 1105 07/28/13 0335  NA 137 136  K 3.6 4.2  CL 100 101  CO2 22 24  GLUCOSE 121* 96  BUN 9 10  CREATININE 0.77 0.73  CALCIUM 9.5 8.6   Liver Function Tests:   Recent Labs Lab 07/27/13 1105 07/28/13 0335  AST 24 31  ALT 19 15  ALKPHOS 103 82  BILITOT 0.5 0.4  PROT 8.4* 7.1  ALBUMIN 3.6 3.0*   CBC:   Recent Labs Lab 07/27/13 1105 07/28/13 0335  WBC 12.1* 10.8*  NEUTROABS 10.3*  --   HGB 13.6 12.2*  HCT 39.0 35.8*  MCV 92.2 92.5  PLT 266 268   Coagulation:   Recent Labs Lab 07/27/13 1105 07/28/13 0335 07/29/13 0503  LABPROT 19.0* 23.2* 28.8*  INR 1.64* 2.14* 2.83*   Cardiac Enzymes:   Recent Labs Lab 07/27/13 1107  TROPONINI <0.30   Urinalysis:   Recent Labs Lab 07/27/13 1348  COLORURINE YELLOW  LABSPEC 1.013  PHURINE 7.5  GLUCOSEU NEGATIVE  HGBUR TRACE*  BILIRUBINUR NEGATIVE  KETONESUR 40*  PROTEINUR 30*  UROBILINOGEN 1.0  NITRITE NEGATIVE  LEUKOCYTESUR NEGATIVE   Lipid Panel    Component Value Date/Time   CHOL 137 07/19/2013 0353   TRIG 79 07/19/2013 0353   HDL 54 07/19/2013 0353   CHOLHDL 2.5 07/19/2013 0353   VLDL 16 07/19/2013 0353   LDLCALC 67 07/19/2013 0353   HgbA1C  Lab Results  Component Value Date   HGBA1C 5.9* 07/19/2013    Urine Drug Screen:     Component Value Date/Time   LABOPIA NONE DETECTED 06/25/2013 1400   COCAINSCRNUR NONE DETECTED 06/25/2013 1400   LABBENZ NONE DETECTED 06/25/2013 1400   AMPHETMU NONE DETECTED 06/25/2013 1400   THCU NONE DETECTED 06/25/2013 1400   LABBARB POSITIVE* 06/25/2013 1400    Alcohol  Level: No results found for this basename: ETH,  in the last 168 hours  Ct Head Wo Contrast 07/27/2013     1. New, acute left parietal infarct. 2. Evolving right PCA infarct.    Mr Brain Wo Contrast 07/27/2013    Subacute large partially hemorrhagic infarct medial right temporal lobe right occipital lobe and right thalamus.  New moderate size infarct centered at the right periatrial region involving posterior right temporal lobe, right occipital lobe  extending toward junction with the right parietal lobe.  New moderate size left periatrial infarct centered at the left parietal lobe.  New small lateral posterior left thalamic infarct.  New medial left temporal lobe -occipital lobe infarct. Impression: Several new infarcts noted as detailed above.       Dg Chest Portable 1 View 07/27/2013    Atelectasis versus infiltrate left lung base likely component of a small effusion.        MRA of the brain    2D Echocardiogram  ejection fraction 50-55%. No cardiac source of emboli was identified  Carotid Doppler  The vertebral arteries appear patent with antegrade flow. Findings consistent with 1-39 percent stenosis involving the right internal carotid artery and the left internal carotid artery.    EKG   atrial fibrillation ventricular response 98 beats per minute  Therapy Recommendations - skilled nursing facility recommended  Physical Exam  Neurologic Examination:  The patient is more alert today. He follows some commands. He moves all extremities spontaneously. He attempts to speak however for the most part it is nonsensical. Pupils were unequal, left greater than right. Left pupil reacted to light. Reaction of right pupil to light was equivocal.  Extraocular movements could not be tested Patient was not alert enough to assess visual fields.  No facial weakness was noted.  Motor exam - the patient moves all extremities spontaneously. Deep tendon reflexes were 2+ and symmetrical.   Plantar responses on the left was extensor on the right mute.   ASSESSMENT [name] is a 67 y.o. male presenting with confusion and visual disturbance. TPA was not given as the patient had a previous acute stroke on 07/18/2013 and received TPA at that time. The patient has also been on Coumadin for atrial fibrillation although his admission INR was subtherapeutic at 1.64. Imaging confirms multiple infarcts as noted above which are felt to be embolic secondary to atrial fibrillation. On warfarin prior to admission. Now on warfarin for secondary stroke prevention. Patient with resultant altered mental status and left hemiparesis. Work up underway.   Atrial fibrillation  Previous strokes - recently treated with TPA on 07/18/2013  Seizure disorder - on  Keppra, phenobarbital, and Depakote. Phenobarbital dose was decreased yesterday to match home dose.  Hypertension history  Family history of stroke  Coronary artery disease  History of dementia  Hyperlipidemia history - cholesterol 137 LDL 67 this admission  Hospital day # 2  TREATMENT/PLAN  Continue warfarin for secondary stroke prevention. INR 2.83 today 07/29/2013  Skilled nursing facility recommended by the therapists (the patient had been at [facility] to admission )  Not a candidate for newer anticoagulants secondary to medication interactions.  Appears more alert on the lower dose of phenobarbital.  Delton See PA-C [facility] Pager [phone] 07/29/2013, 12:14 PM  I evaluated and examined patient, reviewed records, labs and imaging, and agree with note and plan. More awake today. Able to communicate better. Continue coumadin. Discussed with family.   Suanne Marker, MD 07/29/2013, 5:11 PM Certified in Neurology, Neurophysiology and Neuroimaging [facility] - Stroke Team  Please refer to [email or web address] for on-call Stroke MD

## 2013-07-29 NOTE — Progress Notes (Signed)
ANTICOAGULATION CONSULT NOTE - Follow-Up Consult  Pharmacy Consult for Warfarin Indication: CVA  No Known Allergies Patient Measurements: Weight 87.1 kg on 07/24/13 Height 175cm on 07/24/13 Vital Signs: Temp: 98.5 F (36.9 C) (12/14 1302) Temp src: Oral (12/14 1302) BP: 115/79 mmHg (12/14 1302) Pulse Rate: 100 (12/14 1302) Labs:  Recent Labs  07/27/13 1105 07/27/13 1107 07/28/13 0335 07/29/13 0503  HGB 13.6  --  12.2*  --   HCT 39.0  --  35.8*  --   PLT 266  --  268  --   LABPROT 19.0*  --  23.2* 28.8*  INR 1.64*  --  2.14* 2.83*  CREATININE 0.77  --  0.73  --   TROPONINI  --  <0.30  --   --    The CrCl is unknown because both a height and weight (above a minimum accepted value) are required for this calculation.  Medical History: Past Medical History  Diagnosis Date  . Hypertension   . Seizures     partial and generalized, poor compliance  . Myocardial infarction   . Unspecified hereditary and idiopathic peripheral neuropathy   . Vitamin B12 deficiency   . High blood pressure   . Ataxia   . Thoracic or lumbosacral neuritis or radiculitis, unspecified   . Coronary atherosclerosis of artery bypass graft   . Memory loss   . Generalized convulsive epilepsy without mention of intractable epilepsy   . Obstructive sleep apnea on CPAP   . Weakness of left side of body     S/P CVA  . Stroke     Multiple  . Stroke     received TPA 07/18/2013  . Atrial fibrillation   . Dementia with behavioral disturbance   . Dysphagia    Medications:  Coumadin 5 mg po every evening per [facility].  Assessment: 67 YOM on coumadin for recent CVA  and atrial fibrillation in ED with AMS. Coumadin to continue per pharmacy dosing.   CT shows acute L parietal infarct with resolving R parietal infarct.  Plan per Dr. Johnna Acosta progress note is to continue Coumadin as tolerated per Neurology recommendations.   INR remains therapeutic and has trended up to 2.83.  No bleeding noted.    Goal of Therapy:  INR 2-3 Monitor platelets by anticoagulation protocol: Yes   Plan:  1. Coumadin 3 mg po x 1 tonight. 2. Continue daily PT/INR. 3. Monitor for s/s of bleeding    Vinnie Level, PharmD.  Clinical Pharmacist Pager [phone]

## 2013-07-30 ENCOUNTER — Encounter (HOSPITAL_COMMUNITY): Payer: Self-pay | Admitting: General Practice

## 2013-07-30 DIAGNOSIS — F0280 Dementia in other diseases classified elsewhere without behavioral disturbance: Secondary | ICD-10-CM

## 2013-07-30 DIAGNOSIS — I6340 Cerebral infarction due to embolism of unspecified cerebral artery: Secondary | ICD-10-CM

## 2013-07-30 DIAGNOSIS — F0281 Dementia in other diseases classified elsewhere with behavioral disturbance: Secondary | ICD-10-CM

## 2013-07-30 LAB — PROTIME-INR: Prothrombin Time: 23.1 seconds — ABNORMAL HIGH (ref 11.6–15.2)

## 2013-07-30 MED ORDER — ENSURE PUDDING PO PUDG
1.0000 | Freq: Two times a day (BID) | ORAL | Status: DC
  Administered 2013-07-30 (×2): 1 via ORAL

## 2013-07-30 MED ORDER — WARFARIN SODIUM 4 MG PO TABS
4.0000 mg | ORAL_TABLET | Freq: Once | ORAL | Status: AC
  Administered 2013-07-30: 4 mg via ORAL
  Filled 2013-07-30: qty 1

## 2013-07-30 MED ORDER — GALANTAMINE HYDROBROMIDE ER 16 MG PO CP24: 16.0000 mg | ORAL_CAPSULE | Freq: Every day | ORAL | Status: DC

## 2013-07-30 NOTE — Progress Notes (Signed)
Stroke Team Progress Note  HISTORY [name] is a 67 y.o. male history of atrial fibrillation on Coumadin, acute stroke on 07/18/2013 treated with TPA, hypertension, hyperlipidemia and dementia who was noted to be more confused and complaining of total loss of vision acutely. Patient was discharged to a skilled nursing facility following his recent admission. Stroke on 07/18/2013 involved his right occipital region and patient had left visual loss and left-sided weakness. CT scan of his head 07/27/2013 showed an area of acute infarction involving his left parietal region, in addition to evolving changes in his recent stroke involving the right occipital region with possible petechial hemorrhaging. INR was 1.64 on admission. He was not a tPA candidate as he had a stroke within 90 days (TPA given on 07/18/2013.)  SUBJECTIVE There is a male family member at the bedside this am.  OBJECTIVE Most recent Vital Signs: Filed Vitals:   07/30/13 0202 07/30/13 0512 07/30/13 0630 07/30/13 1000  BP: 152/96 173/95 149/95 119/77  Pulse: 82 108 94 115  Temp: 99.3 F (37.4 C) 97.7 F (36.5 C)  97.9 F (36.6 C)  TempSrc: Oral Oral  Oral  Resp: 18 18  20   SpO2: 97% 97%  94%   CBG (last 3)   Recent Labs  07/27/13 1626 07/28/13 1149  GLUCAP 101* 113*    IV Fluid Intake:   . sodium chloride 75 mL/hr at 07/30/13 0329    MEDICATIONS  . atorvastatin  40 mg Oral q1800  . carvedilol  3.125 mg Oral BID WC  . divalproex  250 mg Oral Daily  . escitalopram  20 mg Oral Daily  . feeding supplement (ENSURE)  1 Container Oral BID BM  . folic acid  1 mg Oral q morning - 10a  . [START ON 07/31/2013] galantamine  16 mg Oral Q breakfast  . levETIRAcetam  500 mg Oral Q12H  . Memantine HCl ER  28 mg Oral Daily  . NIFEdipine  60 mg Oral Daily  . phenobarbital  32.4 mg Oral BID  . QUEtiapine  25 mg Oral QHS  . sodium chloride  3 mL Intravenous Q12H  . warfarin  4 mg Oral ONCE-1800  . Warfarin - Pharmacist  Dosing Inpatient   Does not apply q1800   PRN:  acetaminophen, acetaminophen, morphine injection  Diet:  Dysphagia 2 with nectar thick liquids Activity:  Up with assistance DVT Prophylaxis:  Warfarin  CLINICALLY SIGNIFICANT STUDIES Basic Metabolic Panel:   Recent Labs Lab 07/27/13 1105 07/28/13 0335  NA 137 136  K 3.6 4.2  CL 100 101  CO2 22 24  GLUCOSE 121* 96  BUN 9 10  CREATININE 0.77 0.73  CALCIUM 9.5 8.6   Liver Function Tests:   Recent Labs Lab 07/27/13 1105 07/28/13 0335  AST 24 31  ALT 19 15  ALKPHOS 103 82  BILITOT 0.5 0.4  PROT 8.4* 7.1  ALBUMIN 3.6 3.0*   CBC:   Recent Labs Lab 07/27/13 1105 07/28/13 0335  WBC 12.1* 10.8*  NEUTROABS 10.3*  --   HGB 13.6 12.2*  HCT 39.0 35.8*  MCV 92.2 92.5  PLT 266 268   Coagulation:   Recent Labs Lab 07/27/13 1105 07/28/13 0335 07/29/13 0503 07/30/13 0500  LABPROT 19.0* 23.2* 28.8* 23.1*  INR 1.64* 2.14* 2.83* 2.12*   Cardiac Enzymes:   Recent Labs Lab 07/27/13 1107  TROPONINI <0.30   Urinalysis:   Recent Labs Lab 07/27/13 1348  COLORURINE YELLOW  LABSPEC 1.013  PHURINE 7.5  GLUCOSEU  NEGATIVE  HGBUR TRACE*  BILIRUBINUR NEGATIVE  KETONESUR 40*  PROTEINUR 30*  UROBILINOGEN 1.0  NITRITE NEGATIVE  LEUKOCYTESUR NEGATIVE   Lipid Panel    Component Value Date/Time   CHOL 137 07/19/2013 0353   TRIG 79 07/19/2013 0353   HDL 54 07/19/2013 0353   CHOLHDL 2.5 07/19/2013 0353   VLDL 16 07/19/2013 0353   LDLCALC 67 07/19/2013 0353   HgbA1C  Lab Results  Component Value Date   HGBA1C 5.9* 07/19/2013    Urine Drug Screen:     Component Value Date/Time   LABOPIA NONE DETECTED 06/25/2013 1400   COCAINSCRNUR NONE DETECTED 06/25/2013 1400   LABBENZ NONE DETECTED 06/25/2013 1400   AMPHETMU NONE DETECTED 06/25/2013 1400   THCU NONE DETECTED 06/25/2013 1400   LABBARB POSITIVE* 06/25/2013 1400    Alcohol Level: No results found for this basename: ETH,  in the last 168 hours  Ct Head Wo  Contrast 07/27/2013     1. New, acute left parietal infarct. 2. Evolving right PCA infarct.   Mr Brain Wo Contrast 07/27/2013    Subacute large partially hemorrhagic infarct medial right temporal lobe right occipital lobe and right thalamus. New moderate size infarct centered at the right periatrial region involving posterior right temporal lobe, right occipital lobe extending toward junction with the right parietal lobe. New moderate size left periatrial infarct centered at the left parietal lobe. New small lateral posterior left thalamic infarct. New medial left temporal lobe -occipital lobe infarct.Impression: Several new infarcts.   Dg Chest Portable 1 View 07/27/2013    Atelectasis versus infiltrate left lung base likely component of a small effusion.        2D Echocardiogram  ejection fraction 50-55%. No cardiac source of emboli was identified  Carotid Doppler  The vertebral arteries appear patent with antegrade flow. Findings consistent with 1-39 percent stenosis involving the right internal carotid artery and the left internal carotid artery.    EKG   atrial fibrillation ventricular response 98 beats per minute  Therapy Recommendations - skilled nursing facility recommended  Physical Exam  Neurologic Examination:  The patient is more alert today. He follows some commands. He moves all extremities spontaneously. He attempts to speak  But shorts enetences. Pupils were unequal, left greater than right. Left pupil reacted to light. Reaction of right pupil to light was equivocal.  Extraocular movements could not be tested Patient was not alert enough to assess visual fields.  No facial weakness was noted.  Motor exam - the patient moves all extremities spontaneously against gravity with left mild weakness. Deep tendon reflexes were 2+ and symmetrical.  Plantar responses on the left was extensor on the right mute.   ASSESSMENT [name] is a 67 y.o. male presenting with  confusion and visual disturbance. TPA was not given as the patient had a previous acute stroke on 07/18/2013 and received TPA at that time. The patient has also been on Coumadin for atrial fibrillation although his admission INR was subtherapeutic at 1.64. Imaging confirms new bilateral, anterior and posterior circulation infarcts - infarcts are felt to be embolic secondary to known atrial fibrillation. On warfarin prior to admission. Now on warfarin for secondary stroke prevention. Patient with resultant altered mental status and left hemiparesis. Work up completed   Atrial fibrillation  Previous strokes - recently treated with TPA on 07/18/2013  Seizure disorder - on Keppra, phenobarbital, and Depakote. Phenobarbital dose was decreased yesterday to match home dose.  Hypertension history  Family history of  stroke  Coronary artery disease  History of dementia  Hyperlipidemia history - cholesterol 137 LDL 67 this admission  Hospital day # 3  TREATMENT/PLAN  Nothing further to add from the stroke perspective. This was discussed with pt and male family member.   Continue warfarin for secondary stroke prevention. Not a candidate for NOACs due to interaction of them with his phenobarbital.  Recommend return to skilled nursing facility - [facility]   D/w wife and answered questions  No further stroke workup indicated. Patient has a 10-15% risk of having another stroke over the next year, the highest risk is within 2 weeks of the most recent stroke/TIA (risk of having a stroke following a stroke or TIA is the same). Ongoing risk factor control by Primary Care Physician Stroke Service will sign off. Please call should any needs arise. Follow up with Dr. Pearlean Brownie, Stroke Clinic, in 2 months.  Annie Main, MSN, RN, ANVP-BC, ANP-BC, [facility] Pager: [phone] 07/30/2013 4:07 PM  I have personally obtained a history, examined the patient, evaluated imaging  results, and formulated the assessment and plan of care. I agree with the above. Delia Heady, MD

## 2013-07-30 NOTE — Clinical Social Work Psychosocial (Signed)
Clinical Social Work Department BRIEF PSYCHOSOCIAL ASSESSMENT 07/30/2013  Patient:  MATTI, MINNEY     Account Number:  [id number]     Admit date:  07/27/2013  Clinical Social Worker:  Sherre Lain  Date/Time:  07/30/2013 03:29 PM  Referred by:  Physician  Date Referred:  07/30/2013 Referred for  SNF Placement   Other Referral:   none.   Interview type:  Family Other interview type:   Spoke to pt's wife via phone.    PSYCHOSOCIAL DATA Living Status:  FACILITY Admitted from facility:  [facility], [place] Level of care:  Skilled Nursing Facility Primary support name:  Hamlet Lasecki Primary support relationship to patient:  SPOUSE Degree of support available:   Strong support system, pt's wife cares for pt.    CURRENT CONCERNS Current Concerns  Post-Acute Placement   Other Concerns:   none.    SOCIAL WORK ASSESSMENT / PLAN CSW spoke to pt's wife regarding pt returning to [facility] once discharged from [facility]. Pt's wife stated that she was willing to give SNF placement another chance, but ultimately wanted to bring her husband home and care for him. Pt's wife currently has another ill family member and is juggling both her husband and the family member. Pt's wife agreeable to pt returning to [facility]. CSW contacted [facility] who stated pt was able to return.   Assessment/plan status:  Psychosocial Support/Ongoing Assessment of Needs Other assessment/ plan:   none.   Information/referral to community resources:   Pt to return to previous SNF.    PATIENTS/FAMILYS RESPONSE TO PLAN OF CARE: Pt's was agreeable to plan, but not pleased with him returning to SNF.       Darlyn Chamber, LCSWA Clinical Social Worker [phone]
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
## 2013-07-30 NOTE — Progress Notes (Signed)
Occupational Therapy Note  07/30/13 1200  OT Visit Information  Last OT Received On 07/30/13  Assistance Needed +2  PT/OT/SLP Co-Evaluation/Treatment Yes  OT goals addressed during session ADL's and self-care  History of Present Illness Found to have acute infarcts to L temporal, occipital and thalamic areas. [name] from last week.  OT Time Calculation  OT Start Time 1152  OT Stop Time 1201  OT Time Calculation (min) 9 min  Precautions  Precautions Fall  Precaution Comments suspect pt to have R sided neglect  ADL  Eating/Feeding Performed;Maximal assistance  Where Assessed - Eating/Feeding Bed level  Grooming Performed;Wash/dry face;+1 Total assistance  Where Assessed - Grooming Supine, head of bed up  ADL Comments Pt supine in bed on OT arrival. Pt keeping eyes closed during session. Attempted HOH for washing face but pt resistive.      Cognition  Arousal/Alertness Lethargic  Behavior During Therapy Flat affect  Overall Cognitive Status Impaired/Different from baseline  Area of Impairment Orientation;Attention;Following commands;Safety/judgement;Awareness;Problem solving  Orientation Level Disoriented to;Place;Time;Situation  Current Attention Level Focused  Memory Decreased short-term memory  Following Commands Follows one step commands inconsistently  Safety/Judgement Decreased awareness of safety  Awareness Intellectual  Problem Solving Slow processing;Requires verbal cues;Requires tactile cues;Difficulty sequencing  OT - End of Session  Activity Tolerance Patient limited by lethargy  Patient left in bed;with call bell/phone within reach  OT Assessment/Plan  OT Plan Discharge plan remains appropriate  OT Frequency Min 2X/week  Follow Up Recommendations SNF  OT Equipment None recommended by OT  Acute Rehab OT Goals  Patient Stated Goal None stated.    OT Goal Formulation With family  Time For Goal Achievement 08/11/13  Potential to Achieve Goals  Good  ADL Goals  Pt Will Perform Grooming with min assist;sitting  Pt Will Perform Upper Body Bathing with min assist;sitting  Additional ADL Goal #1 Pt will perform bed mobility with min assist as precursor for EOB ADLs.  Additional ADL Goal #2 Pt will be able to perform static sitting balance task >5 minutes at min assist level as precursor for EOB ADLs.  OT General Charges  $OT Visit 1 Procedure  OT Treatments  $Self Care/Home Management  8-22 mins   07/30/2013 Cipriano Mile OTR/L Pager [phone] Office [phone]

## 2013-07-30 NOTE — Progress Notes (Signed)
ANTICOAGULATION CONSULT NOTE - Follow-Up Consult  Pharmacy Consult for Warfarin Indication: CVA  No Known Allergies Patient Measurements: Weight 87.1 kg on 07/24/13 Height 175cm on 07/24/13 Vital Signs: Temp: 97.7 F (36.5 C) (12/15 0512) Temp src: Oral (12/15 0512) BP: 149/95 mmHg (12/15 0630) Pulse Rate: 94 (12/15 0630) Labs:  Recent Labs  07/27/13 1105 07/27/13 1107 07/28/13 0335 07/29/13 0503 07/30/13 0500  HGB 13.6  --  12.2*  --   --   HCT 39.0  --  35.8*  --   --   PLT 266  --  268  --   --   LABPROT 19.0*  --  23.2* 28.8* 23.1*  INR 1.64*  --  2.14* 2.83* 2.12*  CREATININE 0.77  --  0.73  --   --   TROPONINI  --  <0.30  --   --   --    The CrCl is unknown because both a height and weight (above a minimum accepted value) are required for this calculation.  Medical History: Past Medical History  Diagnosis Date  . Hypertension   . Seizures     partial and generalized, poor compliance  . Myocardial infarction   . Unspecified hereditary and idiopathic peripheral neuropathy   . Vitamin B12 deficiency   . High blood pressure   . Ataxia   . Thoracic or lumbosacral neuritis or radiculitis, unspecified   . Coronary atherosclerosis of artery bypass graft   . Memory loss   . Generalized convulsive epilepsy without mention of intractable epilepsy   . Obstructive sleep apnea on CPAP   . Weakness of left side of body     S/P CVA  . Stroke     Multiple  . Stroke     received TPA 07/18/2013  . Atrial fibrillation   . Dementia with behavioral disturbance   . Dysphagia    Medications:  Coumadin 5 mg po every evening per [facility].  Assessment: 67 YOM on coumadin for recent CVA  and atrial fibrillation in ED with AMS. Coumadin to continue per pharmacy dosing.   CT shows acute L parietal infarct with resolving R parietal infarct.  Plan per Dr. Johnna Acosta progress note is to continue Coumadin as tolerated per Neurology recommendations.   INR remains therapeutic  at 2.12 today.  No bleeding noted.   Goal of Therapy:  INR 2-3 Monitor platelets by anticoagulation protocol: Yes   Plan:  1. Coumadin 4 mg po x 1 tonight. (Lower dose due to large increase on 12/14, if tolerates may go back to home dose tomorrow).  2. Continue daily PT/INR. 3. Monitor for s/s of bleeding  4. MD- Please consider discontinuing IV fluids (NS @ 10ml/hr) from [facility]. No BMET since 12/13.   Thank you for this consult,  Link Snuffer, PharmD, BCPS Clinical Pharmacist [phone] 07/30/2013, 8:30 AM

## 2013-07-30 NOTE — Progress Notes (Signed)
Report given to [facility].

## 2013-07-30 NOTE — Progress Notes (Signed)
Physical Therapy Treatment Patient Details Name: [name] MRN: [id number] DOB: 09-02-1945 Today's Date: 07/30/2013 Time: 4098-1191 PT Time Calculation (min): 18 min  PT Assessment / Plan / Recommendation  History of Present Illness Found to have acute infarcts to L temporal, occipital and thalamic areas. [name] from last week.   PT Comments   Patient not participating well today in therapy. Open his eyes for seconds while EOB, responding to his wife. Otherwise very limited session. Wife asking questions regarding prognosis, deferred to MD. Will continue with POC. Continue to recommend SNF for ongoing Physical Therapy.     Follow Up Recommendations  SNF     Does the patient have the potential to tolerate intense rehabilitation     Barriers to Discharge        Equipment Recommendations  None recommended by PT    Recommendations for Other Services    Frequency Min 3X/week   Progress towards PT Goals Progress towards PT goals: Not progressing toward goals - comment  Plan Current plan remains appropriate    Precautions / Restrictions Precautions Precautions: Fall Precaution Comments: suspect pt to have R sided neglect   Pertinent Vitals/Pain no apparent distress    Mobility  Bed Mobility Supine to Sit: 1: +2 Total assist;HOB elevated Supine to Sit: Patient Percentage: 10% Sit to Supine: 1: +2 Total assist;HOB flat Sit to Supine: Patient Percentage: 10% Scooting to HOB: 1: +2 Total assist Scooting to [facility]: Patient Percentage: 0% Details for Bed Mobility Assistance: Patient not assisting much at all this session. Pushing posterior very hard requiring +2 for balance EOB    Exercises     PT Diagnosis:    PT Problem List:   PT Treatment Interventions:     PT Goals (current goals can now be found in the care plan section) Acute Rehab PT Goals Patient Stated Goal: None stated.    Visit Information  Last PT Received On: 07/30/13 Assistance  Needed: +2 OT goals addressed during session: ADL's and self-care History of Present Illness: Found to have acute infarcts to L temporal, occipital and thalamic areas. [name] from last week.    Subjective Data  Patient Stated Goal: None stated.     Cognition  Cognition Arousal/Alertness: Lethargic Behavior During Therapy: Flat affect Overall Cognitive Status: Impaired/Different from baseline Area of Impairment: Orientation;Attention;Following commands;Safety/judgement;Awareness;Problem solving Orientation Level: Disoriented to;Place;Time;Situation Current Attention Level: Focused Memory: Decreased short-term memory Following Commands: Follows one step commands inconsistently Safety/Judgement: Decreased awareness of safety Awareness: Intellectual Problem Solving: Slow processing;Requires verbal cues;Requires tactile cues;Difficulty sequencing    Balance  Static Sitting Balance Static Sitting - Balance Support: Feet supported;Feet unsupported Static Sitting - Level of Assistance: 1: +2 Total assist  End of Session PT - End of Session Activity Tolerance: Patient limited by lethargy Patient left: in bed;with bed alarm set;with call bell/phone within reach Nurse Communication: Mobility status   GP     Fredrich Birks 07/30/2013, 2:40 PM 07/30/2013 Fredrich Birks PTA [phone] pager [phone] office

## 2013-07-30 NOTE — Progress Notes (Signed)
Pt d/c via ambulance to SNF.
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
## 2013-07-31 ENCOUNTER — Other Ambulatory Visit: Payer: Self-pay | Admitting: *Deleted

## 2013-07-31 MED ORDER — PHENOBARBITAL 32.4 MG PO TABS: 32.4000 mg | ORAL_TABLET | Freq: Two times a day (BID) | ORAL | Status: AC

## 2013-08-01 ENCOUNTER — Non-Acute Institutional Stay (SKILLED_NURSING_FACILITY): Payer: Medicare Other | Admitting: Internal Medicine

## 2013-08-01 ENCOUNTER — Encounter: Payer: Self-pay | Admitting: Internal Medicine

## 2013-08-01 ENCOUNTER — Other Ambulatory Visit (HOSPITAL_COMMUNITY): Payer: Self-pay

## 2013-08-01 ENCOUNTER — Ambulatory Visit (HOSPITAL_COMMUNITY): Payer: Medicare Other

## 2013-08-01 DIAGNOSIS — I6340 Cerebral infarction due to embolism of unspecified cerebral artery: Secondary | ICD-10-CM

## 2013-08-01 DIAGNOSIS — Z7901 Long term (current) use of anticoagulants: Secondary | ICD-10-CM

## 2013-08-01 DIAGNOSIS — IMO0002 Reserved for concepts with insufficient information to code with codable children: Secondary | ICD-10-CM

## 2013-08-01 DIAGNOSIS — I4891 Unspecified atrial fibrillation: Secondary | ICD-10-CM

## 2013-08-01 DIAGNOSIS — R569 Unspecified convulsions: Secondary | ICD-10-CM

## 2013-08-01 DIAGNOSIS — F0280 Dementia in other diseases classified elsewhere without behavioral disturbance: Secondary | ICD-10-CM

## 2013-08-01 DIAGNOSIS — Z9989 Dependence on other enabling machines and devices: Secondary | ICD-10-CM

## 2013-08-01 DIAGNOSIS — I674 Hypertensive encephalopathy: Secondary | ICD-10-CM

## 2013-08-01 DIAGNOSIS — F0150 Vascular dementia without behavioral disturbance: Secondary | ICD-10-CM

## 2013-08-01 DIAGNOSIS — G4733 Obstructive sleep apnea (adult) (pediatric): Secondary | ICD-10-CM

## 2013-08-01 DIAGNOSIS — Z5181 Encounter for therapeutic drug level monitoring: Secondary | ICD-10-CM
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
## 2013-08-07 ENCOUNTER — Encounter: Payer: Self-pay | Admitting: Internal Medicine

## 2013-08-07 ENCOUNTER — Non-Acute Institutional Stay (SKILLED_NURSING_FACILITY): Payer: Medicare Other | Admitting: Internal Medicine

## 2013-08-07 ENCOUNTER — Other Ambulatory Visit: Payer: Self-pay | Admitting: *Deleted

## 2013-08-07 DIAGNOSIS — J690 Pneumonitis due to inhalation of food and vomit: Secondary | ICD-10-CM

## 2013-08-07 DIAGNOSIS — I69391 Dysphagia following cerebral infarction: Secondary | ICD-10-CM

## 2013-08-07 DIAGNOSIS — I69991 Dysphagia following unspecified cerebrovascular disease: Secondary | ICD-10-CM

## 2013-08-07 DIAGNOSIS — I6340 Cerebral infarction due to embolism of unspecified cerebral artery: Secondary | ICD-10-CM

## 2013-08-07 DIAGNOSIS — IMO0002 Reserved for concepts with insufficient information to code with codable children: Secondary | ICD-10-CM

## 2013-08-07 DIAGNOSIS — I4891 Unspecified atrial fibrillation: Secondary | ICD-10-CM

## 2013-08-07 MED ORDER — TRAMADOL HCL 50 MG PO TABS: ORAL_TABLET | ORAL | Status: AC

## 2013-08-07 NOTE — Progress Notes (Signed)
Patient ID: [name], male   DOB: 21-Apr-1946, 67 y.o.   MRN: [id number]  Location:  [facility]  Provider:  Elmarie Shiley L. Renato Gails, D.O., C.M.D.  Code Status: DNR  Chief Complaint  Patient presents with  . Acute Visit    lethargy    HPI:  67 yo black male who is here for STR s/p hospitalization with multiple embolic strokes (has had three recent hospital stays for this) was seen due to increased lethargy when his wife was here on Sunday (2 days ago).  Apparently, she was noted to be feeding him, then he was coughing and congested.  Staff say he has otherwise been alert, joking and laughing with his family when they have been visiting him.  He is participating in PT, OT, ST.  He's been afebrile.  Review of Systems:  Review of Systems  Constitutional: Positive for malaise/fatigue.  HENT: Positive for congestion.   Eyes: Positive for blurred vision and double vision.  Respiratory: Positive for cough. Negative for shortness of breath.   Cardiovascular: Negative for chest pain.  Gastrointestinal: Negative for abdominal pain and constipation.  Genitourinary: Negative for dysuria.  Musculoskeletal: Negative for falls.  Neurological: Positive for sensory change and focal weakness.       Since stroke, not new today  Psychiatric/Behavioral: Positive for memory loss.    Medications: Patient's Medications  New Prescriptions   No medications on file  Previous Medications   CARVEDILOL (COREG) 3.125 MG TABLET    Take 3.125 mg by mouth 2 (two) times daily with a meal.   DIVALPROEX (DEPAKOTE ER) 250 MG 24 HR TABLET    Take 250 mg by mouth daily.    ESCITALOPRAM (LEXAPRO) 20 MG TABLET    Take 20 mg by mouth daily.   FOLIC ACID (FOLVITE) 1 MG TABLET    Take 1 mg by mouth every morning.    GALANTAMINE (RAZADYNE ER) 16 MG 24 HR CAPSULE    Take 16 mg by mouth daily with breakfast.    LEVETIRACETAM (KEPPRA) 500 MG TABLET    Take 500 mg by mouth every 12 (twelve) hours.    MEMANTINE  HCL ER (NAMENDA XR) 28 MG CP24    Take 28 mg by mouth daily.   NIFEDIPINE (PROCARDIA-XL/ADALAT CC) 60 MG 24 HR TABLET    Take 60 mg by mouth daily.    PHENOBARBITAL (LUMINAL) 32.4 MG TABLET    Take 1 tablet (32.4 mg total) by mouth 2 (two) times daily.   SIMVASTATIN (ZOCOR) 80 MG TABLET    Take 80 mg by mouth at bedtime.   TRAMADOL (ULTRAM) 50 MG TABLET    Take one tablet by mouth every 6 hours as needed for pain   WARFARIN (COUMADIN) 5 MG TABLET    Take 5 mg by mouth every evening.  Modified Medications   No medications on file  Discontinued Medications   QUETIAPINE (SEROQUEL) 25 MG TABLET    Take 25 mg by mouth at bedtime. For 7 days then discontinue. Started 07/24/13    Physical Exam: Filed Vitals:   08/07/13 1037  BP: 123/71  Pulse: 84  Temp: 97.8 F (36.6 C)  Resp: 17  Height: 5\' 9"  (1.753 m)  Weight: 181 lb (82.101 kg)  SpO2: 98%  Physical Exam  Constitutional: He appears well-developed and well-nourished. No distress.  Black male  Cardiovascular: Intact distal pulses.   irreg irreg  Pulmonary/Chest: Effort normal.  Wet sounding coarse rhonchi   Abdominal: Soft. Bowel sounds  are normal. He exhibits no mass. There is no tenderness.  Musculoskeletal:  Hemiplegia with gaze preference  Neurological: He is alert.  Dysarthric speech with some expressive aphasia also;  New visual deficit since stroke  Skin: Skin is warm and dry.  Psychiatric: He has a normal mood and affect.     Labs reviewed: Basic Metabolic Panel:  Recent Labs  91/47/82 1112 07/27/13 1105 07/28/13 0335  NA 137 137 136  K 3.8 3.6 4.2  CL 101 100 101  CO2 26 22 24   GLUCOSE 87 121* 96  BUN 10 9 10   CREATININE 0.88 0.77 0.73  CALCIUM 9.3 9.5 8.6    Liver Function Tests:  Recent Labs  07/18/13 1112 07/27/13 1105 07/28/13 0335  AST 13 24 31   ALT 13 19 15   ALKPHOS 101 103 82  BILITOT 0.3 0.5 0.4  PROT 7.2 8.4* 7.1  ALBUMIN 3.4* 3.6 3.0*    CBC:  Recent Labs  06/25/13 0515  07/18/13 1112 07/27/13 1105 07/28/13 0335  WBC 11.2* 9.9 12.1* 10.8*  NEUTROABS 7.5 6.7 10.3*  --   HGB 15.2 12.8* 13.6 12.2*  HCT 44.8 38.0* 39.0 35.8*  MCV 93.5 93.6 92.2 92.5  PLT 180 179 266 268  07/25/13:  inr 1.45 07/27/13:  inr 1.76 07/31/13:  inr 2.28  see previous note for recent imaging studies  Assessment/Plan 1. Aspiration pneumonitis -suspected etiology--seems this has not progressed to pneumonia due to lack of fever or other signs of acute infection at this time--suspect he had a brief episode of hypoxia in the context of the event that caused his lethargy -his family is gradually moving toward hospice care for him  2. 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 Dysphagia due to recent cerebrovascular accident -aspiration precautions -working with speech therapy and on specialized diet -suspect when his family was feeding him, it may not have been consistent with the diet recommendation and this is why he aspirated, but they have been educated on this -also, if his goals of care are revised, he may eat what they bring  4. Atrial fibrillation -continues on coumadin with iNR goal 2-3, f/u INR 12/24   Family/ staff Communication: seen with unit supervisor   Goals of care: DNR  Labs/tests ordered:  INR 12/24 (on 5mg  coumadin)

## 2013-08-14 ENCOUNTER — Non-Acute Institutional Stay (SKILLED_NURSING_FACILITY): Payer: Medicare Other | Admitting: Internal Medicine

## 2013-08-14 ENCOUNTER — Encounter: Payer: Self-pay | Admitting: Internal Medicine

## 2013-08-14 DIAGNOSIS — I69991 Dysphagia following unspecified cerebrovascular disease: Secondary | ICD-10-CM

## 2013-08-14 DIAGNOSIS — F0280 Dementia in other diseases classified elsewhere without behavioral disturbance: Secondary | ICD-10-CM

## 2013-08-14 DIAGNOSIS — I669 Occlusion and stenosis of unspecified cerebral artery: Secondary | ICD-10-CM

## 2013-08-14 DIAGNOSIS — F0150 Vascular dementia without behavioral disturbance: Secondary | ICD-10-CM

## 2013-08-14 DIAGNOSIS — I674 Hypertensive encephalopathy: Secondary | ICD-10-CM

## 2013-08-14 DIAGNOSIS — R627 Adult failure to thrive: Secondary | ICD-10-CM

## 2013-08-14 DIAGNOSIS — R5381 Other malaise: Secondary | ICD-10-CM

## 2013-08-14 DIAGNOSIS — I69391 Dysphagia following cerebral infarction: Secondary | ICD-10-CM

## 2013-08-14 NOTE — Progress Notes (Signed)
Patient ID: [name], male   DOB: [birth year], 67 y.o.   MRN: [id number]  Location:  [facility] Provider:  Elmarie Shiley L. Renato Gails, D.O., C.M.D.  Code Status:  DNR  Chief Complaint  Patient presents with  . Acute Visit    wife is considering taking him home with hospice care    HPI:  67 yo black male here for rehab after a couple of debilitating strokes.  He now has difficulty with dysphagia, diplopia, dysarthria, hemiparesis and hemineglect.  He is much more confused and eating less.  He is aspirating despite the best precautions.  His wife would like for him to be able to spend his last days at home.  She feels that with the help of hospice, she can take care of him at home.  She had a few questions that were answered.  She understands his prognosis at this time.    Review of Systems:  Review of Systems  Unable to perform ROS: mental acuity   Medications: Patient's Medications  New Prescriptions   No medications on file  Previous Medications   CARVEDILOL (COREG) 3.125 MG TABLET    Take 3.125 mg by mouth 2 (two) times daily with a meal.   DIVALPROEX (DEPAKOTE ER) 250 MG 24 HR TABLET    Take 250 mg by mouth daily.    ESCITALOPRAM (LEXAPRO) 20 MG TABLET    Take 20 mg by mouth daily.   FOLIC ACID (FOLVITE) 1 MG TABLET    Take 1 mg by mouth every morning.    GALANTAMINE (RAZADYNE ER) 16 MG 24 HR CAPSULE    Take 16 mg by mouth daily with breakfast.    LEVETIRACETAM (KEPPRA) 500 MG TABLET    Take 500 mg by mouth every 12 (twelve) hours.    MEMANTINE HCL ER (NAMENDA XR) 28 MG CP24    Take 28 mg by mouth daily.   NIFEDIPINE (PROCARDIA-XL/ADALAT CC) 60 MG 24 HR TABLET    Take 60 mg by mouth daily.    PHENOBARBITAL (LUMINAL) 32.4 MG TABLET    Take 1 tablet (32.4 mg total) by mouth 2 (two) times daily.   SIMVASTATIN (ZOCOR) 80 MG TABLET    Take 80 mg by mouth at bedtime.   TRAMADOL (ULTRAM) 50 MG TABLET    Take one tablet by mouth every 6 hours as needed for pain   WARFARIN  (COUMADIN) 5 MG TABLET    Take 5 mg by mouth every evening.  Modified Medications   No medications on file  Discontinued Medications   No medications on file    Physical Exam: Filed Vitals:   08/14/13 1443  BP: 170/82  Pulse: 82  Temp: 96.4 F (35.8 C)  Resp: 22  Height: 5\' 9"  (1.753 m)  Weight: 175 lb (79.379 kg)  SpO2: 98%   Physical Exam  Constitutional: No distress.  Eyes: Pupils are equal, round, and reactive to light.  Cardiovascular:  irreg irreg  Pulmonary/Chest: Effort normal and breath sounds normal.  Few rhonchi  Abdominal: Soft. Bowel sounds are normal. He exhibits no distension and no mass. There is no tenderness.  Neurological: He is alert. A cranial nerve deficit is present.  Very confused, hemineglect, dysarthric, some aphasia also, hemiparesis, facial droop, diplopia, difficulty following commands overall and due to hemineglect and gaze preference  Skin: Skin is warm and dry.  Psychiatric:  Altered affect   Labs reviewed: Basic Metabolic Panel:  Recent Labs  40/98/11 1112 07/27/13 1105 07/28/13 0335  NA 137 137 136  K 3.8 3.6 4.2  CL 101 100 101  CO2 26 22 24   GLUCOSE 87 121* 96  BUN 10 9 10   CREATININE 0.88 0.77 0.73  CALCIUM 9.3 9.5 8.6    Liver Function Tests:  Recent Labs  07/18/13 1112 07/27/13 1105 07/28/13 0335  AST 13 24 31   ALT 13 19 15   ALKPHOS 101 103 82  BILITOT 0.3 0.5 0.4  PROT 7.2 8.4* 7.1  ALBUMIN 3.4* 3.6 3.0*    CBC:  Recent Labs  06/25/13 0515 07/18/13 1112 07/27/13 1105 07/28/13 0335  WBC 11.2* 9.9 12.1* 10.8*  NEUTROABS 7.5 6.7 10.3*  --   HGB 15.2 12.8* 13.6 12.2*  HCT 44.8 38.0* 39.0 35.8*  MCV 93.5 93.6 92.2 92.5  PLT 180 179 266 268   Assessment/Plan 1. Embolic cerebrovascular disease -cause of his overall decline with 2 recent strokes and poor recovery -hospice order written (see hpi for details)  2. Dysphagia due to recent cerebrovascular accident -aspiration precautions in place,  working with ST, has already had at least one episode of aspiration  3. Mixed Alzheimer's and vascular dementia -had some baseline memory loss prior to the strokes and has had significant decline cognitively since then  4. Systolic hypertension with cerebrovascular disease -bp remains a bit above goal despite aggressive bp regimen at this point, cont to monitor as goals of care have changed  5. Physical deconditioning -was here for rehab but had another stroke and further decline -having more trouble participating in therapy and his wife has decided she wants to take him home  6. Adult failure to thrive syndrome -due to #1 and 3 -d/c home with home hospice (if meets all qualifications when assessment done)   Family/ staff Communication: had meeting with pt and his wife along with unit supervisor discussing goals of care today  Goals of care: his wife requests hospice care for him and he agrees--she is his hcpoa  Labs/tests ordered:  [facility] referral placed

## 2013-08-21 ENCOUNTER — Encounter: Payer: Self-pay | Admitting: Internal Medicine

## 2013-08-21 ENCOUNTER — Non-Acute Institutional Stay (SKILLED_NURSING_FACILITY): Payer: Medicare Other | Admitting: Internal Medicine

## 2013-08-21 DIAGNOSIS — F0150 Vascular dementia without behavioral disturbance: Secondary | ICD-10-CM

## 2013-08-21 DIAGNOSIS — I69991 Dysphagia following unspecified cerebrovascular disease: Secondary | ICD-10-CM

## 2013-08-21 DIAGNOSIS — I6340 Cerebral infarction due to embolism of unspecified cerebral artery: Secondary | ICD-10-CM

## 2013-08-21 DIAGNOSIS — I4891 Unspecified atrial fibrillation: Secondary | ICD-10-CM

## 2013-08-21 DIAGNOSIS — I69391 Dysphagia following cerebral infarction: Secondary | ICD-10-CM

## 2013-08-21 DIAGNOSIS — G309 Alzheimer's disease, unspecified: Secondary | ICD-10-CM

## 2013-08-21 DIAGNOSIS — G4733 Obstructive sleep apnea (adult) (pediatric): Secondary | ICD-10-CM

## 2013-08-21 DIAGNOSIS — R627 Adult failure to thrive: Secondary | ICD-10-CM

## 2013-08-21 DIAGNOSIS — F0280 Dementia in other diseases classified elsewhere without behavioral disturbance: Secondary | ICD-10-CM

## 2013-08-21 DIAGNOSIS — G40309 Generalized idiopathic epilepsy and epileptic syndromes, not intractable, without status epilepticus: Secondary | ICD-10-CM

## 2013-08-21 DIAGNOSIS — Z9989 Dependence on other enabling machines and devices: Secondary | ICD-10-CM

## 2013-08-21 DIAGNOSIS — R6251 Failure to thrive (child): Secondary | ICD-10-CM
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
## 2013-08-31 ENCOUNTER — Telehealth: Payer: Self-pay | Admitting: *Deleted

## 2013-08-31 NOTE — Telephone Encounter (Signed)
Okey RegalCarol called about pt needing PT INR and coumadin adjustments.  Pt is seen here and is asking about our practice doing this.  (message forwarded to me from 08-30-13 on VM).  I relayed via VM that we do not normally do this.  Pts pcp is Mahima Pandey, pcp.   I gave her their # (care team).

## 2013-09-11 ENCOUNTER — Other Ambulatory Visit (HOSPITAL_COMMUNITY): Payer: Self-pay | Admitting: Internal Medicine

## 2013-10-04 ENCOUNTER — Ambulatory Visit: Payer: Medicare Other | Admitting: Neurology

## 2013-10-14 DEATH — deceased

## 2013-10-15 ENCOUNTER — Ambulatory Visit: Payer: PRIVATE HEALTH INSURANCE | Admitting: Nurse Practitioner

## 2013-12-30 ENCOUNTER — Encounter: Payer: Self-pay | Admitting: Internal Medicine

## 2013-12-30 DIAGNOSIS — I69391 Dysphagia following cerebral infarction: Secondary | ICD-10-CM | POA: Insufficient documentation

## 2014-01-16 ENCOUNTER — Encounter: Payer: Self-pay | Admitting: Internal Medicine

## 2014-05-11 IMAGING — CT CT ANGIO HEAD
1 of 9 series · 5 of 33 positions shown · IV contrast (CONTRAST)
Comparison: CT head without contrast 07/18/2013. MRI and MRA brain
06/25/2013.

CLINICAL DATA: Acute onset of left-sided weakness and slurred
speech. Multiple prior infarcts. Code stroke.

EXAM:
CT ANGIOGRAPHY HEAD
TECHNIQUE: Multidetector CT imaging of the head was performed using the
standard protocol during bolus administration of intravenous
contrast. Multiplanar CT image reconstructions including MIPs were
obtained to evaluate the vascular anatomy.
CONTRAST:  50 mL Omnipaque 350

[axial · axial · 0.45mm/px · z∈[+154,+261]mm · 5 of 161 slices shown]
[im 27/161  soft-tissue]
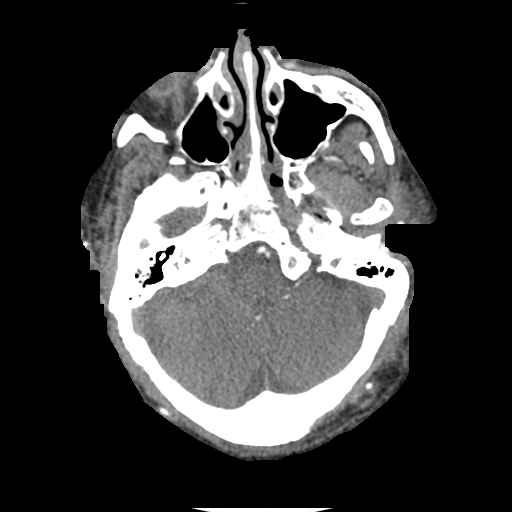
[im 54/161  bone]
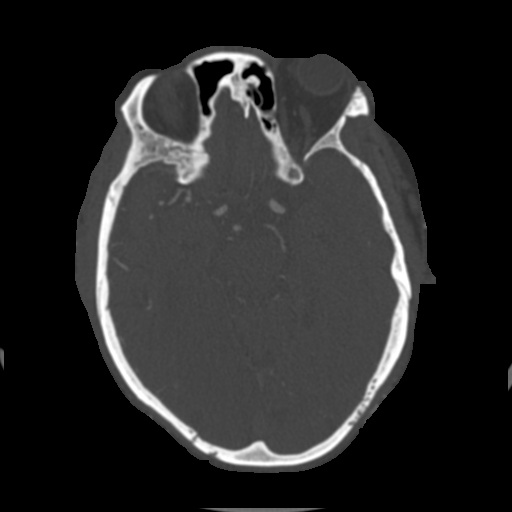
[im 81/161  soft-tissue]
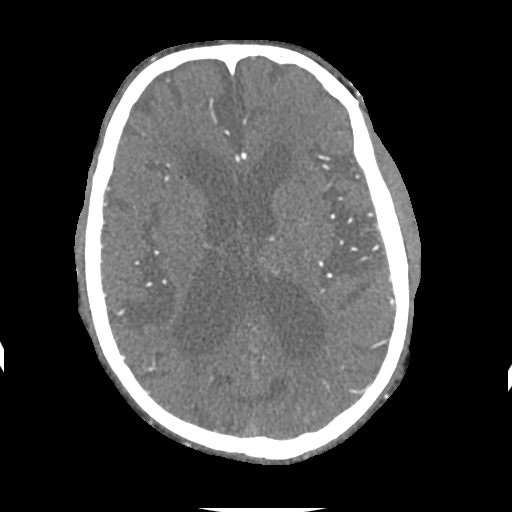
[im 107/161  bone]
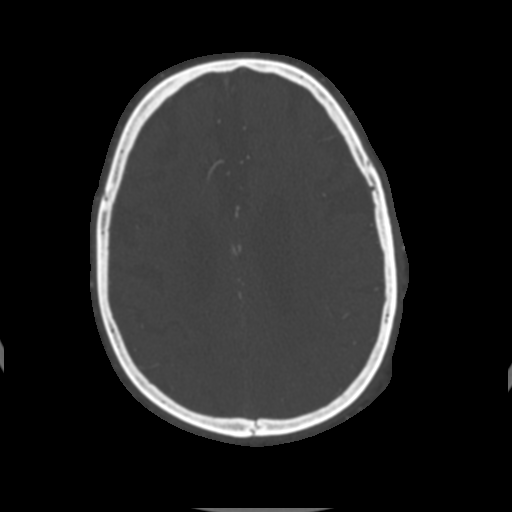
[im 134/161  soft-tissue]
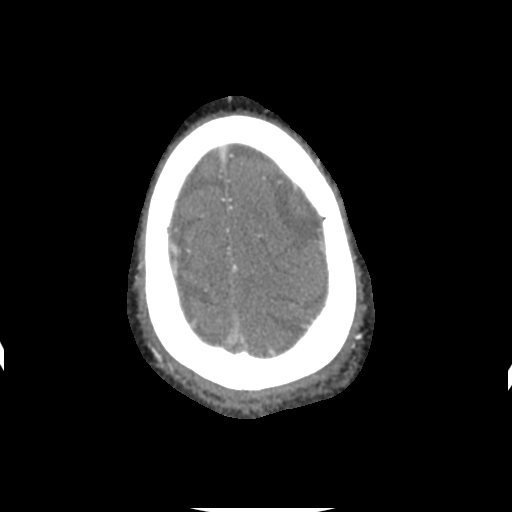

[5 of 33 positions shown; findings below may reference images not displayed]

FINDINGS: Remote lacunar infarcts are again noted within the basal ganglia
bilaterally. The source images demonstrate no acute cortical
infarct. The insular ribbon is intact. Basal ganglia are stable.
Moderate atrophy and diffuse white matter disease is again seen. No
pathologic enhancement is evident.

Dense atherosclerotic calcifications are present within the
cavernous carotid arteries bilaterally. Mild to moderate stenosis is
present on the right. The supra clinoid segments are intact
bilaterally. The right A1 segment is markedly hypoplastic. There is
mild narrowing of proximal left A1 segment. The anterior
communicating artery is patent. The A2 segments are within normal
limits bilaterally. The MCA bifurcations are normal.

Irregular atherosclerotic disease is present in the distal vertebral
arteries bilaterally with moderate to severe stenosis on the left.
There is mild focal ectasia just distal to the vertebrobasilar
junction. Mid basilar artery is tortuous. Both posterior cerebral
arteries originate from the basilar tip. There is a high-grade
stenosis in the proximal right PCA.

Review of the MIP images confirms the above findings.
IMPRESSION: 1. Similar appearance of diffuse atherosclerotic disease involving
the cavernous carotid arteries and distal vertebral arteries
bilaterally. The cavernous disease is worse on the right. The
vertebral disease is worse on the left.
2. Mild focal ectasia of the proximal basilar artery just distal to
the vertebrobasilar junction.
3. Moderate stenosis of the proximal posterior cerebral arteries
bilaterally with attenuation of distal branch vessels, right greater
than left.

## 2018-03-29 ENCOUNTER — Encounter: Payer: Self-pay | Admitting: Internal Medicine
